# Patient Record
Sex: Female | Born: 1970 | Race: Black or African American | Hispanic: No | Marital: Single | Smoking: Current every day smoker
Health system: Southern US, Community
[De-identification: ages and names within clinical notes are randomized; demographics above are authoritative.]

## PROBLEM LIST (undated history)

## (undated) DIAGNOSIS — R0600 Dyspnea, unspecified: Secondary | ICD-10-CM

## (undated) DIAGNOSIS — J40 Bronchitis, not specified as acute or chronic: Secondary | ICD-10-CM

## (undated) DIAGNOSIS — M1990 Unspecified osteoarthritis, unspecified site: Secondary | ICD-10-CM

## (undated) DIAGNOSIS — E7800 Pure hypercholesterolemia, unspecified: Secondary | ICD-10-CM

## (undated) DIAGNOSIS — J189 Pneumonia, unspecified organism: Secondary | ICD-10-CM

## (undated) DIAGNOSIS — I10 Essential (primary) hypertension: Secondary | ICD-10-CM

## (undated) DIAGNOSIS — K219 Gastro-esophageal reflux disease without esophagitis: Secondary | ICD-10-CM

## (undated) DIAGNOSIS — J45909 Unspecified asthma, uncomplicated: Secondary | ICD-10-CM

## (undated) DIAGNOSIS — Z720 Tobacco use: Secondary | ICD-10-CM

## (undated) DIAGNOSIS — K859 Acute pancreatitis without necrosis or infection, unspecified: Secondary | ICD-10-CM

## (undated) DIAGNOSIS — F32A Depression, unspecified: Secondary | ICD-10-CM

## (undated) DIAGNOSIS — J439 Emphysema, unspecified: Secondary | ICD-10-CM

---

## 2016-07-07 ENCOUNTER — Encounter (HOSPITAL_COMMUNITY): Payer: Self-pay | Admitting: *Deleted

## 2016-07-07 ENCOUNTER — Emergency Department (HOSPITAL_COMMUNITY)
Admission: EM | Admit: 2016-07-07 | Discharge: 2016-07-07 | Disposition: A | Discharge status: Home / Self Care | Payer: Self-pay | Attending: Emergency Medicine | Admitting: Emergency Medicine

## 2016-07-07 DIAGNOSIS — I10 Essential (primary) hypertension: Secondary | ICD-10-CM | POA: Insufficient documentation

## 2016-07-07 DIAGNOSIS — F17200 Nicotine dependence, unspecified, uncomplicated: Secondary | ICD-10-CM | POA: Insufficient documentation

## 2016-07-07 DIAGNOSIS — J019 Acute sinusitis, unspecified: Secondary | ICD-10-CM | POA: Insufficient documentation

## 2016-07-07 HISTORY — DX: Essential (primary) hypertension: I10

## 2016-07-07 HISTORY — DX: Bronchitis, not specified as acute or chronic: J40

## 2016-07-07 MED ORDER — GUAIFENESIN 100 MG/5ML PO LIQD: 100.0000 mg | ORAL | 0 refills | Status: DC | PRN

## 2016-07-07 MED ORDER — AMOXICILLIN-POT CLAVULANATE 875-125 MG PO TABS: 1.0000 | ORAL_TABLET | Freq: Two times a day (BID) | ORAL | 0 refills | Status: DC

## 2016-07-07 NOTE — ED Provider Notes (Signed)
[facility]-EMERGENCY DEPT Provider Note   CSN: [id number] Arrival date & time: 07/07/16  0800     History   Chief Complaint Chief Complaint  Patient presents with  . URI    HPI Robin Fischer is a 45 y.o. female.  Patient presents to the ED with a chief complaint of cough and cold symptoms for almost a week.  Patient states that the symptoms started out like a typical cold with runny nose, mild congestion, and sore throat.  She states that they have worsened to the point that she is coughing up green sputum and having thick discharge from her nose.  She denies any fevers or chills.  She has tried taking OTC medications with some relief.  She reports that her daughter was sick with the same.   The history is provided by the patient. No language interpreter was used.    Past Medical History:  Diagnosis Date  . Bronchitis   . Hypertension     There are no active problems to display for this patient.   History reviewed. No pertinent surgical history.  OB History    No data available       Home Medications    Prior to Admission medications   Not on File    Family History History reviewed. No pertinent family history.  Social History Social History  Substance Use Topics  . Smoking status: Current Every Day Smoker  . Smokeless tobacco: Not on file  . Alcohol use Yes     Comment: beer     Allergies   Review of patient's allergies indicates no known allergies.   Review of Systems Review of Systems  Constitutional: Positive for chills. Negative for fever.  HENT: Positive for postnasal drip, rhinorrhea, sinus pressure, sneezing and sore throat.   Respiratory: Positive for cough. Negative for shortness of breath.   Cardiovascular: Negative for chest pain.  Gastrointestinal: Negative for abdominal pain, constipation, diarrhea, nausea and vomiting.  Genitourinary: Negative for dysuria.     Physical Exam Updated Vital Signs BP 148/90 (BP Location: Left Arm)    Pulse 83   Temp 98.4 F (36.9 C) (Oral)   Resp 19   SpO2 99%   Physical Exam Physical Exam  Constitutional: Pt  is oriented to person, place, and time. Appears well-developed and well-nourished. No distress.  HENT:  Head: Normocephalic and atraumatic.  Ears: Bilateral ears are remarkable for thick congestion behind TMs Nose: Mucosal edema and moderate rhinorrhea present. No epistaxis. Right sinus exhibits no maxillary sinus tenderness and no frontal sinus tenderness. Left sinus exhibits no maxillary sinus tenderness and no frontal sinus tenderness.  Mouth/Throat: Uvula is midline and mucous membranes are normal. Mucous membranes are not pale and not cyanotic. No oropharyngeal exudate, posterior oropharyngeal edema, posterior oropharyngeal erythema or tonsillar abscesses.  Eyes: Conjunctivae are normal. Pupils are equal, round, and reactive to light.  Neck: Normal range of motion and full passive range of motion without pain.  Cardiovascular: Normal rate and intact distal pulses.   Pulmonary/Chest: Effort normal and breath sounds normal. No stridor.  Clear and equal breath sounds without focal wheezes, rhonchi, rales  Abdominal: Soft. Bowel sounds are normal. There is no tenderness.  Musculoskeletal: Normal range of motion.  Lymphadenopathy:    Pthas no cervical adenopathy.  Neurological: Pt is alert and oriented to person, place, and time.  Skin: Skin is warm and dry. No rash noted. Pt is not diaphoretic.  Psychiatric: Normal mood and affect.  Nursing note and vitals  reviewed.     ED Treatments / Results  Labs (all labs ordered are listed, but only abnormal results are displayed) Labs Reviewed - No data to display  EKG  EKG Interpretation None       Radiology No results found.  Procedures Procedures (including critical care time)  Medications Ordered in ED Medications - No data to display   Initial Impression / Assessment and Plan / ED Course  I have reviewed the  triage vital signs and the nursing notes.  Pertinent labs & imaging results that were available during my care of the patient were reviewed by me and considered in my medical decision making (see chart for details).  Clinical Course    Patient with sinus tenderness, thick productive cough.  Sick x almost a week.  Will prescribe augmentin for possible development of sinusitis given length of symptoms.  Final Clinical Impressions(s) / ED Diagnoses   Final diagnoses:  Acute sinusitis, recurrence not specified, unspecified location    New Prescriptions New Prescriptions   AMOXICILLIN-CLAVULANATE (AUGMENTIN) 875-125 MG TABLET    Take 1 tablet by mouth every 12 (twelve) hours.   GUAIFENESIN (ROBITUSSIN) 100 MG/5ML LIQUID    Take 5-10 mLs (100-200 mg total) by mouth every 4 (four) hours as needed for cough.     Roxy HorsemanRobert Seddrick Flax, PA-C 07/07/16 0840    Charlynne Panderavid Hsienta Yao, MD 07/07/16 [phone]

## 2016-07-07 NOTE — ED Triage Notes (Signed)
Pt reports cough, cold and congestion x 3 weeks. Has productive cough with green sputum.

## 2016-07-07 NOTE — ED Notes (Addendum)
Pt states cold symptoms started 4-5 days ago. Pt complains of congested cough, pain in chest when coughing, pressure between eyes. Pt states her daughter had the same symptoms a week ago.

## 2016-11-30 ENCOUNTER — Ambulatory Visit (HOSPITAL_COMMUNITY)
Admission: EM | Admit: 2016-11-30 | Discharge: 2016-11-30 | Disposition: A | Discharge status: Home / Self Care | Payer: Self-pay | Attending: Family Medicine | Admitting: Family Medicine

## 2016-11-30 ENCOUNTER — Encounter (HOSPITAL_COMMUNITY): Payer: Self-pay | Admitting: *Deleted

## 2016-11-30 DIAGNOSIS — S39012A Strain of muscle, fascia and tendon of lower back, initial encounter: Secondary | ICD-10-CM

## 2016-11-30 DIAGNOSIS — S161XXA Strain of muscle, fascia and tendon at neck level, initial encounter: Secondary | ICD-10-CM

## 2016-11-30 HISTORY — DX: Unspecified asthma, uncomplicated: J45.909

## 2016-11-30 MED ORDER — CYCLOBENZAPRINE HCL 10 MG PO TABS: 10.0000 mg | ORAL_TABLET | Freq: Two times a day (BID) | ORAL | 0 refills | Status: DC | PRN

## 2016-11-30 MED ORDER — NAPROXEN 500 MG PO TABS: 500.0000 mg | ORAL_TABLET | Freq: Two times a day (BID) | ORAL | 0 refills | Status: DC
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
## 2017-01-18 ENCOUNTER — Ambulatory Visit (INDEPENDENT_AMBULATORY_CARE_PROVIDER_SITE_OTHER): Payer: Medicaid Other | Admitting: Physician Assistant

## 2017-01-18 ENCOUNTER — Encounter (INDEPENDENT_AMBULATORY_CARE_PROVIDER_SITE_OTHER): Payer: Self-pay | Admitting: Physician Assistant

## 2017-01-18 VITALS — BP 150/99 | HR 81 | Temp 98.2°F | Ht 67.0 in | Wt 211.4 lb

## 2017-01-18 DIAGNOSIS — M545 Low back pain, unspecified: Secondary | ICD-10-CM

## 2017-01-18 DIAGNOSIS — I10 Essential (primary) hypertension: Secondary | ICD-10-CM

## 2017-01-18 DIAGNOSIS — M25551 Pain in right hip: Secondary | ICD-10-CM

## 2017-01-18 DIAGNOSIS — Z114 Encounter for screening for human immunodeficiency virus [HIV]: Secondary | ICD-10-CM

## 2017-01-18 DIAGNOSIS — R319 Hematuria, unspecified: Secondary | ICD-10-CM

## 2017-01-18 DIAGNOSIS — M25561 Pain in right knee: Secondary | ICD-10-CM

## 2017-01-18 DIAGNOSIS — G8929 Other chronic pain: Secondary | ICD-10-CM

## 2017-01-18 DIAGNOSIS — E781 Pure hyperglyceridemia: Secondary | ICD-10-CM | POA: Diagnosis not present

## 2017-01-18 DIAGNOSIS — R300 Dysuria: Secondary | ICD-10-CM | POA: Diagnosis not present

## 2017-01-18 LAB — POCT URINALYSIS DIPSTICK
BILIRUBIN UA: NEGATIVE
GLUCOSE UA: NEGATIVE
Ketones, UA: NEGATIVE
NITRITE UA: NEGATIVE
Protein, UA: NEGATIVE
Spec Grav, UA: 1.025 (ref 1.010–1.025)
Urobilinogen, UA: 0.2 E.U./dL
pH, UA: 5.5 (ref 5.0–8.0)

## 2017-01-18 LAB — POCT URINE PREGNANCY: Preg Test, Ur: NEGATIVE

## 2017-01-18 MED ORDER — HYDROCHLOROTHIAZIDE 25 MG PO TABS: 25.0000 mg | ORAL_TABLET | Freq: Every day | ORAL | 1 refills | Status: DC

## 2017-01-18 MED ORDER — CIPROFLOXACIN HCL 500 MG PO TABS: 500.0000 mg | ORAL_TABLET | Freq: Two times a day (BID) | ORAL | 0 refills | Status: AC

## 2017-01-18 MED ORDER — ASPIRIN EC 81 MG PO TBEC
81.0000 mg | DELAYED_RELEASE_TABLET | Freq: Every day | ORAL | 3 refills | Status: DC
Start: 2017-01-18 — End: 2017-11-24

## 2017-01-18 MED ORDER — MELOXICAM 15 MG PO TABS
15.0000 mg | ORAL_TABLET | Freq: Every day | ORAL | 0 refills | Status: DC
Start: 2017-01-18 — End: 2017-11-24
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
## 2017-01-18 NOTE — Patient Instructions (Signed)
Managing Your Hypertension Hypertension is commonly called high blood pressure. This is when the force of your blood pressing against the walls of your arteries is too strong. Arteries are blood vessels that carry blood from your heart throughout your body. Hypertension forces the heart to work harder to pump blood, and may cause the arteries to become narrow or stiff. Having untreated or uncontrolled hypertension can cause heart attack, stroke, kidney disease, and other problems. What are blood pressure readings? A blood pressure reading consists of a higher number over a lower number. Ideally, your blood pressure should be below 120/80. The first ("top") number is called the systolic pressure. It is a measure of the pressure in your arteries as your heart beats. The second ("bottom") number is called the diastolic pressure. It is a measure of the pressure in your arteries as the heart relaxes. What does my blood pressure reading mean? Blood pressure is classified into four stages. Based on your blood pressure reading, your health care provider may use the following stages to determine what type of treatment you need, if any. Systolic pressure and diastolic pressure are measured in a unit called mm Hg. Normal   Systolic pressure: below 120.  Diastolic pressure: below 80. Elevated   Systolic pressure: 120-129.  Diastolic pressure: below 80. Hypertension stage 1     Diastolic pressure: 80-89. Hypertension stage 2   Systolic pressure: 140 or above.  Diastolic pressure: 90 or above. What health risks are associated with hypertension? Managing your hypertension is an important responsibility. Uncontrolled hypertension can lead to:  A heart attack.  A stroke.  A weakened blood vessel (aneurysm).  Heart failure.  Kidney damage.  Eye damage.  Metabolic syndrome.  Memory and concentration problems. What changes can I make to manage my hypertension? Eating and drinking   Eat a  diet that is high in fiber and potassium, and low in salt (sodium), added sugar, and fat. An example eating plan is called the DASH (Dietary Approaches to Stop Hypertension) diet. To eat this way:  Eat plenty of fresh fruits and vegetables. Try to fill half of your plate at each meal with fruits and vegetables.  Eat whole grains, such as whole wheat pasta, brown rice, or whole grain bread. Fill about one quarter of your plate with whole grains.  Eat low-fat diary products.  Avoid fatty cuts of meat, processed or cured meats, and poultry with skin. Fill about one quarter of your plate with lean proteins such as fish, chicken without skin, beans, eggs, and tofu.  Avoid premade and processed foods. These tend to be higher in sodium, added sugar, and fat.     Lifestyle   Work with your health care provider to maintain a healthy body weight, or to lose weight. Ask what an ideal weight is for you.  Get at least 30 minutes of exercise that causes your heart to beat faster (aerobic exercise) most days of the week. Activities may include walking, swimming, or biking.       Monitoring   Monitor your blood pressure at home as told by your health care provider. Your personal target blood pressure may vary depending on your medical conditions, your age, and other factors.  Have your blood pressure checked regularly, as often as told by your health care provider. Working with your health care provider   Review all the medicines you take with your health care provider because there may be side effects or interactions.  Talk with your health   care provider about your diet, exercise habits, and other lifestyle factors that may be contributing to hypertension.  Visit your health care provider regularly. Your health care provider can help you create and adjust your plan for managing hypertension. Will I need medicine to control my blood pressure? Your health care provider may prescribe medicine if  lifestyle changes are not enough to get your blood pressure under control, and if:  Your systolic blood pressure is 130 or higher.  Your diastolic blood pressure is 80 or higher. Take medicines only as told by your health care provider. Follow the directions carefully. Blood pressure medicines must be taken as prescribed. The medicine does not work as well when you skip doses. Skipping doses also puts you at risk for problems. Contact a health care provider if:  You think you are having a reaction to medicines you have taken.  You have repeated (recurrent) headaches.  You feel dizzy.  You have swelling in your ankles.  You have trouble with your vision. Get help right away if:  You develop a severe headache or confusion.  You have unusual weakness or numbness, or you feel faint.  You have severe pain in your chest or abdomen.  You vomit repeatedly.  You have trouble breathing. Summary  Hypertension is when the force of blood pumping through your arteries is too strong. If this condition is not controlled, it may put you at risk for serious complications.  Your personal target blood pressure may vary depending on your medical conditions, your age, and other factors. For most people, a normal blood pressure is less than 120/80.  Hypertension is managed by lifestyle changes, medicines, or both. Lifestyle changes include weight loss, eating a healthy, low-sodium diet, exercising more, and limiting alcohol. This information is not intended to replace advice given to you by your health care provider. Make sure you discuss any questions you have with your health care provider. Document Released: 05/16/2012 Document Revised: 07/20/2016 Document Reviewed: 07/20/2016 Elsevier Interactive Patient Education  2017 [employer]  

## 2017-01-19 LAB — CBC WITH DIFFERENTIAL/PLATELET
BASOS ABS: 0 10*3/uL (ref 0.0–0.2)
Basos: 1 %
EOS (ABSOLUTE): 0.3 10*3/uL (ref 0.0–0.4)
Eos: 3 %
HEMATOCRIT: 44.5 % (ref 34.0–46.6)
Hemoglobin: 14.5 g/dL (ref 11.1–15.9)
Immature Grans (Abs): 0 10*3/uL (ref 0.0–0.1)
Immature Granulocytes: 0 %
LYMPHS ABS: 3.8 10*3/uL — AB (ref 0.7–3.1)
Lymphs: 45 %
MCH: 25 pg — AB (ref 26.6–33.0)
MCHC: 32.6 g/dL (ref 31.5–35.7)
MCV: 77 fL — ABNORMAL LOW (ref 79–97)
MONOCYTES: 5 %
MONOS ABS: 0.4 10*3/uL (ref 0.1–0.9)
NEUTROS ABS: 3.9 10*3/uL (ref 1.4–7.0)
Neutrophils: 46 %
Platelets: 293 10*3/uL (ref 150–379)
RBC: 5.79 x10E6/uL — ABNORMAL HIGH (ref 3.77–5.28)
RDW: 15.4 % (ref 12.3–15.4)
WBC: 8.4 10*3/uL (ref 3.4–10.8)

## 2017-01-19 LAB — LIPID PANEL
CHOL/HDL RATIO: 5.1 ratio — AB (ref 0.0–4.4)
Cholesterol, Total: 299 mg/dL — ABNORMAL HIGH (ref 100–199)
HDL: 59 mg/dL (ref >39)
LDL Calculated: 204 mg/dL — ABNORMAL HIGH (ref 0–99)
Triglycerides: 180 mg/dL — ABNORMAL HIGH (ref 0–149)
VLDL Cholesterol Cal: 36 mg/dL (ref 5–40)

## 2017-01-19 LAB — COMPREHENSIVE METABOLIC PANEL
ALBUMIN: 4.6 g/dL (ref 3.5–5.5)
ALK PHOS: 93 IU/L (ref 39–117)
ALT: 16 IU/L (ref 0–32)
AST: 16 IU/L (ref 0–40)
Albumin/Globulin Ratio: 1.8 (ref 1.2–2.2)
BILIRUBIN TOTAL: 0.2 mg/dL (ref 0.0–1.2)
BUN/Creatinine Ratio: 15 (ref 9–23)
BUN: 12 mg/dL (ref 6–24)
CHLORIDE: 103 mmol/L (ref 96–106)
CO2: 23 mmol/L (ref 18–29)
Calcium: 9.9 mg/dL (ref 8.7–10.2)
Creatinine, Ser: 0.78 mg/dL (ref 0.57–1.00)
GFR calc Af Amer: 105 mL/min/{1.73_m2} (ref >59)
GFR, EST NON AFRICAN AMERICAN: 91 mL/min/{1.73_m2} (ref >59)
GLOBULIN, TOTAL: 2.5 g/dL (ref 1.5–4.5)
Glucose: 94 mg/dL (ref 65–99)
POTASSIUM: 4.1 mmol/L (ref 3.5–5.2)
SODIUM: 143 mmol/L (ref 134–144)
Total Protein: 7.1 g/dL (ref 6.0–8.5)

## 2017-01-19 LAB — URINALYSIS

## 2017-01-19 LAB — HIV ANTIBODY (ROUTINE TESTING W REFLEX): HIV SCREEN 4TH GENERATION: NONREACTIVE

## 2017-01-20 ENCOUNTER — Other Ambulatory Visit (INDEPENDENT_AMBULATORY_CARE_PROVIDER_SITE_OTHER): Payer: Self-pay | Admitting: Physician Assistant

## 2017-01-20 DIAGNOSIS — E785 Hyperlipidemia, unspecified: Secondary | ICD-10-CM

## 2017-01-20 LAB — URINE CULTURE

## 2017-01-20 MED ORDER — ATORVASTATIN CALCIUM 40 MG PO TABS: 40.0000 mg | ORAL_TABLET | Freq: Every day | ORAL | 3 refills | Status: DC

## 2017-01-20 NOTE — Progress Notes (Signed)
Elevated LDL 204

## 2017-01-24 ENCOUNTER — Telehealth (INDEPENDENT_AMBULATORY_CARE_PROVIDER_SITE_OTHER): Payer: Self-pay | Admitting: Physician Assistant

## 2017-01-24 NOTE — Telephone Encounter (Signed)
Patient left voicemail stated her Ultrasound appt was canceled due to her insurance not covering it.  Please follow up with patient.

## 2017-01-25 ENCOUNTER — Ambulatory Visit (HOSPITAL_COMMUNITY): Payer: Medicaid Other

## 2017-02-15 ENCOUNTER — Ambulatory Visit (INDEPENDENT_AMBULATORY_CARE_PROVIDER_SITE_OTHER): Payer: Medicaid Other | Admitting: Physician Assistant

## 2017-03-11 ENCOUNTER — Encounter (HOSPITAL_COMMUNITY): Payer: Self-pay | Admitting: Emergency Medicine

## 2017-03-11 ENCOUNTER — Emergency Department (HOSPITAL_COMMUNITY)
Admission: EM | Admit: 2017-03-11 | Discharge: 2017-03-11 | Disposition: A | Discharge status: Home / Self Care | Payer: Medicaid Other | Attending: Emergency Medicine | Admitting: Emergency Medicine

## 2017-03-11 DIAGNOSIS — F17210 Nicotine dependence, cigarettes, uncomplicated: Secondary | ICD-10-CM | POA: Insufficient documentation

## 2017-03-11 DIAGNOSIS — N3000 Acute cystitis without hematuria: Secondary | ICD-10-CM

## 2017-03-11 DIAGNOSIS — Z79899 Other long term (current) drug therapy: Secondary | ICD-10-CM | POA: Insufficient documentation

## 2017-03-11 DIAGNOSIS — R102 Pelvic and perineal pain: Secondary | ICD-10-CM

## 2017-03-11 HISTORY — DX: Pure hypercholesterolemia, unspecified: E78.00

## 2017-03-11 LAB — URINALYSIS, ROUTINE W REFLEX MICROSCOPIC
BACTERIA UA: NONE SEEN
BILIRUBIN URINE: NEGATIVE
GLUCOSE, UA: NEGATIVE mg/dL
Ketones, ur: 5 mg/dL — AB
NITRITE: NEGATIVE
Protein, ur: 30 mg/dL — AB
SPECIFIC GRAVITY, URINE: 1.024 (ref 1.005–1.030)
Squamous Epithelial / LPF: NONE SEEN
pH: 5 (ref 5.0–8.0)

## 2017-03-11 LAB — WET PREP, GENITAL
Clue Cells Wet Prep HPF POC: NONE SEEN
SPERM: NONE SEEN
Trich, Wet Prep: NONE SEEN
YEAST WET PREP: NONE SEEN

## 2017-03-11 LAB — POC URINE PREG, ED: Preg Test, Ur: NEGATIVE

## 2017-03-11 MED ORDER — CEPHALEXIN 500 MG PO CAPS: 1000.0000 mg | ORAL_CAPSULE | Freq: Two times a day (BID) | ORAL | 0 refills | Status: DC

## 2017-03-11 NOTE — ED Provider Notes (Signed)
[facility]-EMERGENCY DEPT Provider Note   CSN: [id number] Arrival date & time: 03/11/17  1325     History   Chief Complaint Chief Complaint  Patient presents with  . Vaginal Pain    HPI Robin Fischer is a 46 y.o. female.  HPI Patient was vaginal pain for approximately one month. Sharp stabbing pains and burning pains. She denies pain with urination but then does quantify that things seem to hurt more after she urinates. She denies vaginal discharge or bleeding. No associated abdominal pain. Patient reports last sexual activity was approximately 1 year ago. Last visit to gynecologist approximately 3 years ago. Denies history of cervical cancer other GYN pathology. Past Medical History:  Diagnosis Date  . Asthma   . Bronchitis   . High cholesterol   . Hypertension     There are no active problems to display for this patient.   History reviewed. No pertinent surgical history.  OB History    No data available       Home Medications    Prior to Admission medications   Medication Sig Start Date End Date Taking? Authorizing Provider  aspirin EC 81 MG tablet Take 1 tablet (81 mg total) by mouth daily. 01/18/17   Loletta SpecterGomez, Roger David, PA-C  atorvastatin (LIPITOR) 40 MG tablet Take 1 tablet (40 mg total) by mouth daily. 01/20/17   Loletta SpecterGomez, Roger David, PA-C  cephALEXin (KEFLEX) 500 MG capsule Take 2 capsules (1,000 mg total) by mouth 2 (two) times daily. 03/11/17   Arby BarrettePfeiffer, Rebbie Lauricella, MD  hydrochlorothiazide (HYDRODIURIL) 25 MG tablet Take 1 tablet (25 mg total) by mouth daily. Take on tablet in the morning. 01/18/17   Loletta SpecterGomez, Roger David, PA-C  meloxicam (MOBIC) 15 MG tablet Take 1 tablet (15 mg total) by mouth daily. 01/18/17   Loletta SpecterGomez, Roger David, PA-C    Family History No family history on file.  Social History Social History  Substance Use Topics  . Smoking status: Current Every Day Smoker  . Smokeless tobacco: Never Used  . Alcohol use Yes     Comment: beer     Allergies     Patient has no known allergies.   Review of Systems Review of Systems 10 Systems reviewed and are negative for acute change except as noted in the HPI.   Physical Exam Updated Vital Signs BP 132/72 (BP Location: Left Arm)   Pulse 75   Temp 98 F (36.7 C) (Oral)   Resp 20   SpO2 100%   Physical Exam  Constitutional: She is oriented to person, place, and time. She appears well-developed and well-nourished. No distress.  HENT:  Head: Normocephalic and atraumatic.  Eyes: EOM are normal.  Cardiovascular: Normal rate, regular rhythm, normal heart sounds and intact distal pulses.   Pulmonary/Chest: Effort normal and breath sounds normal.  Abdominal: Soft. Bowel sounds are normal. She exhibits no distension. There is no tenderness.  Genitourinary:  Genitourinary Comments: Normal external female vaginal tissues. No lesions. No erythema. Mucous membranes normal appearance. Speculum exam: Walls of vagina normal appearance. No discharge in the vaginal vault. No discharge from cervix. Slight cystic appearance of the inferior cervical opening. Patient did notably however have a lot of pain with insertion of speculum. Bimanual exam no cervical motion tenderness.  Musculoskeletal: Normal range of motion. She exhibits no edema.  Neurological: She is alert and oriented to person, place, and time. She exhibits normal muscle tone. Coordination normal.  Skin: Skin is warm and dry.  Psychiatric: She has a normal  mood and affect.     ED Treatments / Results  Labs (all labs ordered are listed, but only abnormal results are displayed) Labs Reviewed  URINALYSIS, ROUTINE W REFLEX MICROSCOPIC - Abnormal; Notable for the following:       Result Value   Hgb urine dipstick MODERATE (*)    Ketones, ur 5 (*)    Protein, ur 30 (*)    Leukocytes, UA SMALL (*)    All other components within normal limits  WET PREP, GENITAL  URINE CULTURE  POC URINE PREG, ED  GC/CHLAMYDIA PROBE AMP ([place]) NOT AT  [facility]    EKG  EKG Interpretation None       Radiology No results found.  Procedures Procedures (including critical care time)  Medications Ordered in ED Medications - No data to display   Initial Impression / Assessment and Plan / ED Course  I have reviewed the triage vital signs and the nursing notes.  Pertinent labs & imaging results that were available during my care of the patient were reviewed by me and considered in my medical decision making (see chart for details).      Final Clinical Impressions(s) / ED Diagnoses   Final diagnoses:  Vaginal pain  Acute cystitis without hematuria  Patient is counseled on necessary follow-up with gynecology. External exam and internal exam did not suggest infectious etiology. Patient denies sexual activity for almost a year. Consideration is for atrophic vaginitis. Urine does test mildly positive and patient will be treated for UTI as she describes symptoms. She also must have follow-up for cervical cancer screening. Last exam she estimates to be 3 years ago. We have reviewed this and she knows importance of follow-up plan  New Prescriptions New Prescriptions   CEPHALEXIN (KEFLEX) 500 MG CAPSULE    Take 2 capsules (1,000 mg total) by mouth 2 (two) times daily.     Arby Barrette, MD 03/11/17 [phone]

## 2017-03-11 NOTE — ED Notes (Signed)
Patient is alert and oriented x3.  She was given DC instructions and follow up visit instructions.  Patient gave verbal understanding. She was DC ambulatory under her own power to home.  V/S stable.  He was not showing any signs of distress on DC 

## 2017-03-11 NOTE — ED Triage Notes (Signed)
Pt from home via EMS with vaginal pain x 1 month that she describes as "needles in her coochie".  Pt states she was told by her PCP that she has hematuria.

## 2017-03-12 LAB — URINE CULTURE: CULTURE: NO GROWTH

## 2017-03-13 LAB — GC/CHLAMYDIA PROBE AMP (~~LOC~~) NOT AT ARMC
CHLAMYDIA, DNA PROBE: NEGATIVE
Neisseria Gonorrhea: NEGATIVE

## 2017-09-13 ENCOUNTER — Other Ambulatory Visit: Payer: Self-pay

## 2017-09-13 ENCOUNTER — Emergency Department (HOSPITAL_COMMUNITY): Payer: Self-pay

## 2017-09-13 ENCOUNTER — Encounter (HOSPITAL_COMMUNITY): Payer: Self-pay | Admitting: *Deleted

## 2017-09-13 ENCOUNTER — Emergency Department (HOSPITAL_COMMUNITY)
Admission: EM | Admit: 2017-09-13 | Discharge: 2017-09-14 | Disposition: A | Discharge status: Home / Self Care | Payer: Self-pay | Attending: Emergency Medicine | Admitting: Emergency Medicine

## 2017-09-13 DIAGNOSIS — J45909 Unspecified asthma, uncomplicated: Secondary | ICD-10-CM | POA: Insufficient documentation

## 2017-09-13 DIAGNOSIS — F17200 Nicotine dependence, unspecified, uncomplicated: Secondary | ICD-10-CM | POA: Insufficient documentation

## 2017-09-13 DIAGNOSIS — I10 Essential (primary) hypertension: Secondary | ICD-10-CM | POA: Insufficient documentation

## 2017-09-13 DIAGNOSIS — R1031 Right lower quadrant pain: Secondary | ICD-10-CM

## 2017-09-13 DIAGNOSIS — E876 Hypokalemia: Secondary | ICD-10-CM

## 2017-09-13 DIAGNOSIS — N3001 Acute cystitis with hematuria: Secondary | ICD-10-CM

## 2017-09-13 LAB — COMPREHENSIVE METABOLIC PANEL
ALT: 33 U/L (ref 14–54)
AST: 54 U/L — ABNORMAL HIGH (ref 15–41)
Albumin: 3.6 g/dL (ref 3.5–5.0)
Alkaline Phosphatase: 115 U/L (ref 38–126)
Anion gap: 14 (ref 5–15)
BUN: <5 mg/dL — ABNORMAL LOW (ref 6–20)
CO2: 26 mmol/L (ref 22–32)
Calcium: 9 mg/dL (ref 8.9–10.3)
Chloride: 98 mmol/L — ABNORMAL LOW (ref 101–111)
Creatinine, Ser: 0.97 mg/dL (ref 0.44–1.00)
GFR calc Af Amer: >60 mL/min (ref >60)
GFR calc non Af Amer: >60 mL/min (ref >60)
Glucose, Bld: 92 mg/dL (ref 65–99)
Potassium: 2.5 mmol/L — CL (ref 3.5–5.1)
Sodium: 138 mmol/L (ref 135–145)
Total Bilirubin: 1.1 mg/dL (ref 0.3–1.2)
Total Protein: 6.9 g/dL (ref 6.5–8.1)

## 2017-09-13 LAB — WET PREP, GENITAL
CLUE CELLS WET PREP: NONE SEEN
Sperm: NONE SEEN
TRICH WET PREP: NONE SEEN
Yeast Wet Prep HPF POC: NONE SEEN

## 2017-09-13 LAB — MAGNESIUM: MAGNESIUM: 2.1 mg/dL (ref 1.7–2.4)

## 2017-09-13 LAB — URINALYSIS, ROUTINE W REFLEX MICROSCOPIC
Bacteria, UA: NONE SEEN
Bilirubin Urine: NEGATIVE
Glucose, UA: NEGATIVE mg/dL
Ketones, ur: NEGATIVE mg/dL
Nitrite: NEGATIVE
Protein, ur: NEGATIVE mg/dL
RBC / HPF: NONE SEEN RBC/hpf (ref 0–5)
Specific Gravity, Urine: 1.003 — ABNORMAL LOW (ref 1.005–1.030)
pH: 6 (ref 5.0–8.0)

## 2017-09-13 LAB — CBC
HCT: 44.6 % (ref 36.0–46.0)
Hemoglobin: 15.3 g/dL — ABNORMAL HIGH (ref 12.0–15.0)
MCH: 27.9 pg (ref 26.0–34.0)
MCHC: 34.3 g/dL (ref 30.0–36.0)
MCV: 81.2 fL (ref 78.0–100.0)
Platelets: 327 10*3/uL (ref 150–400)
RBC: 5.49 MIL/uL — ABNORMAL HIGH (ref 3.87–5.11)
RDW: 17.5 % — ABNORMAL HIGH (ref 11.5–15.5)
WBC: 7.7 10*3/uL (ref 4.0–10.5)

## 2017-09-13 LAB — I-STAT BETA HCG BLOOD, ED (MC, WL, AP ONLY): I-stat hCG, quantitative: <5 m[IU]/mL (ref <5)

## 2017-09-13 LAB — LIPASE, BLOOD: Lipase: 50 U/L (ref 11–51)

## 2017-09-13 MED ORDER — DOXYCYCLINE HYCLATE 100 MG PO CAPS: 100.0000 mg | ORAL_CAPSULE | Freq: Two times a day (BID) | ORAL | 0 refills | Status: DC

## 2017-09-13 MED ORDER — MORPHINE SULFATE (PF) 4 MG/ML IV SOLN
4.0000 mg | Freq: Once | INTRAVENOUS | Status: AC
  Administered 2017-09-13: 4 mg via INTRAVENOUS
  Filled 2017-09-13: qty 1

## 2017-09-13 MED ORDER — CEFTRIAXONE SODIUM 250 MG IJ SOLR
250.0000 mg | Freq: Once | INTRAMUSCULAR | Status: AC
  Administered 2017-09-13: 250 mg via INTRAMUSCULAR
  Filled 2017-09-13: qty 250

## 2017-09-13 MED ORDER — IOPAMIDOL (ISOVUE-300) INJECTION 61%
INTRAVENOUS | Status: AC
  Administered 2017-09-13: 100 mL
  Filled 2017-09-13: qty 100

## 2017-09-13 MED ORDER — LIDOCAINE HCL (PF) 1 % IJ SOLN
INTRAMUSCULAR | Status: AC
  Filled 2017-09-13: qty 5

## 2017-09-13 MED ORDER — POTASSIUM CHLORIDE 10 MEQ/100ML IV SOLN
10.0000 meq | Freq: Once | INTRAVENOUS | Status: AC
  Administered 2017-09-13: 10 meq via INTRAVENOUS
  Filled 2017-09-13: qty 100

## 2017-09-13 MED ORDER — AZITHROMYCIN 250 MG PO TABS
1000.0000 mg | ORAL_TABLET | Freq: Once | ORAL | Status: AC
  Administered 2017-09-13: 1000 mg via ORAL
  Filled 2017-09-13: qty 4

## 2017-09-13 MED ORDER — ONDANSETRON HCL 4 MG/2ML IJ SOLN
4.0000 mg | Freq: Once | INTRAMUSCULAR | Status: AC
  Administered 2017-09-13: 4 mg via INTRAVENOUS
  Filled 2017-09-13: qty 2

## 2017-09-13 MED ORDER — POTASSIUM CHLORIDE 10 MEQ/100ML IV SOLN
10.0000 meq | Freq: Once | INTRAVENOUS | Status: AC
Start: 2017-09-13 — End: 2017-09-13
  Administered 2017-09-13: 10 meq via INTRAVENOUS
  Filled 2017-09-13: qty 100

## 2017-09-13 MED ORDER — POTASSIUM CHLORIDE CRYS ER 20 MEQ PO TBCR
60.0000 meq | EXTENDED_RELEASE_TABLET | Freq: Once | ORAL | Status: AC
  Administered 2017-09-13: 60 meq via ORAL
  Filled 2017-09-13: qty 3

## 2017-09-13 MED ORDER — SODIUM CHLORIDE 0.9 % IV BOLUS (SEPSIS)
1000.0000 mL | Freq: Once | INTRAVENOUS | Status: AC
Start: 2017-09-13 — End: 2017-09-13
  Administered 2017-09-13: 1000 mL via INTRAVENOUS

## 2017-09-13 MED ORDER — MAGNESIUM SULFATE IN D5W 1-5 GM/100ML-% IV SOLN
1.0000 g | Freq: Once | INTRAVENOUS | Status: AC
  Administered 2017-09-13: 1 g via INTRAVENOUS
  Filled 2017-09-13: qty 100

## 2017-09-13 MED ORDER — KETOROLAC TROMETHAMINE 30 MG/ML IJ SOLN
30.0000 mg | Freq: Once | INTRAMUSCULAR | Status: AC
  Administered 2017-09-13: 30 mg via INTRAVENOUS
  Filled 2017-09-13: qty 1

## 2017-09-13 MED ORDER — CEPHALEXIN 500 MG PO CAPS: 500.0000 mg | ORAL_CAPSULE | Freq: Two times a day (BID) | ORAL | 0 refills | Status: AC

## 2017-09-13 MED ORDER — POTASSIUM CHLORIDE CRYS ER 20 MEQ PO TBCR: 40.0000 meq | EXTENDED_RELEASE_TABLET | Freq: Every day | ORAL | 0 refills | Status: DC

## 2017-09-13 MED ORDER — TRAMADOL HCL 50 MG PO TABS: 50.0000 mg | ORAL_TABLET | Freq: Four times a day (QID) | ORAL | 0 refills | Status: DC | PRN

## 2017-09-13 NOTE — ED Provider Notes (Signed)
Received sign out at beginning of shift.  Pt here with abd pain, n/v/d x 5 days.  Labs remarkable for hypokalemia with K+ 2.5 without EKG changes.  Pt currently receiving IV and PO replenishment along with Mag replenishment.  Stable for discharge once IV meds complete.   11:38 PM Pt successfully received all of her IV medication and able to tolerates her PO medication.  She is stable for discharge.  Return precaution given.   BP (!) 154/74   Pulse 79   Temp 98.3 F (36.8 C) (Oral)   Resp 20   SpO2 98%   Results for orders placed or performed during the hospital encounter of 09/13/17  Wet prep, genital  Result Value Ref Range   Yeast Wet Prep HPF POC NONE SEEN NONE SEEN   Trich, Wet Prep NONE SEEN NONE SEEN   Clue Cells Wet Prep HPF POC NONE SEEN NONE SEEN   WBC, Wet Prep HPF POC FEW (A) NONE SEEN   Sperm NONE SEEN   Lipase, blood  Result Value Ref Range   Lipase 50 11 - 51 U/L  Comprehensive metabolic panel  Result Value Ref Range   Sodium 138 135 - 145 mmol/L   Potassium 2.5 (LL) 3.5 - 5.1 mmol/L   Chloride 98 (L) 101 - 111 mmol/L   CO2 26 22 - 32 mmol/L   Glucose, Bld 92 65 - 99 mg/dL   BUN <5 (L) 6 - 20 mg/dL   Creatinine, Ser 9.52 0.44 - 1.00 mg/dL   Calcium 9.0 8.9 - 84.1 mg/dL   Total Protein 6.9 6.5 - 8.1 g/dL   Albumin 3.6 3.5 - 5.0 g/dL   AST 54 (H) 15 - 41 U/L   ALT 33 14 - 54 U/L   Alkaline Phosphatase 115 38 - 126 U/L   Total Bilirubin 1.1 0.3 - 1.2 mg/dL   GFR calc non Af Amer >60 >60 mL/min   GFR calc Af Amer >60 >60 mL/min   Anion gap 14 5 - 15  CBC  Result Value Ref Range   WBC 7.7 4.0 - 10.5 K/uL   RBC 5.49 (H) 3.87 - 5.11 MIL/uL   Hemoglobin 15.3 (H) 12.0 - 15.0 g/dL   HCT 32.4 40.1 - 02.7 %   MCV 81.2 78.0 - 100.0 fL   MCH 27.9 26.0 - 34.0 pg   MCHC 34.3 30.0 - 36.0 g/dL   RDW 25.3 (H) 66.4 - 40.3 %   Platelets 327 150 - 400 K/uL  Urinalysis, Routine w reflex microscopic  Result Value Ref Range   Color, Urine YELLOW YELLOW   APPearance CLEAR  CLEAR   Specific Gravity, Urine 1.003 (L) 1.005 - 1.030   pH 6.0 5.0 - 8.0   Glucose, UA NEGATIVE NEGATIVE mg/dL   Hgb urine dipstick SMALL (A) NEGATIVE   Bilirubin Urine NEGATIVE NEGATIVE   Ketones, ur NEGATIVE NEGATIVE mg/dL   Protein, ur NEGATIVE NEGATIVE mg/dL   Nitrite NEGATIVE NEGATIVE   Leukocytes, UA MODERATE (A) NEGATIVE   RBC / HPF NONE SEEN 0 - 5 RBC/hpf   WBC, UA 6-30 0 - 5 WBC/hpf   Bacteria, UA NONE SEEN NONE SEEN   Squamous Epithelial / LPF 0-5 (A) NONE SEEN  Magnesium  Result Value Ref Range   Magnesium 2.1 1.7 - 2.4 mg/dL  I-Stat beta hCG blood, ED  Result Value Ref Range   I-stat hCG, quantitative <5.0 <5 mIU/mL   Comment 3  Ct Abdomen Pelvis W Contrast  Result Date: 09/13/2017 CLINICAL DATA:  Abdominal pain EXAM: CT ABDOMEN AND PELVIS WITH CONTRAST TECHNIQUE: Multidetector CT imaging of the abdomen and pelvis was performed using the standard protocol following bolus administration of intravenous contrast. CONTRAST:  100mL ISOVUE-300 IOPAMIDOL (ISOVUE-300) INJECTION 61% COMPARISON:  None. FINDINGS: Lower chest: Lung bases are clear. No effusions. Heart is normal size. Hepatobiliary: Fatty infiltration of the liver. No focal abnormality or biliary ductal dilatation. Gallbladder unremarkable. Pancreas: No focal abnormality or ductal dilatation. Spleen: No focal abnormality.  Normal size. Adrenals/Urinary Tract: No adrenal abnormality. No focal renal abnormality. No stones or hydronephrosis. Urinary bladder is unremarkable. Stomach/Bowel: Normal appendix. Stomach, large and small bowel grossly unremarkable. Vascular/Lymphatic: Scattered aortic and iliac calcifications. No evidence of aneurysm or adenopathy. Reproductive: Uterus and adnexa unremarkable. 2 cm fluid collection noted in the lower pelvis, possibly representing a vaginal cyst. Other: No free fluid or free air. Musculoskeletal: No acute bony abnormality. IMPRESSION: Diffuse fatty infiltration of the liver.  Normal appendix. Scattered aortic atherosclerosis, advanced for patient's age. No acute findings in the abdomen or pelvis. Electronically Signed   By: Charlett NoseKevin  Dover M.D.   On: 09/13/2017 15:34      Fayrene Helperran, Lorell Thibodaux, PA-C 09/13/17 [phone]    Loren RacerYelverton, David, MD 09/13/17 [phone]

## 2017-09-13 NOTE — ED Provider Notes (Signed)
[facility] EMERGENCY DEPARTMENT Provider Note   CSN: [id number] Arrival date & time: 09/13/17  0818     History   Chief Complaint Chief Complaint  Patient presents with  . Abdominal Pain  . Emesis    HPI Robin Fischer is a 47 y.o. female with no pertinent past medical history of presents for evaluation of nausea, non-bilious and non-bloody emesis, diarrhea and abdominal pain for 5 days. Abdominal pain described as constant and sharp and located to lower and midline abdomen worse in the right side. Pain is worse with palpation and movement. Also reporting "stabbing" sharp pain to vaginal area as if something was going to come out, this is worse with urination and palpation. Was able to tolerate ginger ale yesterday but no food. . No fevers, chest pain, shortness of breath, cough, vaginal discharge or bleeding, melena or hematochezia. No history of abdominal surgeries. No sick contacts. Sexually active with men only, one partner in last year. No condom use. No h/o STD in the past.   HPI  Past Medical History:  Diagnosis Date  . Asthma   . Bronchitis   . High cholesterol   . Hypertension     There are no active problems to display for this patient.   History reviewed. No pertinent surgical history.  OB History    No data available       Home Medications    Prior to Admission medications   Medication Sig Start Date End Date Taking? Authorizing Provider  aspirin EC 81 MG tablet Take 1 tablet (81 mg total) by mouth daily. Patient not taking: Reported on 09/13/2017 01/18/17   Loletta Specter, PA-C  atorvastatin (LIPITOR) 40 MG tablet Take 1 tablet (40 mg total) by mouth daily. Patient not taking: Reported on 09/13/2017 01/20/17   Loletta Specter, PA-C  cephALEXin (KEFLEX) 500 MG capsule Take 1 capsule (500 mg total) by mouth 2 (two) times daily for 5 days. 09/13/17 09/18/17  Liberty Handy, PA-C  doxycycline (VIBRAMYCIN) 100 MG capsule Take 1 capsule  (100 mg total) by mouth 2 (two) times daily. 09/13/17   Liberty Handy, PA-C  hydrochlorothiazide (HYDRODIURIL) 25 MG tablet Take 1 tablet (25 mg total) by mouth daily. Take on tablet in the morning. Patient not taking: Reported on 09/13/2017 01/18/17   Loletta Specter, PA-C  meloxicam (MOBIC) 15 MG tablet Take 1 tablet (15 mg total) by mouth daily. Patient not taking: Reported on 09/13/2017 01/18/17   Loletta Specter, PA-C  potassium chloride SA (K-DUR,KLOR-CON) 20 MEQ tablet Take 2 tablets (40 mEq total) by mouth daily for 7 doses. 09/13/17 09/20/17  Liberty Handy, PA-C  traMADol (ULTRAM) 50 MG tablet Take 1 tablet (50 mg total) by mouth every 6 (six) hours as needed. 09/13/17   Liberty Handy, PA-C    Family History No family history on file.  Social History Social History   Tobacco Use  . Smoking status: Current Every Day Smoker  . Smokeless tobacco: Never Used  Substance Use Topics  . Alcohol use: Yes    Comment: beer  . Drug use: No     Allergies   Patient has no known allergies.   Review of Systems Review of Systems  Constitutional: Positive for appetite change and chills.  Gastrointestinal: Positive for abdominal pain, diarrhea, nausea and vomiting.  Genitourinary: Positive for dysuria.  All other systems reviewed and are negative.    Physical Exam Updated Vital Signs BP (!) 154/76 (  BP Location: Right Arm)   Pulse 69   Temp 98.3 F (36.8 C) (Oral)   Resp 12   SpO2 99%   Physical Exam  Constitutional: She is oriented to person, place, and time. She appears well-developed and well-nourished. No distress.  NAD. Nontoxic.  HENT:  Head: Normocephalic and atraumatic.  Right Ear: External ear normal.  Left Ear: External ear normal.  Nose: Nose normal.  Dry lips.  Eyes: Conjunctivae and EOM are normal. No scleral icterus.  Neck: Normal range of motion. Neck supple.  Cardiovascular: Normal rate, regular rhythm and normal heart sounds.  No murmur  heard. Pulmonary/Chest: Effort normal and breath sounds normal. She has no wheezes.  Abdominal: Soft. Normal appearance and bowel sounds are normal. There is tenderness in the right lower quadrant, periumbilical area and left upper quadrant.  Diffuse lower abdominal pain, worse in suprapubic and right lower quadrant. No guarding, rigidity or rebound.  No CVA tenderness.  Genitourinary: Pelvic exam was performed with patient prone. Cervix exhibits motion tenderness. There is tenderness in the vagina.  Genitourinary Comments:  EMT as chaperone.  External genitalia normal without erythema, edema, tenderness, discharge or lesions. No groin lymphadenopathy.  +Patient with significant discomfort with speculum and bimanual exam, only able to advance speculum 4-5 cm due to pain.  Visualized vaginal mucosa pink without discharge or lesions. Unable to visualize cervix. No obvious masses, lesions or sores to vagina mucosa that was visualized. +Positive CMT, unable to palpate adnexa due to patient discomfort.   Musculoskeletal: Normal range of motion. She exhibits no deformity.  Neurological: She is alert and oriented to person, place, and time.  Skin: Skin is warm and dry. Capillary refill takes less than 2 seconds.  Psychiatric: She has a normal mood and affect. Her behavior is normal. Judgment and thought content normal.  Nursing note and vitals reviewed.    ED Treatments / Results  Labs (all labs ordered are listed, but only abnormal results are displayed) Labs Reviewed  WET PREP, GENITAL - Abnormal; Notable for the following components:      Result Value   WBC, Wet Prep HPF POC FEW (*)    All other components within normal limits  COMPREHENSIVE METABOLIC PANEL - Abnormal; Notable for the following components:   Potassium 2.5 (*)    Chloride 98 (*)    BUN <5 (*)    AST 54 (*)    All other components within normal limits  CBC - Abnormal; Notable for the following components:   RBC 5.49 (*)     Hemoglobin 15.3 (*)    RDW 17.5 (*)    All other components within normal limits  URINALYSIS, ROUTINE W REFLEX MICROSCOPIC - Abnormal; Notable for the following components:   Specific Gravity, Urine 1.003 (*)    Hgb urine dipstick SMALL (*)    Leukocytes, UA MODERATE (*)    Squamous Epithelial / LPF 0-5 (*)    All other components within normal limits  LIPASE, BLOOD  MAGNESIUM  I-STAT BETA HCG BLOOD, ED ([facility], [facility], [facility] ONLY)  GC/CHLAMYDIA PROBE AMP ([place]) NOT AT [facility]    EKG  EKG Interpretation  Date/Time:  Wednesday September 13 2017 15:45:38 EST Ventricular Rate:  71 PR Interval:    QRS Duration: 84 QT Interval:  426 QTC Calculation: 463 R Axis:   86 Text Interpretation:  Sinus rhythm Consider left ventricular hypertrophy Left ventricular hypertrophy Confirmed by Raeford Razor [phone]) on 09/13/2017 4:11:35 PM  Radiology Ct Abdomen Pelvis W Contrast  Result Date: 09/13/2017 CLINICAL DATA:  Abdominal pain EXAM: CT ABDOMEN AND PELVIS WITH CONTRAST TECHNIQUE: Multidetector CT imaging of the abdomen and pelvis was performed using the standard protocol following bolus administration of intravenous contrast. CONTRAST:  ISOVUE-300 IOPAMIDOL (ISOVUE-300) INJECTION 61% COMPARISON:  None. FINDINGS: Lower chest: Lung bases are clear. No effusions. Heart is normal size. Hepatobiliary: Fatty infiltration of the liver. No focal abnormality or biliary ductal dilatation. Gallbladder unremarkable. Pancreas: No focal abnormality or ductal dilatation. Spleen: No focal abnormality.  Normal size. Adrenals/Urinary Tract: No adrenal abnormality. No focal renal abnormality. No stones or hydronephrosis. Urinary bladder is unremarkable. Stomach/Bowel: Normal appendix. Stomach, large and small bowel grossly unremarkable. Vascular/Lymphatic: Scattered aortic and iliac calcifications. No evidence of aneurysm or adenopathy. Reproductive: Uterus and adnexa unremarkable. 2 cm fluid collection noted in  the lower pelvis, possibly representing a vaginal cyst. Other: No free fluid or free air. Musculoskeletal: No acute bony abnormality. IMPRESSION: Diffuse fatty infiltration of the liver. Normal appendix. Scattered aortic atherosclerosis, advanced for patient's age. No acute findings in the abdomen or pelvis. Electronically Signed   By: Charlett Nose M.D.   On: 09/13/2017 15:34    Procedures Procedures (including critical care time)  Medications Ordered in ED Medications  potassium chloride 10 mEq in 100 mL IVPB (not administered)  magnesium sulfate IVPB 1 g 100 mL (not administered)  potassium chloride SA (K-DUR,KLOR-CON) CR tablet 60 mEq (not administered)  potassium chloride 10 mEq in 100 mL IVPB (not administered)  potassium chloride 10 mEq in 100 mL IVPB (not administered)  cefTRIAXone (ROCEPHIN) injection 250 mg (not administered)  azithromycin (ZITHROMAX) tablet 1,000 mg (not administered)  morphine 4 MG/ML injection 4 mg (not administered)  ketorolac (TORADOL) 30 MG/ML injection 30 mg (not administered)  sodium chloride 0.9 % bolus 1,000 mL (1,000 mLs Intravenous New Bag/Given 09/13/17 1448)  ondansetron (ZOFRAN) injection 4 mg (4 mg Intravenous Given 09/13/17 1443)  morphine 4 MG/ML injection 4 mg (4 mg Intravenous Given 09/13/17 1445)  iopamidol (ISOVUE-300) 61 % injection (100 mLs  Contrast Given 09/13/17 1521)     Initial Impression / Assessment and Plan / ED Course  I have reviewed the triage vital signs and the nursing notes.  Pertinent labs & imaging results that were available during my care of the patient were reviewed by me and considered in my medical decision making (see chart for details).  Clinical Course as of Sep 13 1710  Wed Sep 13, 2017  1328 Leukocytes, UA: (!) MODERATE [CG]  1328 Hgb urine dipstick: (!) SMALL [CG]  1328 Potassium: (!!) 2.5 [CG]    Clinical Course User Index [CG] Liberty Handy, PA-C   47 year old female presents with nausea, vomiting,  diarrhea, RLQ/suprapubic abdominal pain 5 days. Also having dysuria and "stabbing" vaginal pain. H/o unprotected sex 3 months ago. No h/o STDs in the past.   On exam, patient is nontoxic appearing. No signs of peritonitis.  She has abdominal tenderness mostly at RLQ and significant pain with pelvic exam. Limited pelvic exam due to poor pt tolerance and pain, only able to advance speculum 4-5 cm did not visualize cervix or palpate adnexa. .  Lab work remarkable for hypokalemia K 2.5. No associated EKG changes. Will replete with IV and PO, and discharge with PO.  Given pain on pelvic, doubt she will tolerate pelvis US. Given RLQ tenderness, chronicity of symptoms, n/v/d think CT AP reasonable. Considering UTI, pyelo, appendicitis, STD, PID. Less likely  torsion or kidney stone.   Final Clinical Impressions(s) / ED Diagnoses   1615: CT AP unremarkable, tiny vaginal cyst noted not acute. Will treat empirically for STDs. Plan to discharge after IV K, Mg, ceftriaxone, zithromax. Abx for PID/UTI and K PO. Discussed return precautions with pt and is agreeable with ED tx and plan.   Final diagnoses:  Right lower quadrant abdominal pain  Acute cystitis with hematuria  Hypokalemia    ED Discharge Orders        Ordered    potassium chloride SA (K-DUR,KLOR-CON) 20 MEQ tablet  Daily     09/13/17 1710    doxycycline (VIBRAMYCIN) 100 MG capsule  2 times daily     09/13/17 1710    traMADol (ULTRAM) 50 MG tablet  Every 6 hours PRN     09/13/17 1710    cephALEXin (KEFLEX) 500 MG capsule  2 times daily     09/13/17 1710       Liberty HandyGibbons, Yu Peggs J, [place] JerseyPA-C 09/13/17 1712    Raeford RazorKohut, Stephen, MD 09/14/17 [phone]

## 2017-09-13 NOTE — ED Notes (Signed)
Many meds to be given iv and po the pt is nauseated and cannot take the po meds now

## 2017-09-13 NOTE — ED Triage Notes (Addendum)
To ED for eval of RLQ pain and vomiting for past 5 days. Pt states this pain and vomiting has gotten worse. Denies fevers. States she can't hold anything down. Pain with urination also. Complains of 'a stabbing situation in my vagina'.

## 2017-09-13 NOTE — ED Notes (Signed)
PT states understanding of care given, follow up care, and medication prescribed. PT ambulated from ED to car with a steady gait. 

## 2017-09-13 NOTE — ED Notes (Signed)
zofran given 4 mg iv  Computer not working

## 2017-09-13 NOTE — Discharge Instructions (Signed)
Your potassium was low. You need to take oral potassium for the next 7 days.   You CT scan did not show any explanation for your pain. Your appendix is normal.  Given your pain on exam, we will treat you for pelvic inflammatory disease with an antibiotic. Your urine looked infected, so this will be treated with a different antibiotic.   Take tylenol or ibuprofen for pain. For more severe pain, take tramadol.   Follow up with your primary care doctor in 1 week for re-evaluation and repeat urine test to ensure urine infection is clear.   Return for fevers, worsening abdominal pain.

## 2017-09-13 NOTE — ED Notes (Signed)
Pt walking around room in severe pain, holding lower abd-- states hurts worse after urinating,

## 2017-09-14 LAB — GC/CHLAMYDIA PROBE AMP (~~LOC~~) NOT AT ARMC
Chlamydia: NEGATIVE
NEISSERIA GONORRHEA: NEGATIVE

## 2017-11-24 ENCOUNTER — Emergency Department (HOSPITAL_COMMUNITY)
Admission: EM | Admit: 2017-11-24 | Discharge: 2017-11-24 | Disposition: A | Discharge status: Home / Self Care | Payer: Medicaid Other | Attending: Emergency Medicine | Admitting: Emergency Medicine

## 2017-11-24 ENCOUNTER — Encounter (HOSPITAL_COMMUNITY): Payer: Self-pay | Admitting: *Deleted

## 2017-11-24 ENCOUNTER — Other Ambulatory Visit: Payer: Self-pay

## 2017-11-24 ENCOUNTER — Emergency Department (HOSPITAL_COMMUNITY): Payer: Medicaid Other

## 2017-11-24 DIAGNOSIS — F17200 Nicotine dependence, unspecified, uncomplicated: Secondary | ICD-10-CM | POA: Insufficient documentation

## 2017-11-24 DIAGNOSIS — J029 Acute pharyngitis, unspecified: Secondary | ICD-10-CM | POA: Insufficient documentation

## 2017-11-24 DIAGNOSIS — J45909 Unspecified asthma, uncomplicated: Secondary | ICD-10-CM | POA: Insufficient documentation

## 2017-11-24 DIAGNOSIS — I10 Essential (primary) hypertension: Secondary | ICD-10-CM | POA: Insufficient documentation

## 2017-11-24 LAB — I-STAT BETA HCG BLOOD, ED (MC, WL, AP ONLY)

## 2017-11-24 LAB — I-STAT CHEM 8, ED
BUN: 5 mg/dL — ABNORMAL LOW (ref 6–20)
CALCIUM ION: 1.03 mmol/L — AB (ref 1.15–1.40)
CREATININE: 0.6 mg/dL (ref 0.44–1.00)
Chloride: 99 mmol/L — ABNORMAL LOW (ref 101–111)
GLUCOSE: 131 mg/dL — AB (ref 65–99)
HCT: 48 % — ABNORMAL HIGH (ref 36.0–46.0)
HEMOGLOBIN: 16.3 g/dL — AB (ref 12.0–15.0)
Potassium: 4.3 mmol/L (ref 3.5–5.1)
Sodium: 136 mmol/L (ref 135–145)
TCO2: 30 mmol/L (ref 22–32)

## 2017-11-24 MED ORDER — AMOXICILLIN-POT CLAVULANATE 875-125 MG PO TABS
1.0000 | ORAL_TABLET | Freq: Once | ORAL | Status: AC
  Administered 2017-11-24: 1 via ORAL
  Filled 2017-11-24: qty 1

## 2017-11-24 MED ORDER — DEXAMETHASONE SODIUM PHOSPHATE 10 MG/ML IJ SOLN
10.0000 mg | Freq: Once | INTRAMUSCULAR | Status: AC
  Administered 2017-11-24: 10 mg via INTRAVENOUS
  Filled 2017-11-24: qty 1

## 2017-11-24 MED ORDER — KETOROLAC TROMETHAMINE 30 MG/ML IJ SOLN
30.0000 mg | Freq: Once | INTRAMUSCULAR | Status: AC
  Administered 2017-11-24: 30 mg via INTRAVENOUS
  Filled 2017-11-24: qty 1

## 2017-11-24 MED ORDER — IOPAMIDOL (ISOVUE-300) INJECTION 61%
INTRAVENOUS | Status: AC
  Administered 2017-11-24: 100 mL
  Filled 2017-11-24: qty 100

## 2017-11-24 MED ORDER — AMOXICILLIN-POT CLAVULANATE 875-125 MG PO TABS: 1.0000 | ORAL_TABLET | Freq: Two times a day (BID) | ORAL | 0 refills | Status: DC

## 2017-11-24 MED ORDER — SODIUM CHLORIDE 0.9 % IV BOLUS (SEPSIS)
1000.0000 mL | Freq: Once | INTRAVENOUS | Status: AC
  Administered 2017-11-24: 1000 mL via INTRAVENOUS

## 2017-11-24 MED ORDER — DEXAMETHASONE 6 MG PO TABS: 10.0000 mg | ORAL_TABLET | Freq: Two times a day (BID) | ORAL | 0 refills | Status: AC

## 2017-11-24 MED ORDER — IBUPROFEN 800 MG PO TABS: 800.0000 mg | ORAL_TABLET | Freq: Three times a day (TID) | ORAL | 0 refills | Status: DC

## 2017-11-24 NOTE — ED Notes (Signed)
Pt discharged from ED; instructions provided and scripts given; Pt encouraged to return to ED if symptoms worsen and to f/u with PCP; Pt verbalized understanding of all instructions 

## 2017-11-24 NOTE — ED Provider Notes (Signed)
Emergency Department Provider Note   I have reviewed the triage vital signs and the nursing notes.   HISTORY  Chief Complaint Influenza   HPI Robin Fischer is a 47 y.o. female who presents with 9 days of sore throat, body aches, chills and fevers.  Last 3 days the left side of her neck, mouth and ear have progressively worsened.  She has pain with swallowing and pain with coughing.  Does feel some postnasal drip but also feels that her throat is swollen.  No dental problems.  No drainage.  No trismus. No other associated or modifying symptoms.    Past Medical History:  Diagnosis Date  . Asthma   . Bronchitis   . High cholesterol   . Hypertension     There are no active problems to display for this patient.   History reviewed. No pertinent surgical history.  Current Outpatient Rx  . Order #: [id number] Class: Print  . Order #: [id number] Class: Print  . Order #: [id number] Class: Print  . Order #: [id number] Class: Print  . Order #: [id number] Class: Print  . Order #: [id number] Class: Print    Allergies Patient has no known allergies.  History reviewed. No pertinent family history.  Social History Social History   Tobacco Use  . Smoking status: Current Every Day Smoker  . Smokeless tobacco: Never Used  Substance Use Topics  . Alcohol use: Yes    Comment: beer  . Drug use: No    Review of Systems  All other systems negative except as documented in the HPI. All pertinent positives and negatives as reviewed in the HPI. ____________________________________________   PHYSICAL EXAM:  VITAL SIGNS: ED Triage Vitals  Enc Vitals Group     BP 11/24/17 0824 (!) 138/94     Pulse Rate 11/24/17 0824 100     Resp 11/24/17 0824 16     Temp 11/24/17 0824 98.7 F (37.1 C)     Temp Source 11/24/17 0824 Oral     SpO2 11/24/17 0824 98 %     Weight 11/24/17 0824 160 lb (72.6 kg)     Height 11/24/17 0824 5\' 7"  (1.702 m)    Constitutional: Alert and oriented. Well  appearing and in no acute distress. Eyes: Conjunctivae are normal. PERRL. EOMI. Head: Atraumatic. Nose: No congestion/rhinnorhea. Mouth/Throat: Mucous membranes are moist.  Oropharynx with posterior erythema and left greater than right tonsillar enlargement. Neck: No stridor.  No meningeal signs.   Cardiovascular: Normal rate, regular rhythm. Good peripheral circulation. Grossly normal heart sounds.   Respiratory: Normal respiratory effort.  No retractions. Lungs CTAB. Gastrointestinal: Soft and nontender. No distention.  Musculoskeletal: No lower extremity tenderness nor edema. No gross deformities of extremities. Neurologic:  Normal speech and language. No gross focal neurologic deficits are appreciated.  Skin:  Skin is warm, dry and intact. No rash noted.  ____________________________________________   LABS (all labs ordered are listed, but only abnormal results are displayed)  Labs Reviewed  I-STAT CHEM 8, ED - Abnormal; Notable for the following components:      Result Value   Chloride 99 (*)    BUN 5 (*)    Glucose, Bld 131 (*)    Calcium, Ion 1.03 (*)    Hemoglobin 16.3 (*)    HCT 48.0 (*)    All other components within normal limits  POC URINE PREG, ED  I-STAT BETA HCG BLOOD, ED ([facility], [facility], [facility] ONLY)   ____________________________________________   RADIOLOGY  Ct Soft Tissue Neck W Contrast  Result Date: 11/24/2017 CLINICAL DATA:  Sore throat over the last 6 days. Headache. Body aches. Chills and cough. EXAM: CT NECK WITH CONTRAST TECHNIQUE: Multidetector CT imaging of the neck was performed using the standard protocol following the bolus administration of intravenous contrast. CONTRAST:  100mL ISOVUE-300 IOPAMIDOL (ISOVUE-300) INJECTION 61% COMPARISON:  None. FINDINGS: Pharynx and larynx: Phlegmonous tonsillitis on the left. No sign of discrete abscess. No deep space extension of inflammatory change. No other mucosal or submucosal finding. Salivary glands: Parotid and  submandibular glands are normal. Thyroid: Normal Lymph nodes: No enlarged or low-density nodes on either side of the neck. Vascular: Normal Limited intracranial: Normal Visualized orbits: Normal Mastoids and visualized paranasal sinuses: Clear Skeleton: Normal Upper chest: Emphysematous changes. No focal or active process. Aortic atherosclerosis. Other: None IMPRESSION: Phlegmonous tonsillitis on the left without evidence of discrete abscess at this time. No evidence of deep space extension of the inflammatory change. Aortic Atherosclerosis (ICD10-I70.0) and Emphysema (ICD10-J43.9). Electronically Signed   By: Paulina FusiMark  Shogry M.D.   On: 11/24/2017 15:27   ____________________________________________   INITIAL IMPRESSION / ASSESSMENT AND PLAN / ED COURSE  Concern for possible PTA. Will CT for same. Abx/decadron/fluids in mean time.   No PTA. Just tonsillitis. Improved po intake. Improved swelling. Improved pain. Stable for dc with abx/steroids.   Pertinent labs & imaging results that were available during my care of the patient were reviewed by me and considered in my medical decision making (see chart for details).  ____________________________________________  FINAL CLINICAL IMPRESSION(S) / ED DIAGNOSES  Final diagnoses:  Sore throat     MEDICATIONS GIVEN DURING THIS VISIT:  Medications  dexamethasone (DECADRON) injection 10 mg (10 mg Intravenous Given 11/24/17 1254)  sodium chloride 0.9 % bolus 1,000 mL (0 mLs Intravenous Stopped 11/24/17 1400)  amoxicillin-clavulanate (AUGMENTIN) 875-125 MG per tablet 1 tablet (1 tablet Oral Given 11/24/17 1254)  ketorolac (TORADOL) 30 MG/ML injection 30 mg (30 mg Intravenous Given 11/24/17 1254)  iopamidol (ISOVUE-300) 61 % injection (100 mLs  Contrast Given 11/24/17 1509)     NEW OUTPATIENT MEDICATIONS STARTED DURING THIS VISIT:  New Prescriptions   AMOXICILLIN-CLAVULANATE (AUGMENTIN) 875-125 MG TABLET    Take 1 tablet by mouth 2 (two) times daily.  One po bid x 7 days   DEXAMETHASONE (DECADRON) 6 MG TABLET    Take 1.5 tablets (9 mg total) by mouth 2 (two) times daily for 3 days.   IBUPROFEN (ADVIL,MOTRIN) 800 MG TABLET    Take 1 tablet (800 mg total) by mouth 3 (three) times daily.    Note:  This note was prepared with assistance of Dragon voice recognition software. Occasional wrong-word or sound-a-like substitutions may have occurred due to the inherent limitations of voice recognition software.   Marily MemosMesner, Yesli Vanderhoff, MD 11/24/17 [phone]

## 2017-11-24 NOTE — ED Notes (Signed)
Bk from CT 

## 2017-11-24 NOTE — ED Triage Notes (Signed)
Pt reports not feeling well since Saturday with headache, sore throat, bodyaches, chills and cough. Mask on pt at triage.

## 2018-01-25 ENCOUNTER — Other Ambulatory Visit (INDEPENDENT_AMBULATORY_CARE_PROVIDER_SITE_OTHER): Payer: Self-pay | Admitting: Physician Assistant

## 2018-01-25 NOTE — Telephone Encounter (Signed)
FWD to PCP. Tempestt S Roberts, CMA  

## 2018-01-26 NOTE — Telephone Encounter (Signed)
Patient has scheduled appt with PCP on 6/20 at 9:50. Maryjean Morn, CMA

## 2018-01-27 ENCOUNTER — Other Ambulatory Visit (INDEPENDENT_AMBULATORY_CARE_PROVIDER_SITE_OTHER): Payer: Self-pay | Admitting: Physician Assistant

## 2018-02-22 ENCOUNTER — Encounter (INDEPENDENT_AMBULATORY_CARE_PROVIDER_SITE_OTHER): Payer: Self-pay | Admitting: Physician Assistant

## 2018-02-22 ENCOUNTER — Ambulatory Visit (INDEPENDENT_AMBULATORY_CARE_PROVIDER_SITE_OTHER): Payer: Medicaid Other | Admitting: Physician Assistant

## 2018-02-22 ENCOUNTER — Other Ambulatory Visit: Payer: Self-pay

## 2018-02-22 VITALS — BP 134/90 | HR 93 | Temp 98.4°F | Ht 67.0 in | Wt 190.4 lb

## 2018-02-22 DIAGNOSIS — H9201 Otalgia, right ear: Secondary | ICD-10-CM | POA: Diagnosis not present

## 2018-02-22 DIAGNOSIS — I10 Essential (primary) hypertension: Secondary | ICD-10-CM

## 2018-02-22 DIAGNOSIS — F1020 Alcohol dependence, uncomplicated: Secondary | ICD-10-CM | POA: Diagnosis not present

## 2018-02-22 DIAGNOSIS — E7841 Elevated Lipoprotein(a): Secondary | ICD-10-CM | POA: Diagnosis not present

## 2018-02-22 NOTE — Progress Notes (Signed)
Subjective:  Patient ID: Robin Fischer, female    DOB: [birth year]  Age: 47 y.o. MRN: [id number]  CC: HTN, HLD, and medication refill  HPI Robin Lawsvelyn Conran is a 47 y.o. female with a PMH of HTN and asthma presents on f/u of HTN. Last BP 150/99 mmHg 13 months ago. Prescribed HCTZ 25 mg and aspirin 81 Mg. Subsequently found to have LDL 204 mg/dL and trigs 528180 mg/dL. Prescribed atorvastatin 40 mg. Did not take medications as directed. Patient ran out of medications approximately one month ago.     Main complaint today is of waxing and waning pain in the right ear since two weeks ago. Says she also has f/c/n/v attributed to trying to ween herself off of alcohol. Drinking about three 16 oz cocktails a day now which is decreased from previous. Last drink this morning. Says she will begin alcohol rehabilitation tomorrow but does not know the name of the program. Program on M-W-F and she is picked up from her home.    Outpatient Medications Prior to Visit  Medication Sig Dispense Refill  . atorvastatin (LIPITOR) 40 MG tablet Take 40 mg by mouth daily.  0  . hydrochlorothiazide (HYDRODIURIL) 25 MG tablet Take 25 mg by mouth every morning.  0  . ibuprofen (ADVIL,MOTRIN) 800 MG tablet Take 1 tablet (800 mg total) by mouth 3 (three) times daily. (Patient not taking: Reported on 02/22/2018) 21 tablet 0  . potassium chloride SA (K-DUR,KLOR-CON) 20 MEQ tablet Take 2 tablets (40 mEq total) by mouth daily for 7 doses. 14 tablet 0  . amoxicillin-clavulanate (AUGMENTIN) 875-125 MG tablet Take 1 tablet by mouth 2 (two) times daily. One po bid x 7 days 14 tablet 0  . doxycycline (VIBRAMYCIN) 100 MG capsule Take 1 capsule (100 mg total) by mouth 2 (two) times daily. 20 capsule 0  . traMADol (ULTRAM) 50 MG tablet Take 1 tablet (50 mg total) by mouth every 6 (six) hours as needed. 15 tablet 0   No facility-administered medications prior to visit.      ROS Review of Systems  Constitutional: Positive for chills,  fever and malaise/fatigue.  HENT: Positive for ear pain.   Eyes: Negative for blurred vision.  Respiratory: Negative for shortness of breath.   Cardiovascular: Negative for chest pain and palpitations.  Gastrointestinal: Negative for abdominal pain and nausea.  Genitourinary: Negative for dysuria and hematuria.  Musculoskeletal: Negative for joint pain and myalgias.  Skin: Negative for rash.  Neurological: Negative for tingling and headaches.  Psychiatric/Behavioral: Negative for depression. The patient is not nervous/anxious.     Objective:  BP 134/90 (BP Location: Left Arm, Patient Position: Sitting, Cuff Size: Normal)   Pulse 93   Temp 98.4 F (36.9 C) (Oral)   Ht 5\' 7"  (1.702 m)   Wt 190 lb 6.4 oz (86.4 kg)   SpO2 100%   BMI 29.82 kg/m   BP/Weight 02/22/2018 11/24/2017 09/13/2017  Systolic BP 134 144 154  Diastolic BP 90 76 74  Wt. (Lbs) 190.4 160 -  BMI 29.82 25.06 -      Physical Exam  Constitutional: She is oriented to person, place, and time.  Well developed, well nourished, NAD, polite  HENT:  Head: Normocephalic and atraumatic.  Mouth/Throat: Oropharynx is clear and moist.  TM normal bilaterally  Eyes: No scleral icterus.  Neck: Normal range of motion. Neck supple. No thyromegaly present.  Cardiovascular: Normal rate, regular rhythm, normal heart sounds and intact distal pulses. Exam reveals no gallop and no  friction rub.  No murmur heard. No LE edema bilaterally  Pulmonary/Chest: Effort normal and breath sounds normal.  Musculoskeletal: She exhibits no edema.  Lymphadenopathy:    She has no cervical adenopathy.  Neurological: She is alert and oriented to person, place, and time.  Normal gait  Skin: Skin is warm and dry. No rash noted. No erythema. No pallor.  Psychiatric: She has a normal mood and affect. Thought content normal.  Pt seems mildly inebriated   Vitals reviewed.    Assessment & Plan:   1. Hypertension, unspecified type - CBC with  Differential - Comprehensive metabolic panel  2. Elevated lipoprotein(a) - Lipid panel  3. Alcohol use disorder, moderate, dependence (HCC) - Will consider naltrexone use but will need to perform UDS to look for opioid use. - Drug Screen, Urine  4. Otalgia of right ear - Normal exam. Likely attributed to TMJ.  Follow-up: Return in about 1 month (around 03/22/2018) for HTN and alcohol abuse.   Loletta Specter PA

## 2018-02-22 NOTE — Patient Instructions (Signed)
Community Resources  Advocacy/Legal Legal Aid [place]:  1-866-219-5262  /  336-272-0148  Family Justice Center:  336-641-7233  [facility] [place] 24-hr Crisis line:  336-273-7273  Women's Resource Center, [place]:  336-275-6090  Court Watch (custody):  336-275-2346  [place] [facility]:   336-279-9299    Baby & Breastfeeding Car Seat Inspection @ Various [place] Fire Depts.- call 336-373-2177  [place] [facility]  336-832-6860  [facility] 336-878-6712  WIC: 336-641-3663 ([place]);  336-641-7571 ([place])  La Leche League:  1-877-452-5321   Childcare [place] [facility]: 336-369-5097 ([place]) / 336-887-8224 ([place])  - Child Care Resources/ Referrals/ Scholarships  - Head Start/ Early Head Start (call or apply online)  [place] DHHS: [place] Pre-K :  1-800-859-0829 / 336-274-5437   Employment / Job Search [facility] [place]: 336-275-6090 / [address]  [place] [employer] (JobLink): 336-373-5922 ([place]) / 336-882-4141 ([place])  [facility] Resource/ Career Center: 336-275-9801 / 336-282-7307  [facility] Job & Career Center: 336-373-3764  DHHS Work First: 336-641-3447 ([place]) / 336-641-3447 ([place])  [facility] [place]:  336-676-5871   Financial Assistance [place] [facility]:  336-553-2657  [employer]: 336-235-0368  [employer] (furniture):  336-370-4002  [place] [facility]: 336-373-4264  Low Income Energy Assistance  336-641-3000   Food Assistance DHHS- SNAP/ Food Stamps: 336-641-4588  WIC: [place]- 336-641-3663 ;  [place] 336-641-7571  Little Green Book- Free Meals  Little Blue Book- Free Food Pantries  During the summer, text "FOOD" to 877877   General Health / Clinics (Adults) Orange Card (for Adults) through [facility]: (336) 895-4900  [facility]:   336-832-8035  [facility]:   336-832-4444  Health Department:  336-641-3245  [facility]:  336-415-3877 / 336-641-2100  [facility] [place]:   336-373-0678  [facility]:   336-334-4822 x 50251   Housing [place] Housing Coalition:   336-691-9521  [place] Housing Authority:  336-275-8501  Affordable Housing Managemnt:  336-273-0568   Immigrant/ Refugee Center for [place]ians (UNCG):  336-256-1065  [facility] [employer] [facility]:  336-379-0037  [employer]:  336-937-4701  [employer] Services:  336-617-0381  African Services Coalition:  336-574-2677   LGBTQ YouthSAFE  www.youthsafe[place].org  PFLAG  336-541-6754 / info@[place].org  The Trevor Project:  1-866-488-7386   Mental Health/ Substance Use [facility]  336-387-6161  [facility]:  336-832-9700 or 1-800-711-2635  [facility] of Care:  336-271-5888  [facility]:  336-294-1349  [facility]:  336-542-2884  [facility] (walk-ins)  336-676-6840 / [address]  [place]:  800-449-1287  Alcoholics Anonymous:  336-854-4278  Narcotics Anonymous:  800-365-1036  Quit Smoking Hotline:  800-QUIT-NOW (800-784-8669)   Parenting Children's Home Society:  800-632-1400  [place]: Education Center & Support Groups:  336-832-6682  YWCA: 336-273-3461  [employer]CG: Bringing Out the Best:  336-334-3120               Thriving at Three (Hispanic families): 336-256-1066  Healthy Start ([facility]):  336-387-6161 x2288  Parents as Teachers:  336-691-0024  [place] [facility]- Learning Together (Immigrants): 336-369-5001   Poison Control 800-222-1222  Sports & Recreation [employer] Open Doors Application: ymcanwnc.org/join/open-doors-financial-assistance/  City of [place] Recreation Centers: http://www.[place]-Freedom.gov/index.aspx?page=3615   Special Needs Family Support Network:  336-832-6507  Autism Society of :   336-333-0197 x1402 or x1412 /  800-785-1035  TEACCH :  336-334-5773     ARC of [place]:  336-373-1076  Children's Developmental Service Agency (CDSA):  336-334-5601  CC4C (Care Coordination for Children):  336-641-7641   Transportation Medicaid Transportation: 336-641-4848 to apply   Transit Authority: 336-335-6499 (reduced-fare bus ID to Medicaid/ Medicare/ Orange Card)  SCAT Paratransit services: Eligible riders only, call 336-333-6589 for application   Tutoring/Mentoring Black Child Development Institute: 336-230-2138  Big Brothers/ Big Sisters: 336-378-9100 ([place])  336-882-4167 ([place])  ACES through child's school: 336-370-2321  [employer] Achievers: contact your local Y  SHIELD Mentor Program: 336-337-2771   

## 2018-02-23 LAB — CBC WITH DIFFERENTIAL/PLATELET
BASOS ABS: 0.1 10*3/uL (ref 0.0–0.2)
Basos: 1 %
EOS (ABSOLUTE): 0.1 10*3/uL (ref 0.0–0.4)
Eos: 1 %
Hematocrit: 48.6 % — ABNORMAL HIGH (ref 34.0–46.6)
Hemoglobin: 17 g/dL — ABNORMAL HIGH (ref 11.1–15.9)
Immature Grans (Abs): 0 10*3/uL (ref 0.0–0.1)
Immature Granulocytes: 0 %
LYMPHS ABS: 3.5 10*3/uL — AB (ref 0.7–3.1)
Lymphs: 45 %
MCH: 29.7 pg (ref 26.6–33.0)
MCHC: 35 g/dL (ref 31.5–35.7)
MCV: 85 fL (ref 79–97)
MONOCYTES: 4 %
MONOS ABS: 0.3 10*3/uL (ref 0.1–0.9)
Neutrophils Absolute: 3.8 10*3/uL (ref 1.4–7.0)
Neutrophils: 49 %
PLATELETS: 278 10*3/uL (ref 150–450)
RBC: 5.73 x10E6/uL — AB (ref 3.77–5.28)
RDW: 16.1 % — ABNORMAL HIGH (ref 12.3–15.4)
WBC: 7.8 10*3/uL (ref 3.4–10.8)

## 2018-02-23 LAB — LIPID PANEL
CHOLESTEROL TOTAL: 337 mg/dL — AB (ref 100–199)
Chol/HDL Ratio: 5.2 ratio — ABNORMAL HIGH (ref 0.0–4.4)
HDL: 65 mg/dL (ref >39)
LDL CALC: 207 mg/dL — AB (ref 0–99)
Triglycerides: 325 mg/dL — ABNORMAL HIGH (ref 0–149)
VLDL Cholesterol Cal: 65 mg/dL — ABNORMAL HIGH (ref 5–40)

## 2018-02-23 LAB — DRUG SCREEN, URINE
AMPHETAMINES, URINE: NEGATIVE ng/mL
Barbiturate screen, urine: NEGATIVE ng/mL
Benzodiazepine Quant, Ur: NEGATIVE ng/mL
COCAINE (METAB.): NEGATIVE ng/mL
Cannabinoid Quant, Ur: NEGATIVE ng/mL
OPIATE QUANT UR: NEGATIVE ng/mL
PCP QUANT UR: NEGATIVE ng/mL

## 2018-02-23 LAB — COMPREHENSIVE METABOLIC PANEL
ALK PHOS: 156 IU/L — AB (ref 39–117)
ALT: 45 IU/L — ABNORMAL HIGH (ref 0–32)
AST: 85 IU/L — ABNORMAL HIGH (ref 0–40)
Albumin/Globulin Ratio: 1.5 (ref 1.2–2.2)
Albumin: 4.6 g/dL (ref 3.5–5.5)
BUN/Creatinine Ratio: 4 — ABNORMAL LOW (ref 9–23)
BUN: 3 mg/dL — ABNORMAL LOW (ref 6–24)
Bilirubin Total: 0.9 mg/dL (ref 0.0–1.2)
CHLORIDE: 93 mmol/L — AB (ref 96–106)
CO2: 29 mmol/L (ref 20–29)
CREATININE: 0.79 mg/dL (ref 0.57–1.00)
Calcium: 9.9 mg/dL (ref 8.7–10.2)
GFR calc Af Amer: 102 mL/min/{1.73_m2} (ref >59)
GFR calc non Af Amer: 89 mL/min/{1.73_m2} (ref >59)
GLUCOSE: 89 mg/dL (ref 65–99)
Globulin, Total: 3 g/dL (ref 1.5–4.5)
Potassium: 3.5 mmol/L (ref 3.5–5.2)
SODIUM: 141 mmol/L (ref 134–144)
Total Protein: 7.6 g/dL (ref 6.0–8.5)

## 2018-03-01 ENCOUNTER — Telehealth (INDEPENDENT_AMBULATORY_CARE_PROVIDER_SITE_OTHER): Payer: Self-pay

## 2018-03-01 NOTE — Telephone Encounter (Signed)
-----   Message from Loletta Specteroger David Gomez, PA-C sent at 02/26/2018  1:20 PM EDT ----- UDS negative. Will need to return for hepatitis testing since liver enzymes are elevated. She needs to stop drinking alcohol.

## 2018-03-01 NOTE — Telephone Encounter (Signed)
Patient is aware that UDS is negative. Needs to return for hepatitis testing since her liver enzymes are elevated. Patient stated she will have to find a ride to come in for testing.  Advised to stop drinking alcohol. Robin Fischer, CMA

## 2018-05-06 ENCOUNTER — Emergency Department (HOSPITAL_COMMUNITY): Payer: Medicaid Other

## 2018-05-06 ENCOUNTER — Emergency Department (HOSPITAL_COMMUNITY)
Admission: EM | Admit: 2018-05-06 | Discharge: 2018-05-06 | Disposition: A | Discharge status: Home / Self Care | Payer: Medicaid Other | Attending: Emergency Medicine | Admitting: Emergency Medicine

## 2018-05-06 ENCOUNTER — Other Ambulatory Visit: Payer: Self-pay

## 2018-05-06 ENCOUNTER — Encounter (HOSPITAL_COMMUNITY): Payer: Self-pay | Admitting: Emergency Medicine

## 2018-05-06 DIAGNOSIS — F17200 Nicotine dependence, unspecified, uncomplicated: Secondary | ICD-10-CM | POA: Diagnosis not present

## 2018-05-06 DIAGNOSIS — R112 Nausea with vomiting, unspecified: Secondary | ICD-10-CM | POA: Diagnosis present

## 2018-05-06 DIAGNOSIS — R1010 Upper abdominal pain, unspecified: Secondary | ICD-10-CM | POA: Insufficient documentation

## 2018-05-06 DIAGNOSIS — R945 Abnormal results of liver function studies: Secondary | ICD-10-CM | POA: Insufficient documentation

## 2018-05-06 DIAGNOSIS — I10 Essential (primary) hypertension: Secondary | ICD-10-CM | POA: Insufficient documentation

## 2018-05-06 DIAGNOSIS — R7989 Other specified abnormal findings of blood chemistry: Secondary | ICD-10-CM

## 2018-05-06 DIAGNOSIS — E7800 Pure hypercholesterolemia, unspecified: Secondary | ICD-10-CM | POA: Insufficient documentation

## 2018-05-06 LAB — COMPREHENSIVE METABOLIC PANEL
ALBUMIN: 3.7 g/dL (ref 3.5–5.0)
ALT: 67 U/L — ABNORMAL HIGH (ref 0–44)
AST: 117 U/L — ABNORMAL HIGH (ref 15–41)
Alkaline Phosphatase: 158 U/L — ABNORMAL HIGH (ref 38–126)
Anion gap: 13 (ref 5–15)
BUN: 5 mg/dL — AB (ref 6–20)
CHLORIDE: 104 mmol/L (ref 98–111)
CO2: 26 mmol/L (ref 22–32)
Calcium: 9.2 mg/dL (ref 8.9–10.3)
Creatinine, Ser: 0.77 mg/dL (ref 0.44–1.00)
GFR calc Af Amer: >60 mL/min (ref >60)
Glucose, Bld: 116 mg/dL — ABNORMAL HIGH (ref 70–99)
POTASSIUM: 3.9 mmol/L (ref 3.5–5.1)
SODIUM: 143 mmol/L (ref 135–145)
Total Bilirubin: 1.8 mg/dL — ABNORMAL HIGH (ref 0.3–1.2)
Total Protein: 7 g/dL (ref 6.5–8.1)

## 2018-05-06 LAB — URINALYSIS, ROUTINE W REFLEX MICROSCOPIC
Bilirubin Urine: NEGATIVE
GLUCOSE, UA: NEGATIVE mg/dL
Hgb urine dipstick: NEGATIVE
Ketones, ur: NEGATIVE mg/dL
LEUKOCYTES UA: NEGATIVE
Nitrite: NEGATIVE
PH: 6 (ref 5.0–8.0)
Protein, ur: NEGATIVE mg/dL
Specific Gravity, Urine: 1.013 (ref 1.005–1.030)

## 2018-05-06 LAB — CBC
HEMATOCRIT: 43.9 % (ref 36.0–46.0)
Hemoglobin: 15.9 g/dL — ABNORMAL HIGH (ref 12.0–15.0)
MCH: 29.7 pg (ref 26.0–34.0)
MCHC: 36.2 g/dL — ABNORMAL HIGH (ref 30.0–36.0)
MCV: 81.9 fL (ref 78.0–100.0)
Platelets: 273 10*3/uL (ref 150–400)
RBC: 5.36 MIL/uL — AB (ref 3.87–5.11)
RDW: 13.8 % (ref 11.5–15.5)
WBC: 7.7 10*3/uL (ref 4.0–10.5)

## 2018-05-06 LAB — LIPASE, BLOOD: LIPASE: 71 U/L — AB (ref 11–51)

## 2018-05-06 MED ORDER — ONDANSETRON 4 MG PO TBDP: 4.0000 mg | ORAL_TABLET | Freq: Three times a day (TID) | ORAL | 0 refills | Status: DC | PRN

## 2018-05-06 MED ORDER — ONDANSETRON HCL 4 MG/2ML IJ SOLN: 4.0000 mg | Freq: Once | INTRAMUSCULAR | Status: DC | PRN

## 2018-05-06 MED ORDER — HYDROCHLOROTHIAZIDE 12.5 MG PO CAPS: 25.0000 mg | ORAL_CAPSULE | Freq: Once | ORAL | Status: DC

## 2018-05-06 MED ORDER — SODIUM CHLORIDE 0.9 % IV BOLUS
1000.0000 mL | Freq: Once | INTRAVENOUS | Status: AC
  Administered 2018-05-06: 1000 mL via INTRAVENOUS

## 2018-05-06 MED ORDER — FAMOTIDINE IN NACL 20-0.9 MG/50ML-% IV SOLN
20.0000 mg | Freq: Once | INTRAVENOUS | Status: AC
  Administered 2018-05-06: 20 mg via INTRAVENOUS
  Filled 2018-05-06: qty 50

## 2018-05-06 MED ORDER — ONDANSETRON HCL 4 MG/2ML IJ SOLN
4.0000 mg | Freq: Once | INTRAMUSCULAR | Status: AC
  Administered 2018-05-06: 4 mg via INTRAVENOUS
  Filled 2018-05-06: qty 2

## 2018-05-06 MED ORDER — GI COCKTAIL ~~LOC~~
30.0000 mL | Freq: Once | ORAL | Status: AC
  Administered 2018-05-06: 30 mL via ORAL
  Filled 2018-05-06: qty 30

## 2018-05-06 MED ORDER — RANITIDINE HCL 150 MG PO TABS: 150.0000 mg | ORAL_TABLET | Freq: Two times a day (BID) | ORAL | 0 refills | Status: DC

## 2018-05-06 NOTE — ED Provider Notes (Signed)
[facility]-EMERGENCY DEPT Provider Note   CSN: [id number] Arrival date & time: 05/06/18  0850     History   Chief Complaint Chief Complaint  Patient presents with  . Abdominal Pain  . Nausea  . Diarrhea    HPI Robin Fischer is a 47 y.o. female with history of asthma, bronchitis, hypertension, and hyperlipidemia presents for evaluation of gradual onset, progressively worsening nausea and vomiting for 3 months.  She states that she has not been able to "keep anything down "for the past 2 to 3 months aside from V8 juice.  She notes on average 3 episodes of nonbloody nonbilious emesis and 3-4 episodes of nonbloody loose stools which are occasionally "mucousy".  She states that for the past 3 to 4 days or so she has been experiencing upper abdominal discomfort which she describes as a pressure-like sensation which radiates bilaterally.  She also endorses abdominal bloating.  She notes mild dysuria over the past few days and some urinary frequency and urgency.  Denies hematuria, fevers, or chills.  She took an over-the-counter pain medicine yesterday without significant relief of her symptoms.  She endorses drinking 1 beer daily and states her symptoms began after she began to drink fruity cocktails at least 2 daily.  She states that she had to stop drinking these beverages because of the symptoms she was experiencing.  Chart review shows that she went to see her PCP on 02/22/2018 who noted that she was beginning in alcohol rehabilitation program.  No recent travel, no recent treatment with antibiotics.  The history is provided by the patient.    Past Medical History:  Diagnosis Date  . Asthma   . Bronchitis   . High cholesterol   . Hypertension     There are no active problems to display for this patient.   History reviewed. No pertinent surgical history.   OB History   None      Home Medications    Prior to Admission medications   Medication Sig Start  Date End Date Taking? Authorizing Provider  Acetaminophen (RA FEVER REDUCER/PAIN RELIEVER PO) Take 1 tablet by mouth daily as needed (for pain).   Yes [provider]  ondansetron (ZOFRAN ODT) 4 MG disintegrating tablet Take 1 tablet (4 mg total) by mouth every 8 (eight) hours as needed for nausea or vomiting. 05/06/18   Chanelle Hodsdon A, PA-C  potassium chloride SA (K-DUR,KLOR-CON) 20 MEQ tablet Take 2 tablets (40 mEq total) by mouth daily for 7 doses. Patient not taking: Reported on 05/06/2018 09/13/17 05/06/18  Liberty Handy, PA-C  ranitidine (ZANTAC) 150 MG tablet Take 1 tablet (150 mg total) by mouth 2 (two) times daily. 05/06/18   Jeanie Sewer, PA-C    Family History History reviewed. No pertinent family history.  Social History Social History   Tobacco Use  . Smoking status: Current Every Day Smoker  . Smokeless tobacco: Never Used  Substance Use Topics  . Alcohol use: Yes    Comment: beer  . Drug use: No     Allergies   Patient has no known allergies.   Review of Systems Review of Systems  Constitutional: Negative for chills and fever.  Respiratory: Negative for shortness of breath.   Cardiovascular: Negative for chest pain.  Gastrointestinal: Positive for abdominal pain, diarrhea, nausea and vomiting.  Genitourinary: Positive for dysuria and frequency. Negative for hematuria.  All other systems reviewed and are negative.    Physical Exam Updated Vital Signs BP  139/79   Pulse 62   Temp 98.7 F (37.1 C) (Oral)   Resp 17   Ht 5\' 7"  (1.702 m)   SpO2 97%   BMI 29.82 kg/m   Physical Exam  Constitutional: She appears well-developed and well-nourished. No distress.  HENT:  Head: Normocephalic and atraumatic.  Eyes: Conjunctivae are normal. Right eye exhibits no discharge. Left eye exhibits no discharge.  Neck: No JVD present. No tracheal deviation present.  Cardiovascular: Normal rate.  Pulmonary/Chest: Effort normal.  Abdominal: Soft. Bowel sounds are  normal. She exhibits no distension. There is tenderness in the epigastric area and left upper quadrant. There is no rigidity, no rebound, no guarding and no CVA tenderness.  Musculoskeletal: She exhibits no edema.  No midline spine TTP, no paraspinal muscle tenderness, no deformity, crepitus, or step-off noted   Neurological: She is alert.  Skin: Skin is warm and dry. No erythema.  Psychiatric: She has a normal mood and affect. Her behavior is normal.  Nursing note and vitals reviewed.    ED Treatments / Results  Labs (all labs ordered are listed, but only abnormal results are displayed) Labs Reviewed  LIPASE, BLOOD - Abnormal; Notable for the following components:      Result Value   Lipase 71 (*)    All other components within normal limits  COMPREHENSIVE METABOLIC PANEL - Abnormal; Notable for the following components:   Glucose, Bld 116 (*)    BUN 5 (*)    AST 117 (*)    ALT 67 (*)    Alkaline Phosphatase 158 (*)    Total Bilirubin 1.8 (*)    All other components within normal limits  CBC - Abnormal; Notable for the following components:   RBC 5.36 (*)    Hemoglobin 15.9 (*)    MCHC 36.2 (*)    All other components within normal limits  URINALYSIS, ROUTINE W REFLEX MICROSCOPIC    EKG None  Radiology US Abdomen Limited Ruq  Result Date: 05/06/2018 CLINICAL DATA:  Elevated LFTs EXAM: ULTRASOUND ABDOMEN LIMITED RIGHT UPPER QUADRANT COMPARISON:  09/13/2017 FINDINGS: Gallbladder: No gallstones or wall thickening visualized. No sonographic Murphy sign noted by sonographer. Common bile duct: Diameter: 4 mm Liver: Increased liver echogenicity compatible with hepatic steatosis. This limits evaluation for a focal hepatic abnormality. No biliary dilatation within the liver. No surrounding ascites. Portal vein is patent on color Doppler imaging with normal direction of blood flow towards the liver. IMPRESSION: No acute finding by ultrasound. Hepatic steatosis Negative for gallstones  or biliary dilatation Electronically Signed   By: Judie Petit.  Shick M.D.   On: 05/06/2018 11:50    Procedures Procedures (including critical care time)  Medications Ordered in ED Medications  gi cocktail (Maalox,Lidocaine,Donnatal) (30 mLs Oral Given 05/06/18 0949)  sodium chloride 0.9 % bolus 1,000 mL (0 mLs Intravenous Stopped 05/06/18 1315)  famotidine (PEPCID) IVPB 20 mg premix (0 mg Intravenous Stopped 05/06/18 1315)  ondansetron (ZOFRAN) injection 4 mg (4 mg Intravenous Given 05/06/18 0949)     Initial Impression / Assessment and Plan / ED Course  I have reviewed the triage vital signs and the nursing notes.  Pertinent labs & imaging results that were available during my care of the patient were reviewed by me and considered in my medical decision making (see chart for details).     Patient with nausea and vomiting as well as upper abdominal pain for several months.  Pain worsens after eating.  She is afebrile, hypertensive in the ED although  has not been able to take her blood pressure medications.  This did improve on reevaluation.  No peritoneal signs on examination of the abdomen.  Epigastric and left upper quadrant abdominal pain noted on examination.  Lab work reviewed by me shows lipase mildly elevated though not 3 times the upper limit of normal, less likely pancreatitis.  She does have mild elevations in her ALT, AST, and alkaline phosphatase as well as total bilirubin.  Some of these were present on lab work in June but do appear worsened.  Will obtain right upper quadrant ultrasound for further evaluation.  Creatinine within normal limits.  No anemia, no leukocytosis.  UA is not suggestive of UTI or nephrolithiasis.  Right upper quadrant ultrasound shows hepatic steatosis, no acute findings suggestive of cholecystitis, choledocholithiasis, or hepatitis.  She was given IV fluids, GI cocktail, and Pepcid as well as Zofran in the ED.  On reevaluation she is resting comfortably no apparent  distress.  Serial abdominal examinations remain benign.  She is tolerating p.o. food and fluids in the ED without difficulty.  Doubt obstruction, perforation, appendicitis, colitis, or other acute surgical abdominal pathology.  Suspect GERD versus PUD versus gastritis.  She does have a history of alcohol abuse.  Advised patient of lifestyle modifications including alcohol cessation and avoiding spicy or fried foods.  Will discharge with course of Zantac and Zofran as needed for nausea.  Recommend follow-up with PCP or gastroenterology for reevaluation.  Discussed strict ED return precautions. Pt verbalized understanding of and agreement with plan and is safe for discharge home at this time.   Final Clinical Impressions(s) / ED Diagnoses   Final diagnoses:  Elevated LFTs  Upper abdominal pain  Non-intractable vomiting with nausea, unspecified vomiting type    ED Discharge Orders         Ordered    ranitidine (ZANTAC) 150 MG tablet  2 times daily     05/06/18 1403    ondansetron (ZOFRAN ODT) 4 MG disintegrating tablet  Every 8 hours PRN     05/06/18 1403           Jeanie Sewer, PA-C 05/07/18 0741    Lorre Nick, MD 05/08/18 1244

## 2018-05-06 NOTE — ED Notes (Signed)
ED Provider at bedside. 

## 2018-05-06 NOTE — Discharge Instructions (Addendum)
1. Medications:  Take Zofran as needed for nausea.  Wait around 20 minutes before eating or drinking after taking this medication.  Start taking Zantac twice daily as prescribed. 2. Treatment: rest, drink plenty of fluids, advance diet slowly.  Avoid fried foods, fatty foods, spicy foods, or acidic foods.  Avoid alcohol or smoking.  I have attached information of foods that will hopefully not upset your stomach.  You may also find it helpful to elevate the head of the bed. 3. Follow Up: Please followup with your primary doctor in 3-7 days for discussion of your diagnoses and further evaluation after today's visit;  Please return to the ER for persistent vomiting, high fevers or worsening symptoms

## 2018-05-06 NOTE — ED Triage Notes (Signed)
Pt c/o abdominal pain in RUQ and LUQ, pt states she has nausea and diarrhea after eating x several months.

## 2018-06-07 ENCOUNTER — Ambulatory Visit (INDEPENDENT_AMBULATORY_CARE_PROVIDER_SITE_OTHER): Payer: Medicaid Other

## 2018-06-07 ENCOUNTER — Other Ambulatory Visit (INDEPENDENT_AMBULATORY_CARE_PROVIDER_SITE_OTHER): Payer: Self-pay | Admitting: Physician Assistant

## 2018-06-07 DIAGNOSIS — Z23 Encounter for immunization: Secondary | ICD-10-CM | POA: Diagnosis not present

## 2018-06-07 MED ORDER — OMEPRAZOLE 40 MG PO CPDR: 40.0000 mg | DELAYED_RELEASE_CAPSULE | Freq: Every day | ORAL | 3 refills | Status: DC

## 2018-06-07 MED ORDER — HYDROCHLOROTHIAZIDE 25 MG PO TABS: 25.0000 mg | ORAL_TABLET | Freq: Every day | ORAL | 1 refills | Status: DC

## 2018-06-07 NOTE — Progress Notes (Signed)
Nurse visit for flu vaccination  

## 2018-07-05 ENCOUNTER — Ambulatory Visit (INDEPENDENT_AMBULATORY_CARE_PROVIDER_SITE_OTHER): Payer: Medicaid Other | Admitting: Physician Assistant

## 2018-08-19 ENCOUNTER — Emergency Department (HOSPITAL_COMMUNITY): Payer: Medicaid Other

## 2018-08-19 ENCOUNTER — Encounter (HOSPITAL_COMMUNITY): Payer: Self-pay | Admitting: Emergency Medicine

## 2018-08-19 ENCOUNTER — Other Ambulatory Visit: Payer: Self-pay

## 2018-08-19 ENCOUNTER — Inpatient Hospital Stay (HOSPITAL_COMMUNITY)
Admission: EM | Admit: 2018-08-19 | Discharge: 2018-08-26 | DRG: 439 | Disposition: A | Discharge status: Home / Self Care | Payer: Medicaid Other | Attending: Internal Medicine | Admitting: Internal Medicine

## 2018-08-19 DIAGNOSIS — E876 Hypokalemia: Secondary | ICD-10-CM | POA: Diagnosis present

## 2018-08-19 DIAGNOSIS — R7989 Other specified abnormal findings of blood chemistry: Secondary | ICD-10-CM | POA: Diagnosis present

## 2018-08-19 DIAGNOSIS — E785 Hyperlipidemia, unspecified: Secondary | ICD-10-CM | POA: Diagnosis present

## 2018-08-19 DIAGNOSIS — F1010 Alcohol abuse, uncomplicated: Secondary | ICD-10-CM | POA: Diagnosis present

## 2018-08-19 DIAGNOSIS — K859 Acute pancreatitis without necrosis or infection, unspecified: Secondary | ICD-10-CM | POA: Diagnosis present

## 2018-08-19 DIAGNOSIS — E861 Hypovolemia: Secondary | ICD-10-CM | POA: Diagnosis present

## 2018-08-19 DIAGNOSIS — D72829 Elevated white blood cell count, unspecified: Secondary | ICD-10-CM | POA: Diagnosis present

## 2018-08-19 DIAGNOSIS — F17200 Nicotine dependence, unspecified, uncomplicated: Secondary | ICD-10-CM | POA: Diagnosis present

## 2018-08-19 DIAGNOSIS — K219 Gastro-esophageal reflux disease without esophagitis: Secondary | ICD-10-CM | POA: Diagnosis present

## 2018-08-19 DIAGNOSIS — R0600 Dyspnea, unspecified: Secondary | ICD-10-CM

## 2018-08-19 DIAGNOSIS — I10 Essential (primary) hypertension: Secondary | ICD-10-CM | POA: Diagnosis present

## 2018-08-19 DIAGNOSIS — F10188 Alcohol abuse with other alcohol-induced disorder: Secondary | ICD-10-CM | POA: Diagnosis present

## 2018-08-19 DIAGNOSIS — R945 Abnormal results of liver function studies: Secondary | ICD-10-CM | POA: Diagnosis present

## 2018-08-19 DIAGNOSIS — K852 Alcohol induced acute pancreatitis without necrosis or infection: Secondary | ICD-10-CM

## 2018-08-19 DIAGNOSIS — J45909 Unspecified asthma, uncomplicated: Secondary | ICD-10-CM | POA: Diagnosis present

## 2018-08-19 DIAGNOSIS — Z720 Tobacco use: Secondary | ICD-10-CM | POA: Diagnosis present

## 2018-08-19 HISTORY — DX: Tobacco use: Z72.0

## 2018-08-19 LAB — CBC WITH DIFFERENTIAL/PLATELET
Abs Immature Granulocytes: 0.12 10*3/uL — ABNORMAL HIGH (ref 0.00–0.07)
Basophils Absolute: 0.1 10*3/uL (ref 0.0–0.1)
Basophils Relative: 0 %
Eosinophils Absolute: 0.1 10*3/uL (ref 0.0–0.5)
Eosinophils Relative: 1 %
HCT: 44.3 % (ref 36.0–46.0)
Hemoglobin: 15.3 g/dL — ABNORMAL HIGH (ref 12.0–15.0)
Immature Granulocytes: 1 %
Lymphocytes Relative: 52 %
Lymphs Abs: 6.9 10*3/uL — ABNORMAL HIGH (ref 0.7–4.0)
MCH: 28 pg (ref 26.0–34.0)
MCHC: 34.5 g/dL (ref 30.0–36.0)
MCV: 81.1 fL (ref 80.0–100.0)
Monocytes Absolute: 0.6 10*3/uL (ref 0.1–1.0)
Monocytes Relative: 5 %
Neutro Abs: 5.5 10*3/uL (ref 1.7–7.7)
Neutrophils Relative %: 41 %
Platelets: 283 10*3/uL (ref 150–400)
RBC: 5.46 MIL/uL — ABNORMAL HIGH (ref 3.87–5.11)
RDW: 14 % (ref 11.5–15.5)
WBC: 13.3 10*3/uL — ABNORMAL HIGH (ref 4.0–10.5)
nRBC: 0.1 % (ref 0.0–0.2)

## 2018-08-19 LAB — COMPREHENSIVE METABOLIC PANEL
ALT: 69 U/L — ABNORMAL HIGH (ref 0–44)
AST: 175 U/L — ABNORMAL HIGH (ref 15–41)
Albumin: 4 g/dL (ref 3.5–5.0)
Alkaline Phosphatase: 136 U/L — ABNORMAL HIGH (ref 38–126)
Anion gap: 17 — ABNORMAL HIGH (ref 5–15)
BUN: 5 mg/dL — ABNORMAL LOW (ref 6–20)
CO2: 27 mmol/L (ref 22–32)
Calcium: 9.3 mg/dL (ref 8.9–10.3)
Chloride: 95 mmol/L — ABNORMAL LOW (ref 98–111)
Creatinine, Ser: 0.86 mg/dL (ref 0.44–1.00)
GFR calc Af Amer: >60 mL/min (ref >60)
GFR calc non Af Amer: >60 mL/min (ref >60)
Glucose, Bld: 166 mg/dL — ABNORMAL HIGH (ref 70–99)
Potassium: 2.1 mmol/L — CL (ref 3.5–5.1)
Sodium: 139 mmol/L (ref 135–145)
Total Bilirubin: 1.2 mg/dL (ref 0.3–1.2)
Total Protein: 7.2 g/dL (ref 6.5–8.1)

## 2018-08-19 LAB — I-STAT TROPONIN, ED: Troponin i, poc: 0 ng/mL (ref 0.00–0.08)

## 2018-08-19 LAB — I-STAT BETA HCG BLOOD, ED (MC, WL, AP ONLY): I-stat hCG, quantitative: <5 m[IU]/mL (ref <5)

## 2018-08-19 LAB — MAGNESIUM: Magnesium: 1.9 mg/dL (ref 1.7–2.4)

## 2018-08-19 LAB — LIPASE, BLOOD: Lipase: 3782 U/L — ABNORMAL HIGH (ref 11–51)

## 2018-08-19 MED ORDER — IOPAMIDOL (ISOVUE-300) INJECTION 61%
100.0000 mL | Freq: Once | INTRAVENOUS | Status: AC | PRN
  Administered 2018-08-19: 100 mL via INTRAVENOUS

## 2018-08-19 MED ORDER — IOPAMIDOL (ISOVUE-300) INJECTION 61%
INTRAVENOUS | Status: AC
  Filled 2018-08-19: qty 100

## 2018-08-19 MED ORDER — MAGNESIUM SULFATE 50 % IJ SOLN: 1.0000 g | Freq: Once | INTRAMUSCULAR | Status: DC

## 2018-08-19 MED ORDER — LIDOCAINE VISCOUS HCL 2 % MT SOLN
15.0000 mL | Freq: Once | OROMUCOSAL | Status: AC
  Administered 2018-08-19: 15 mL via ORAL
  Filled 2018-08-19: qty 15

## 2018-08-19 MED ORDER — HYOSCYAMINE SULFATE 0.125 MG SL SUBL
0.2500 mg | SUBLINGUAL_TABLET | Freq: Once | SUBLINGUAL | Status: AC
  Administered 2018-08-19: 0.25 mg via SUBLINGUAL
  Filled 2018-08-19: qty 2

## 2018-08-19 MED ORDER — ONDANSETRON HCL 4 MG/2ML IJ SOLN
4.0000 mg | Freq: Once | INTRAMUSCULAR | Status: AC
  Administered 2018-08-19: 4 mg via INTRAVENOUS
  Filled 2018-08-19: qty 2

## 2018-08-19 MED ORDER — SODIUM CHLORIDE (PF) 0.9 % IJ SOLN
INTRAMUSCULAR | Status: AC
  Filled 2018-08-19: qty 50

## 2018-08-19 MED ORDER — ALUM & MAG HYDROXIDE-SIMETH 200-200-20 MG/5ML PO SUSP
30.0000 mL | Freq: Once | ORAL | Status: AC
  Administered 2018-08-19: 30 mL via ORAL
  Filled 2018-08-19: qty 30

## 2018-08-19 MED ORDER — MAGNESIUM SULFATE IN D5W 1-5 GM/100ML-% IV SOLN
1.0000 g | Freq: Once | INTRAVENOUS | Status: AC
  Administered 2018-08-19: 1 g via INTRAVENOUS
  Filled 2018-08-19: qty 100

## 2018-08-19 MED ORDER — MORPHINE SULFATE (PF) 4 MG/ML IV SOLN
4.0000 mg | Freq: Once | INTRAVENOUS | Status: AC
  Administered 2018-08-19: 4 mg via INTRAVENOUS
  Filled 2018-08-19: qty 1

## 2018-08-19 MED ORDER — POTASSIUM CHLORIDE 10 MEQ/100ML IV SOLN
10.0000 meq | INTRAVENOUS | Status: DC
  Administered 2018-08-19 – 2018-08-20 (×2): 10 meq via INTRAVENOUS
  Filled 2018-08-19 (×2): qty 100

## 2018-08-19 MED ORDER — HYDROMORPHONE HCL 1 MG/ML IJ SOLN
1.0000 mg | Freq: Once | INTRAMUSCULAR | Status: AC
  Administered 2018-08-20: 1 mg via INTRAVENOUS
  Filled 2018-08-19: qty 1

## 2018-08-19 MED ORDER — POTASSIUM CHLORIDE CRYS ER 20 MEQ PO TBCR
40.0000 meq | EXTENDED_RELEASE_TABLET | Freq: Once | ORAL | Status: AC
  Administered 2018-08-19: 40 meq via ORAL
  Filled 2018-08-19: qty 2

## 2018-08-19 NOTE — ED Notes (Signed)
Patient transported to CT 

## 2018-08-19 NOTE — ED Notes (Signed)
Date and time results received: 08/19/18 2247 (use smartphrase ".now" to insert current time)  Test: K+ Critical Value: 2.1  Name of Provider Notified: Michela PitcherMina Fawze  Orders Received? Or Actions Taken?: reported K+ 2.1 to [facility].

## 2018-08-19 NOTE — ED Provider Notes (Signed)
[facility]-EMERGENCY DEPT Provider Note   CSN: [id number] Arrival date & time: 08/19/18  2143     History   Chief Complaint Chief Complaint  Patient presents with  . Abdominal Pain    HPI Robin Fischer is a 47 y.o. female with history of asthma, bronchitis, hyperlipidemia, hypertension presents for evaluation of acute onset, constant upper abdominal pain beginning approximately 1 hour prior to arrival.  Patient states that she was sitting watching television when she began to have severe sharp epigastric abdominal pain radiating to the right upper quadrant.  She has some nausea but denies vomiting, diarrhea, constipation, chest pain, fevers, chills, or urinary symptoms.  She does note some shortness of breath when the pain intensifies.  She has not tried anything for her symptoms.  Reports that she has had similar symptoms in the past but never this severe.  Worsens with movement.  The history is provided by the patient.    Past Medical History:  Diagnosis Date  . Asthma   . Bronchitis   . High cholesterol   . Hypertension   . Tobacco abuse     Patient Active Problem List   Diagnosis Date Noted  . Hypokalemia 08/20/2018  . Tobacco abuse 08/20/2018  . Leukocytosis 08/20/2018  . Alcohol use 08/20/2018  . 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 Abnormal LFTs 08/20/2018  . Asthma   . Hypertension     History reviewed. No pertinent surgical history.   OB History   No obstetric history on file.      Home Medications    Prior to Admission medications   Medication Sig Start Date End Date Taking? Authorizing Provider  hydrochlorothiazide (HYDRODIURIL) 25 MG tablet Take 1 tablet (25 mg total) by mouth daily. 06/07/18  Yes Loletta Specter, PA-C  ranitidine (ZANTAC) 150 MG tablet Take 1 tablet (150 mg total) by mouth 2 (two) times daily. Patient taking differently: Take 150 mg by mouth 2 (two) times daily as needed for heartburn.  05/06/18  Yes Vonetta Foulk,  Tatijana Bierly A, PA-C  omeprazole (PRILOSEC) 40 MG capsule Take 1 capsule (40 mg total) by mouth daily. Patient not taking: Reported on 08/20/2018 06/07/18   Loletta Specter, PA-C  ondansetron [facility] ODT) 4 MG disintegrating tablet Take 1 tablet (4 mg total) by mouth every 8 (eight) hours as needed for nausea or vomiting. Patient not taking: Reported on 08/20/2018 05/06/18   Jeanie Sewer, PA-C    Family History No family history on file.  Social History Social History   Tobacco Use  . Smoking status: Current Every Day Smoker  . Smokeless tobacco: Never Used  Substance Use Topics  . Alcohol use: Yes    Comment: beer  . Drug use: No     Allergies   Patient has no known allergies.   Review of Systems Review of Systems  Constitutional: Negative for chills and fever.  Respiratory: Negative for shortness of breath.   Cardiovascular: Negative for chest pain.  Gastrointestinal: Positive for abdominal pain and nausea. Negative for constipation, diarrhea and vomiting.  All other systems reviewed and are negative.    Physical Exam Updated Vital Signs BP 138/68   Pulse 79   Temp (!) 97.5 F (36.4 C) (Oral)   Resp 19   SpO2 97%   Physical Exam Vitals signs and nursing note reviewed.  Constitutional:      General: She is not in acute distress.    Appearance: She is well-developed. She is ill-appearing and diaphoretic.  Comments: Appears uncomfortable, laying in the left lateral decubitus position, diaphoretic  HENT:     Head: Normocephalic and atraumatic.  Eyes:     General:        Right eye: No discharge.        Left eye: No discharge.     Conjunctiva/sclera: Conjunctivae normal.  Neck:     Vascular: No JVD.     Trachea: No tracheal deviation.  Cardiovascular:     Rate and Rhythm: Normal rate.  Pulmonary:     Effort: Pulmonary effort is normal.     Breath sounds: Normal breath sounds.  Abdominal:     General: Bowel sounds are decreased. There is distension.      Tenderness: There is generalized abdominal tenderness and tenderness in the right upper quadrant and epigastric area. There is no right CVA tenderness, left CVA tenderness, guarding or rebound. Negative signs include Murphy's sign and Rovsing's sign.     Comments: Frequent belching.  Generalized tenderness to palpation, maximally tender in the right upper quadrant and epigastric regions  Musculoskeletal:     Comments: No midline spine TTP, no paraspinal muscle tenderness, no deformity, crepitus, or step-off noted   Skin:    General: Skin is warm.     Findings: No erythema.  Neurological:     Mental Status: She is alert.  Psychiatric:        Behavior: Behavior normal.      ED Treatments / Results  Labs (all labs ordered are listed, but only abnormal results are displayed) Labs Reviewed  CBC WITH DIFFERENTIAL/PLATELET - Abnormal; Notable for the following components:      Result Value   WBC 13.3 (*)    RBC 5.46 (*)    Hemoglobin 15.3 (*)    Lymphs Abs 6.9 (*)    Abs Immature Granulocytes 0.12 (*)    All other components within normal limits  COMPREHENSIVE METABOLIC PANEL - Abnormal; Notable for the following components:   Potassium 2.1 (*)    Chloride 95 (*)    Glucose, Bld 166 (*)    BUN 5 (*)    AST 175 (*)    ALT 69 (*)    Alkaline Phosphatase 136 (*)    Anion gap 17 (*)    All other components within normal limits  LIPASE, BLOOD - Abnormal; Notable for the following components:   Lipase 3,782 (*)    All other components within normal limits  MAGNESIUM  URINALYSIS, ROUTINE W REFLEX MICROSCOPIC  HEPATITIS PANEL, ACUTE  TRIGLYCERIDES  HIV ANTIBODY (ROUTINE TESTING W REFLEX)  COMPREHENSIVE METABOLIC PANEL  I-STAT TROPONIN, ED  I-STAT BETA HCG BLOOD, ED ([facility], [facility], [facility] ONLY)    EKG EKG Interpretation  Date/Time:  Sunday August 19 2018 23:34:09 EST Ventricular Rate:  68 PR Interval:    QRS Duration: 105 QT Interval:  498 QTC Calculation: 530 R Axis:   80 Text  Interpretation:  Sinus rhythm Probable left ventricular hypertrophy Prolonged QT interval Confirmed by Tilden Fossaees, Elizabeth [phone]([id number]) on 08/20/2018 12:29:08 AM   Radiology Ct Abdomen Pelvis W Contrast  Result Date: 08/19/2018 CLINICAL DATA:  Pancreatitis suspected, atypical presentation/labs. Abdominal pain. Nausea and vomiting. EXAM: CT ABDOMEN AND PELVIS WITH CONTRAST TECHNIQUE: Multidetector CT imaging of the abdomen and pelvis was performed using the standard protocol following bolus administration of intravenous contrast. CONTRAST:  100mL ISOVUE-300 IOPAMIDOL (ISOVUE-300) INJECTION 61% COMPARISON:  CT 09/13/2017 FINDINGS: Lower chest: Lung bases are clear. No consolidation. Hepatobiliary: Enlarged liver with right lobe spanning  21.7 cm. Advanced hepatic steatosis. No discrete focal lesion. Mild gallbladder distention without calcified gallstone or pericholecystic inflammation. No biliary dilatation. Pancreas: Moderate diffuse peripancreatic fat stranding and free fluid about the entirety of the pancreas. No ductal dilatation. Homogeneous pancreatic enhancement without evidence of necrosis. No walled-off fluid collection. Splenic vein is patent. Spleen: Small posterior cleft. Normal in size. Splenic vein is patent. Adrenals/Urinary Tract: Normal adrenal glands. No hydronephrosis or perinephric edema. Homogeneous renal enhancement with symmetric excretion on delayed phase imaging. Incidental note of 2 right and 2 left renal arteries. Urinary bladder is near completely empty. Stomach/Bowel: Stomach is decompressed. Minimal fluid in the distal esophagus. No bowel wall thickening, inflammatory change, or obstruction. Mild diverticulosis of the descending colon without acute diverticulitis. Vascular/Lymphatic: Mild aortic atherosclerosis without aneurysm. Portal, splenic vein, and mesenteric veins are patent. No acute vascular findings. No enlarged lymph nodes in the abdomen or pelvis. Small peripancreatic nodes are  likely reactive. Reproductive: Uterus and adnexa are unremarkable. Vaginal cyst is unchanged measuring 2.3 x 2.5 cm. Other: No free air. Peripancreatic fluid which slightly tracks in the mesentery. No walled-off fluid collection. Musculoskeletal: There are no acute or suspicious osseous abnormalities. IMPRESSION: 1. Acute edematous pancreatitis. No pancreatic necrosis or walled-off collection. 2. Hepatomegaly and hepatic steatosis. 3. Minimal diverticulosis without diverticulitis. 4. Stable cystic lesion in the vagina. 5.  Aortic Atherosclerosis (ICD10-I70.0). Electronically Signed   By: Narda Rutherford M.D.   On: 08/19/2018 23:39    Procedures .Critical Care Performed by: Jeanie Sewer, PA-C Authorized by: Jeanie Sewer, PA-C   Critical care provider statement:    Critical care time (minutes):  35   Critical care was necessary to treat or prevent imminent or life-threatening deterioration of the following conditions:  Metabolic crisis   Critical care was time spent personally by me on the following activities:  Discussions with consultants, evaluation of patient's response to treatment, examination of patient, ordering and performing treatments and interventions, ordering and review of laboratory studies, ordering and review of radiographic studies, pulse oximetry, re-evaluation of patient's condition, obtaining history from patient or surrogate and review of old charts   I assumed direction of critical care for this patient from another provider in my specialty: no     (including critical care time)  Medications Ordered in ED Medications  iopamidol (ISOVUE-300) 61 % injection (has no administration in time range)  sodium chloride (PF) 0.9 % injection (has no administration in time range)  morphine 2 MG/ML injection 2 mg (has no administration in time range)  potassium chloride SA (K-DUR,KLOR-CON) CR tablet 40 mEq (has no administration in time range)  potassium chloride 10 mEq in 100 mL IVPB  (has no administration in time range)  nicotine (NICODERM CQ - dosed in mg/24 hours) patch 21 mg (has no administration in time range)  hydrOXYzine (ATARAX/VISTARIL) tablet 10 mg (has no administration in time range)  LORazepam (ATIVAN) tablet 1 mg (has no administration in time range)    Or  LORazepam (ATIVAN) injection 1 mg (has no administration in time range)  thiamine (VITAMIN B-1) tablet 100 mg (has no administration in time range)    Or  thiamine (B-1) injection 100 mg (has no administration in time range)  folic acid (FOLVITE) tablet 1 mg (has no administration in time range)  multivitamin with minerals tablet 1 tablet (has no administration in time range)  LORazepam (ATIVAN) injection 0-4 mg (has no administration in time range)    Followed by  LORazepam (ATIVAN) injection  0-4 mg (has no administration in time range)  hydrOXYzine (VISTARIL) injection 25 mg (has no administration in time range)  hydrALAZINE (APRESOLINE) injection 5 mg (has no administration in time range)  zolpidem (AMBIEN) tablet 5 mg (has no administration in time range)  enoxaparin (LOVENOX) injection 40 mg (has no administration in time range)  senna-docusate (Senokot-S) tablet 1 tablet (has no administration in time range)  sodium chloride 0.9 % bolus 1,000 mL (has no administration in time range)  0.9 %  sodium chloride infusion (has no administration in time range)  morphine 4 MG/ML injection 4 mg (4 mg Intravenous Given 08/19/18 2236)  ondansetron (ZOFRAN) injection 4 mg (4 mg Intravenous Given 08/19/18 2236)  hyoscyamine (LEVSIN SL) SL tablet 0.25 mg (0.25 mg Sublingual Given 08/19/18 2239)  alum & mag hydroxide-simeth (MAALOX/MYLANTA) 200-200-20 MG/5ML suspension 30 mL (30 mLs Oral Given 08/19/18 2306)    And  lidocaine (XYLOCAINE) 2 % viscous mouth solution 15 mL (15 mLs Oral Given 08/19/18 2306)  potassium chloride SA (K-DUR,KLOR-CON) CR tablet 40 mEq (40 mEq Oral Given 08/19/18 2306)  magnesium  sulfate IVPB 1 g 100 mL (1 g Intravenous New Bag/Given 08/19/18 2330)  iopamidol (ISOVUE-300) 61 % injection 100 mL (100 mLs Intravenous Contrast Given 08/19/18 2312)  HYDROmorphone (DILAUDID) injection 1 mg (1 mg Intravenous Given 08/20/18 0004)     Initial Impression / Assessment and Plan / ED Course  I have reviewed the triage vital signs and the nursing notes.  Pertinent labs & imaging results that were available during my care of the patient were reviewed by me and considered in my medical decision making (see chart for details).     Patient with sudden onset upper abdominal pain beginning just prior to arrival.  She is afebrile, somewhat hypertensive in the ED.  She appears extremely uncomfortable, diaphoretic on initial assessment.  Will obtain labs and imaging for further evaluation  Lab work reviewed by me shows leukocytosis, hypokalemia with potassium of 2.1, and elevated LFTs which appears to be similar to lab work performed in July.  She was given p.o. and IV potassium supplementation and magnesium bolus as well.  EKG shows prolonged QT interval. Patient received zofran prior to BMP result showing hypokalemia.  Remainder of labs significant for elevated lipase of 3782.  CT of the abdomen and pelvis shows acute edematous pancreatitis.  No evidence of necrosis or abscess.  On reassessment, patient reports she has had minimal improvement in her pain.  When asked about alcohol intake she reports that she drinks 3-4 alcoholic beverages daily. Spoke with Dr. Clyde Lundborg tried hospitalist service who agrees to assume care of patient and bring her into the hospital for further evaluation and management.  Final Clinical Impressions(s) / ED Diagnoses   Final diagnoses:  Alcohol-induced acute pancreatitis without infection or necrosis  Hypokalemia    ED Discharge Orders    None       Bennye Alm 08/20/18 0034    Linwood Dibbles, MD 08/20/18 [phone]

## 2018-08-19 NOTE — ED Notes (Signed)
Patient returned from CT

## 2018-08-19 NOTE — ED Triage Notes (Signed)
Pt arriving via [facility] for epigastric pain which began approx 45 minutes ago. No distention. No N/V/D. Last BM was today a couple hours ago. Pt reports taking 2 antacid pills prior to EMS arrival.

## 2018-08-19 NOTE — ED Notes (Signed)
Bed: WA08 Expected date:  Expected time:  Means of arrival:  Comments: 47 yo F/ epigastric pain

## 2018-08-20 ENCOUNTER — Observation Stay (HOSPITAL_COMMUNITY): Payer: Medicaid Other

## 2018-08-20 ENCOUNTER — Encounter (HOSPITAL_COMMUNITY): Payer: Self-pay | Admitting: Internal Medicine

## 2018-08-20 DIAGNOSIS — R945 Abnormal results of liver function studies: Secondary | ICD-10-CM | POA: Diagnosis present

## 2018-08-20 DIAGNOSIS — K219 Gastro-esophageal reflux disease without esophagitis: Secondary | ICD-10-CM | POA: Diagnosis present

## 2018-08-20 DIAGNOSIS — F17200 Nicotine dependence, unspecified, uncomplicated: Secondary | ICD-10-CM | POA: Diagnosis present

## 2018-08-20 DIAGNOSIS — I10 Essential (primary) hypertension: Secondary | ICD-10-CM | POA: Diagnosis present

## 2018-08-20 DIAGNOSIS — F1010 Alcohol abuse, uncomplicated: Secondary | ICD-10-CM | POA: Diagnosis not present

## 2018-08-20 DIAGNOSIS — E861 Hypovolemia: Secondary | ICD-10-CM | POA: Diagnosis present

## 2018-08-20 DIAGNOSIS — F10188 Alcohol abuse with other alcohol-induced disorder: Secondary | ICD-10-CM | POA: Diagnosis present

## 2018-08-20 DIAGNOSIS — D72829 Elevated white blood cell count, unspecified: Secondary | ICD-10-CM | POA: Diagnosis present

## 2018-08-20 DIAGNOSIS — E876 Hypokalemia: Secondary | ICD-10-CM | POA: Diagnosis present

## 2018-08-20 DIAGNOSIS — Z7289 Other problems related to lifestyle: Secondary | ICD-10-CM | POA: Diagnosis not present

## 2018-08-20 DIAGNOSIS — R0600 Dyspnea, unspecified: Secondary | ICD-10-CM | POA: Diagnosis not present

## 2018-08-20 DIAGNOSIS — K859 Acute pancreatitis without necrosis or infection, unspecified: Secondary | ICD-10-CM | POA: Diagnosis present

## 2018-08-20 DIAGNOSIS — J45909 Unspecified asthma, uncomplicated: Secondary | ICD-10-CM | POA: Diagnosis present

## 2018-08-20 DIAGNOSIS — R7989 Other specified abnormal findings of blood chemistry: Secondary | ICD-10-CM | POA: Diagnosis present

## 2018-08-20 DIAGNOSIS — E785 Hyperlipidemia, unspecified: Secondary | ICD-10-CM | POA: Diagnosis present

## 2018-08-20 DIAGNOSIS — K852 Alcohol induced acute pancreatitis without necrosis or infection: Principal | ICD-10-CM

## 2018-08-20 DIAGNOSIS — J4520 Mild intermittent asthma, uncomplicated: Secondary | ICD-10-CM

## 2018-08-20 DIAGNOSIS — Z720 Tobacco use: Secondary | ICD-10-CM

## 2018-08-20 LAB — COMPREHENSIVE METABOLIC PANEL
ALT: 58 U/L — AB (ref 0–44)
AST: 104 U/L — AB (ref 15–41)
Albumin: 3.7 g/dL (ref 3.5–5.0)
Alkaline Phosphatase: 119 U/L (ref 38–126)
Anion gap: 14 (ref 5–15)
BUN: 6 mg/dL (ref 6–20)
CO2: 27 mmol/L (ref 22–32)
Calcium: 8.6 mg/dL — ABNORMAL LOW (ref 8.9–10.3)
Chloride: 100 mmol/L (ref 98–111)
Creatinine, Ser: 0.8 mg/dL (ref 0.44–1.00)
GFR calc Af Amer: >60 mL/min (ref >60)
GFR calc non Af Amer: >60 mL/min (ref >60)
Glucose, Bld: 129 mg/dL — ABNORMAL HIGH (ref 70–99)
Potassium: 3 mmol/L — ABNORMAL LOW (ref 3.5–5.1)
Sodium: 141 mmol/L (ref 135–145)
Total Bilirubin: 0.9 mg/dL (ref 0.3–1.2)
Total Protein: 6.4 g/dL — ABNORMAL LOW (ref 6.5–8.1)

## 2018-08-20 LAB — LIPASE, BLOOD: Lipase: 991 U/L — ABNORMAL HIGH (ref 11–51)

## 2018-08-20 LAB — TRIGLYCERIDES: Triglycerides: 343 mg/dL — ABNORMAL HIGH (ref <150)

## 2018-08-20 MED ORDER — LORAZEPAM 2 MG/ML IJ SOLN
1.0000 mg | Freq: Four times a day (QID) | INTRAMUSCULAR | Status: AC | PRN
  Administered 2018-08-20: 1 mg via INTRAVENOUS
  Filled 2018-08-20: qty 1

## 2018-08-20 MED ORDER — ONDANSETRON HCL 4 MG/2ML IJ SOLN
4.0000 mg | Freq: Four times a day (QID) | INTRAMUSCULAR | Status: DC | PRN
  Administered 2018-08-20 – 2018-08-21 (×2): 4 mg via INTRAVENOUS
  Filled 2018-08-20 (×2): qty 2

## 2018-08-20 MED ORDER — HYDROXYZINE HCL 25 MG PO TABS
25.0000 mg | ORAL_TABLET | Freq: Three times a day (TID) | ORAL | Status: DC | PRN
  Administered 2018-08-24 – 2018-08-25 (×2): 25 mg via ORAL
  Filled 2018-08-20 (×4): qty 1

## 2018-08-20 MED ORDER — ALBUTEROL SULFATE (2.5 MG/3ML) 0.083% IN NEBU
2.5000 mg | INHALATION_SOLUTION | RESPIRATORY_TRACT | Status: DC | PRN
  Administered 2018-08-21 – 2018-08-23 (×2): 2.5 mg via RESPIRATORY_TRACT
  Filled 2018-08-20 (×2): qty 3

## 2018-08-20 MED ORDER — SENNOSIDES-DOCUSATE SODIUM 8.6-50 MG PO TABS: 1.0000 | ORAL_TABLET | Freq: Every evening | ORAL | Status: DC | PRN

## 2018-08-20 MED ORDER — MORPHINE SULFATE (PF) 2 MG/ML IV SOLN
2.0000 mg | INTRAVENOUS | Status: DC | PRN
  Administered 2018-08-20 – 2018-08-23 (×21): 2 mg via INTRAVENOUS
  Filled 2018-08-20 (×21): qty 1

## 2018-08-20 MED ORDER — ADULT MULTIVITAMIN W/MINERALS CH
1.0000 | ORAL_TABLET | Freq: Every day | ORAL | Status: DC
  Administered 2018-08-20 – 2018-08-26 (×6): 1 via ORAL
  Filled 2018-08-20 (×6): qty 1

## 2018-08-20 MED ORDER — VITAMIN B-1 100 MG PO TABS
100.0000 mg | ORAL_TABLET | Freq: Every day | ORAL | Status: DC
  Administered 2018-08-20 – 2018-08-26 (×6): 100 mg via ORAL
  Filled 2018-08-20 (×6): qty 1

## 2018-08-20 MED ORDER — HYDROXYZINE HCL 50 MG/ML IM SOLN
25.0000 mg | Freq: Four times a day (QID) | INTRAMUSCULAR | Status: DC | PRN
  Filled 2018-08-20: qty 0.5

## 2018-08-20 MED ORDER — LORAZEPAM 2 MG/ML IJ SOLN: 0.0000 mg | Freq: Two times a day (BID) | INTRAMUSCULAR | Status: AC

## 2018-08-20 MED ORDER — NICOTINE 21 MG/24HR TD PT24
21.0000 mg | MEDICATED_PATCH | Freq: Every day | TRANSDERMAL | Status: DC
  Administered 2018-08-20 – 2018-08-26 (×7): 21 mg via TRANSDERMAL
  Filled 2018-08-20 (×7): qty 1

## 2018-08-20 MED ORDER — FOLIC ACID 1 MG PO TABS
1.0000 mg | ORAL_TABLET | Freq: Every day | ORAL | Status: DC
  Administered 2018-08-20 – 2018-08-26 (×6): 1 mg via ORAL
  Filled 2018-08-20 (×5): qty 1

## 2018-08-20 MED ORDER — LORAZEPAM 2 MG/ML IJ SOLN
0.0000 mg | Freq: Four times a day (QID) | INTRAMUSCULAR | Status: AC
  Administered 2018-08-21: 2 mg via INTRAVENOUS
  Filled 2018-08-20 (×2): qty 1

## 2018-08-20 MED ORDER — LORAZEPAM 1 MG PO TABS
1.0000 mg | ORAL_TABLET | Freq: Four times a day (QID) | ORAL | Status: AC | PRN
  Administered 2018-08-22: 1 mg via ORAL
  Filled 2018-08-20: qty 1

## 2018-08-20 MED ORDER — POTASSIUM CHLORIDE CRYS ER 20 MEQ PO TBCR
40.0000 meq | EXTENDED_RELEASE_TABLET | Freq: Once | ORAL | Status: AC
  Administered 2018-08-20: 40 meq via ORAL
  Filled 2018-08-20: qty 2

## 2018-08-20 MED ORDER — ENOXAPARIN SODIUM 40 MG/0.4ML ~~LOC~~ SOLN
40.0000 mg | Freq: Every day | SUBCUTANEOUS | Status: DC
  Administered 2018-08-20 – 2018-08-25 (×6): 40 mg via SUBCUTANEOUS
  Filled 2018-08-20 (×7): qty 0.4

## 2018-08-20 MED ORDER — HYDROXYZINE HCL 10 MG PO TABS: 10.0000 mg | ORAL_TABLET | Freq: Three times a day (TID) | ORAL | Status: DC | PRN

## 2018-08-20 MED ORDER — HYDRALAZINE HCL 20 MG/ML IJ SOLN
5.0000 mg | INTRAMUSCULAR | Status: DC | PRN
  Administered 2018-08-23: 5 mg via INTRAVENOUS
  Filled 2018-08-20: qty 1

## 2018-08-20 MED ORDER — SODIUM CHLORIDE 0.9 % IV BOLUS
1000.0000 mL | Freq: Once | INTRAVENOUS | Status: AC
  Administered 2018-08-20: 1000 mL via INTRAVENOUS

## 2018-08-20 MED ORDER — AMLODIPINE BESYLATE 5 MG PO TABS
5.0000 mg | ORAL_TABLET | Freq: Every day | ORAL | Status: DC
  Administered 2018-08-20 – 2018-08-26 (×6): 5 mg via ORAL
  Filled 2018-08-20 (×7): qty 1

## 2018-08-20 MED ORDER — ZOLPIDEM TARTRATE 5 MG PO TABS
5.0000 mg | ORAL_TABLET | Freq: Every evening | ORAL | Status: DC | PRN
  Administered 2018-08-20 – 2018-08-25 (×4): 5 mg via ORAL
  Filled 2018-08-20 (×5): qty 1

## 2018-08-20 MED ORDER — SODIUM CHLORIDE 0.9 % IV SOLN
INTRAVENOUS | Status: DC
  Administered 2018-08-20 – 2018-08-23 (×9): via INTRAVENOUS

## 2018-08-20 MED ORDER — THIAMINE HCL 100 MG/ML IJ SOLN
100.0000 mg | Freq: Every day | INTRAMUSCULAR | Status: DC
  Administered 2018-08-23: 100 mg via INTRAVENOUS
  Filled 2018-08-20: qty 2

## 2018-08-20 MED ORDER — POTASSIUM CHLORIDE 10 MEQ/100ML IV SOLN
10.0000 meq | INTRAVENOUS | Status: AC
  Administered 2018-08-20 (×2): 10 meq via INTRAVENOUS
  Filled 2018-08-20 (×2): qty 100

## 2018-08-20 NOTE — ED Notes (Signed)
ED TO INPATIENT HANDOFF REPORT  Name/Age/Gender Robin Fischer 47 y.o. female  Code Status   Home/SNF/Other Home  Chief Complaint abdominal pains  Level of Care/Admitting Diagnosis ED Disposition    None      Medical History Past Medical History:  Diagnosis Date  . Asthma   . Bronchitis   . High cholesterol   . Hypertension     Allergies No Known Allergies  IV Location/Drains/Wounds Patient Lines/Drains/Airways Status   Active Line/Drains/Airways    Name:   Placement date:   Placement time:   Site:   Days:   Peripheral IV 08/19/18 Right Antecubital   08/19/18    -    Antecubital   1          Labs/Imaging Results for orders placed or performed during the hospital encounter of 08/19/18 (from the past 48 hour(s))  CBC with Differential     Status: Abnormal   Collection Time: 08/19/18 10:03 PM  Result Value Ref Range   WBC 13.3 (H) 4.0 - 10.5 K/uL   RBC 5.46 (H) 3.87 - 5.11 MIL/uL   Hemoglobin 15.3 (H) 12.0 - 15.0 g/dL   HCT 40.9 81.1 - 91.4 %   MCV 81.1 80.0 - 100.0 fL   MCH 28.0 26.0 - 34.0 pg   MCHC 34.5 30.0 - 36.0 g/dL   RDW 78.2 95.6 - 21.3 %   Platelets 283 150 - 400 K/uL   nRBC 0.1 0.0 - 0.2 %   Neutrophils Relative % 41 %   Neutro Abs 5.5 1.7 - 7.7 K/uL   Lymphocytes Relative 52 %   Lymphs Abs 6.9 (H) 0.7 - 4.0 K/uL   Monocytes Relative 5 %   Monocytes Absolute 0.6 0.1 - 1.0 K/uL   Eosinophils Relative 1 %   Eosinophils Absolute 0.1 0.0 - 0.5 K/uL   Basophils Relative 0 %   Basophils Absolute 0.1 0.0 - 0.1 K/uL   Immature Granulocytes 1 %   Abs Immature Granulocytes 0.12 (H) 0.00 - 0.07 K/uL   Reactive, Benign Lymphocytes PRESENT    Target Cells PRESENT    Stomatocytes PRESENT     Comment: Performed at [facility], [address] [place], [place] [address]  Comprehensive metabolic panel     Status: Abnormal   Collection Time: 08/19/18 10:03 PM  Result Value Ref Range   Sodium 139 135 - 145 mmol/L   Potassium 2.1  (LL) 3.5 - 5.1 mmol/L    Comment: CRITICAL RESULT CALLED TO, READ BACK BY AND VERIFIED WITH: JESSEE AT 2246 ON 08/19/2018 BY MOSLEY,J    Chloride 95 (L) 98 - 111 mmol/L   CO2 27 22 - 32 mmol/L   Glucose, Bld 166 (H) 70 - 99 mg/dL   BUN 5 (L) 6 - 20 mg/dL   Creatinine, Ser 8.46 0.44 - 1.00 mg/dL   Calcium 9.3 8.9 - 96.2 mg/dL   Total Protein 7.2 6.5 - 8.1 g/dL   Albumin 4.0 3.5 - 5.0 g/dL   AST 952 (H) 15 - 41 U/L   ALT 69 (H) 0 - 44 U/L   Alkaline Phosphatase 136 (H) 38 - 126 U/L   Total Bilirubin 1.2 0.3 - 1.2 mg/dL   GFR calc non Af Amer >60 >60 mL/min   GFR calc Af Amer >60 >60 mL/min   Anion gap 17 (H) 5 - 15    Comment: Performed at [facility], [address] [place], [place] [address]  Lipase, blood  Status: Abnormal   Collection Time: 08/19/18 10:03 PM  Result Value Ref Range   Lipase 3,782 (H) 11 - 51 U/L    Comment: RESULTS CONFIRMED BY MANUAL DILUTION Performed at [facility] [address], [place], [place] [address]   I-Stat beta hCG blood, ED     Status: None   Collection Time: 08/19/18 10:48 PM  Result Value Ref Range   I-stat hCG, quantitative <5.0 <5 mIU/mL   Comment 3            Comment:   GEST. AGE      CONC.  (mIU/mL)   <=1 WEEK        5 - 50     2 WEEKS       50 - 500     3 WEEKS       100 - 10,000     4 WEEKS     1,000 - 30,000        FEMALE AND NON-PREGNANT FEMALE:     LESS THAN 5 mIU/mL   I-Stat Troponin, ED (not at [facility])     Status: None   Collection Time: 08/19/18 10:49 PM  Result Value Ref Range   Troponin i, poc 0.00 0.00 - 0.08 ng/mL   Comment 3            Comment: Due to the release kinetics of cTnI, a negative result within the first hours of the onset of symptoms does not rule out myocardial infarction with certainty. If myocardial infarction is still suspected, repeat the test at appropriate intervals.   Magnesium     Status: None   Collection Time: 08/19/18 10:56 PM  Result Value Ref  Range   Magnesium 1.9 1.7 - 2.4 mg/dL    Comment: Performed at [facility], [address] [place], [place] [address]   Ct Abdomen Pelvis W Contrast  Result Date: 08/19/2018 CLINICAL DATA:  Pancreatitis suspected, atypical presentation/labs. Abdominal pain. Nausea and vomiting. EXAM: CT ABDOMEN AND PELVIS WITH CONTRAST TECHNIQUE: Multidetector CT imaging of the abdomen and pelvis was performed using the standard protocol following bolus administration of intravenous contrast. CONTRAST:  ISOVUE-300 IOPAMIDOL (ISOVUE-300) INJECTION 61% COMPARISON:  CT 09/13/2017 FINDINGS: Lower chest: Lung bases are clear. No consolidation. Hepatobiliary: Enlarged liver with right lobe spanning 21.7 cm. Advanced hepatic steatosis. No discrete focal lesion. Mild gallbladder distention without calcified gallstone or pericholecystic inflammation. No biliary dilatation. Pancreas: Moderate diffuse peripancreatic fat stranding and free fluid about the entirety of the pancreas. No ductal dilatation. Homogeneous pancreatic enhancement without evidence of necrosis. No walled-off fluid collection. Splenic vein is patent. Spleen: Small posterior cleft. Normal in size. Splenic vein is patent. Adrenals/Urinary Tract: Normal adrenal glands. No hydronephrosis or perinephric edema. Homogeneous renal enhancement with symmetric excretion on delayed phase imaging. Incidental note of 2 right and 2 left renal arteries. Urinary bladder is near completely empty. Stomach/Bowel: Stomach is decompressed. Minimal fluid in the distal esophagus. No bowel wall thickening, inflammatory change, or obstruction. Mild diverticulosis of the descending colon without acute diverticulitis. Vascular/Lymphatic: Mild aortic atherosclerosis without aneurysm. Portal, splenic vein, and mesenteric veins are patent. No acute vascular findings. No enlarged lymph nodes in the abdomen or pelvis. Small peripancreatic nodes are likely reactive.  Reproductive: Uterus and adnexa are unremarkable. Vaginal cyst is unchanged measuring 2.3 x 2.5 cm. Other: No free air. Peripancreatic fluid which slightly tracks in the mesentery. No walled-off fluid collection. Musculoskeletal: There are no acute or suspicious osseous  abnormalities. IMPRESSION: 1. Acute edematous pancreatitis. No pancreatic necrosis or walled-off collection. 2. Hepatomegaly and hepatic steatosis. 3. Minimal diverticulosis without diverticulitis. 4. Stable cystic lesion in the vagina. 5.  Aortic Atherosclerosis (ICD10-I70.0). Electronically Signed   By: Narda RutherfordMelanie  Sanford M.D.   On: 08/19/2018 23:39   None  Pending Labs Unresulted Labs (From admission, onward)    Start     Ordered   08/19/18 2159  Urinalysis, Routine w reflex microscopic  (ED Abdominal Pain)  ONCE - STAT,   STAT     08/19/18 2158          Vitals/Pain Today's Vitals   08/19/18 2157 08/19/18 2200 08/19/18 2334 08/19/18 2345  BP: (!) 148/111 (!) 168/90 (!) 159/90   Pulse: 76 69 67   Resp: 18  (!) 8   Temp: (!) 97.5 F (36.4 C)     TempSrc: Oral     SpO2: 100% 100% 99%   PainSc:    8     Isolation Precautions No active isolations  Medications Medications  potassium chloride 10 mEq in 100 mL IVPB (10 mEq Intravenous New Bag/Given 08/19/18 2307)  magnesium sulfate IVPB 1 g 100 mL (1 g Intravenous New Bag/Given 08/19/18 2330)  iopamidol (ISOVUE-300) 61 % injection (has no administration in time range)  sodium chloride (PF) 0.9 % injection (has no administration in time range)  morphine 4 MG/ML injection 4 mg (4 mg Intravenous Given 08/19/18 2236)  ondansetron (ZOFRAN) injection 4 mg (4 mg Intravenous Given 08/19/18 2236)  hyoscyamine (LEVSIN SL) SL tablet 0.25 mg (0.25 mg Sublingual Given 08/19/18 2239)  alum & mag hydroxide-simeth (MAALOX/MYLANTA) 200-200-20 MG/5ML suspension 30 mL (30 mLs Oral Given 08/19/18 2306)    And  lidocaine (XYLOCAINE) 2 % viscous mouth solution 15 mL (15 mLs Oral Given  08/19/18 2306)  potassium chloride SA (K-DUR,KLOR-CON) CR tablet 40 mEq (40 mEq Oral Given 08/19/18 2306)  iopamidol (ISOVUE-300) 61 % injection 100 mL (100 mLs Intravenous Contrast Given 08/19/18 2312)  HYDROmorphone (DILAUDID) injection 1 mg (1 mg Intravenous Given 08/20/18 0004)    Mobility walks

## 2018-08-20 NOTE — Care Management Obs Status (Signed)
MEDICARE OBSERVATION STATUS NOTIFICATION   Patient Details  Name: Robin Fischer MRN: [id number] Date of Birth: [birth year]   Medicare Observation Status Notification Given:  Yes    Golda AcreDavis, Amanee Iacovelli Lynn, RN 08/20/2018, 10:35 AM

## 2018-08-20 NOTE — Progress Notes (Signed)
PROGRESS NOTE  Robin Fischer [id number] DOB: 23-Jul-1971 DOA: 08/19/2018 PCP: Loletta SpecterGomez, Roger David, PA-C  HPI/Recap of past 24 hours: HPI from Dr Harrie ForemanNiu Robin Fischer is a 47 y.o. female with medical history significant of tobacco abuse, alcohol abuse, hypertension, hyperlipidemia, asthma, GERD, who presents with abdominal pain. Patient states that her abdominal pain is located in the epigastric area, constant, sharp, severe, nonradiating.  It is associated with nausea.  She had one nonbilious nonbloody vomiting in the ED.  No diarrhea.  Denies fever or chills.  Patient does not have chest pain, shortness breath, cough, symptoms of UTI or unilateral weakness.  Patient states that she drinks alcohol, cocktail, 3-4 drinks every day. In the ED, pt was found to have elevated lipase 3782, abnormal liver function (ALP 136, AST 175, ALT 69, total bilirubin 1.2), negative pregnancy test, negative troponin, potassium 2.1. CT of abdomen/pelvis showed edematous pancreatitis without necrosis or pseudocyst.  Patient admitted for further management.   Today, patient still reports generalized abdominal pain, worse in the epigastric region and radiating to her back.  Patient still complains of nausea and some nonbilious vomiting.  Patient denies any chest pain, shortness of breath, fever/chills.  Assessment/Plan: Principal Problem:   Alcoholic pancreatitis Active Problems:   Asthma   Hypertension   Hypokalemia   Tobacco abuse   Leukocytosis   Alcohol abuse   Abnormal LFTs   Acute pancreatitis   Alcoholic pancreatitis with elevated LFTs Elevated lipase 3782-->991 Abnormal LFTs, likely due to alcohol abuse Hepatitis panel pending CT abdomen showed pancreatitis without necrosis or pseudocyst Ultrasound right upper quadrant showed hepatic steatosis, no gallstones and normal appearance of gallbladder and biliary tree N.p.o., IV fluids, pain management IV hydroxyzine for nausea as patient has QT  prolongation Patient advised to quit alcohol  Hypokalemia Replace PRN  Leukocytosis Likely reactive Repeat CBC in a.m.  Hypertension Started on amlodipine here in the hospital Continue to hold HCTZ IV hydralazine as needed  Asthma Stable Albuterol nebulizer prn  Tobacco and alcohol abuse Advised to quit both Nicotine patch CIWA protocol        Malnutrition Type:      Malnutrition Characteristics:      Nutrition Interventions:       Estimated body mass index is 29.82 kg/m as calculated from the following:   Height as of 05/06/18: 5\' 7"  (1.702 m).   Weight as of 02/22/18: 86.4 kg.     Code Status: Full  Family Communication: None at bedside  Disposition Plan: Home once stable   Consultants:  None  Procedures:  None  Antimicrobials:  None  DVT prophylaxis: Lovenox   Objective: Vitals:   08/20/18 0100 08/20/18 0120 08/20/18 0555 08/20/18 1356  BP: (!) 143/94 (!) 158/90 (!) 159/88 (!) 144/92  Pulse: 62 65 67 95  Resp: 12 (!) 22 20 20   Temp:  97.8 F (36.6 C) 97.6 F (36.4 C) 99 F (37.2 C)  TempSrc:  Oral Oral Oral  SpO2: 100% 97% 96% 93%    Intake/Output Summary (Last 24 hours) at 08/20/2018 1901 Last data filed at 08/20/2018 1200 Gross per 24 hour  Intake 2149.51 ml  Output 600 ml  Net 1549.51 ml   There were no vitals filed for this visit.  Exam:   General: In mild distress due to pain  Cardiovascular: S1, S2 present  Respiratory: CTAB  Abdomen: Soft, generalized tenderness, nondistended, bowel sounds present  Musculoskeletal: No pedal edema bilaterally  Skin: Normal  Psychiatry: Normal mood  Data Reviewed: CBC: Recent Labs  Lab 08/19/18 2203  WBC 13.3*  NEUTROABS 5.5  HGB 15.3*  HCT 44.3  MCV 81.1  PLT 283   Basic Metabolic Panel: Recent Labs  Lab 08/19/18 2203 08/19/18 2256 08/20/18 0553  NA 139  --  141  K 2.1*  --  3.0*  CL 95*  --  100  CO2 27  --  27  GLUCOSE 166*  --  129*  BUN  5*  --  6  CREATININE 0.86  --  0.80  CALCIUM 9.3  --  8.6*  MG  --  1.9  --    GFR: CrCl cannot be calculated (Unknown ideal weight.). Liver Function Tests: Recent Labs  Lab 08/19/18 2203 08/20/18 0553  AST 175* 104*  ALT 69* 58*  ALKPHOS 136* 119  BILITOT 1.2 0.9  PROT 7.2 6.4*  ALBUMIN 4.0 3.7   Recent Labs  Lab 08/19/18 2203 08/20/18 0553  LIPASE 3,782* 991*   No results for input(s): AMMONIA in the last 168 hours. Coagulation Profile: No results for input(s): INR, PROTIME in the last 168 hours. Cardiac Enzymes: No results for input(s): CKTOTAL, CKMB, CKMBINDEX, TROPONINI in the last 168 hours. BNP (last 3 results) No results for input(s): PROBNP in the last 8760 hours. HbA1C: No results for input(s): HGBA1C in the last 72 hours. CBG: No results for input(s): GLUCAP in the last 168 hours. Lipid Profile: Recent Labs    08/20/18 0021  TRIG 343*   Thyroid Function Tests: No results for input(s): TSH, T4TOTAL, FREET4, T3FREE, THYROIDAB in the last 72 hours. Anemia Panel: No results for input(s): VITAMINB12, FOLATE, FERRITIN, TIBC, IRON, RETICCTPCT in the last 72 hours. Urine analysis:    Component Value Date/Time   COLORURINE YELLOW 05/06/2018 1054   APPEARANCEUR CLEAR 05/06/2018 1054   LABSPEC 1.013 05/06/2018 1054   PHURINE 6.0 05/06/2018 1054   GLUCOSEU NEGATIVE 05/06/2018 1054   HGBUR NEGATIVE 05/06/2018 1054   BILIRUBINUR NEGATIVE 05/06/2018 1054   BILIRUBINUR negative 01/18/2017 1658   KETONESUR NEGATIVE 05/06/2018 1054   PROTEINUR NEGATIVE 05/06/2018 1054   UROBILINOGEN 0.2 01/18/2017 1658   NITRITE NEGATIVE 05/06/2018 1054   LEUKOCYTESUR NEGATIVE 05/06/2018 1054   Sepsis Labs: @LABRCNTIP (procalcitonin:4,lacticidven:4)  )No results found for this or any previous visit (from the past 240 hour(s)).    Studies: Ct Abdomen Pelvis W Contrast  Result Date: 08/19/2018 CLINICAL DATA:  Pancreatitis suspected, atypical presentation/labs. Abdominal  pain. Nausea and vomiting. EXAM: CT ABDOMEN AND PELVIS WITH CONTRAST TECHNIQUE: Multidetector CT imaging of the abdomen and pelvis was performed using the standard protocol following bolus administration of intravenous contrast. CONTRAST:  ISOVUE-300 IOPAMIDOL (ISOVUE-300) INJECTION 61% COMPARISON:  CT 09/13/2017 FINDINGS: Lower chest: Lung bases are clear. No consolidation. Hepatobiliary: Enlarged liver with right lobe spanning 21.7 cm. Advanced hepatic steatosis. No discrete focal lesion. Mild gallbladder distention without calcified gallstone or pericholecystic inflammation. No biliary dilatation. Pancreas: Moderate diffuse peripancreatic fat stranding and free fluid about the entirety of the pancreas. No ductal dilatation. Homogeneous pancreatic enhancement without evidence of necrosis. No walled-off fluid collection. Splenic vein is patent. Spleen: Small posterior cleft. Normal in size. Splenic vein is patent. Adrenals/Urinary Tract: Normal adrenal glands. No hydronephrosis or perinephric edema. Homogeneous renal enhancement with symmetric excretion on delayed phase imaging. Incidental note of 2 right and 2 left renal arteries. Urinary bladder is near completely empty. Stomach/Bowel: Stomach is decompressed. Minimal fluid in the distal esophagus. No bowel wall thickening, inflammatory change, or obstruction. Mild diverticulosis  of the descending colon without acute diverticulitis. Vascular/Lymphatic: Mild aortic atherosclerosis without aneurysm. Portal, splenic vein, and mesenteric veins are patent. No acute vascular findings. No enlarged lymph nodes in the abdomen or pelvis. Small peripancreatic nodes are likely reactive. Reproductive: Uterus and adnexa are unremarkable. Vaginal cyst is unchanged measuring 2.3 x 2.5 cm. Other: No free air. Peripancreatic fluid which slightly tracks in the mesentery. No walled-off fluid collection. Musculoskeletal: There are no acute or suspicious osseous abnormalities.  IMPRESSION: 1. Acute edematous pancreatitis. No pancreatic necrosis or walled-off collection. 2. Hepatomegaly and hepatic steatosis. 3. Minimal diverticulosis without diverticulitis. 4. Stable cystic lesion in the vagina. 5.  Aortic Atherosclerosis (ICD10-I70.0). Electronically Signed   By: Narda Rutherford M.D.   On: 08/19/2018 23:39   US Abdomen Limited Ruq  Result Date: 08/20/2018 CLINICAL DATA:  Alcoholic pancreatitis. EXAM: ULTRASOUND ABDOMEN LIMITED RIGHT UPPER QUADRANT COMPARISON:  Abdominopelvic CT 2 hours prior. FINDINGS: Gallbladder: Physiologically distended. No gallstones or wall thickening visualized. No sonographic Murphy sign noted by sonographer. Common bile duct: Diameter: 2 mm. Liver: No focal lesion identified. Diffusely increased in parenchymal echogenicity. Portal vein is patent on color Doppler imaging with normal direction of blood flow towards the liver. IMPRESSION: 1. No gallstones. Normal sonographic appearance of gallbladder and biliary tree. 2. Hepatic steatosis. Electronically Signed   By: Narda Rutherford M.D.   On: 08/20/2018 01:20    Scheduled Meds: . amLODipine  5 mg Oral Daily  . enoxaparin (LOVENOX) injection  40 mg Subcutaneous Daily  . folic acid  1 mg Oral Daily  . LORazepam  0-4 mg Intravenous Q6H   Followed by  . [START ON 08/22/2018] LORazepam  0-4 mg Intravenous Q12H  . multivitamin with minerals  1 tablet Oral Daily  . nicotine  21 mg Transdermal Daily  . thiamine  100 mg Oral Daily   Or  . thiamine  100 mg Intravenous Daily    Continuous Infusions: . sodium chloride 150 mL/hr at 08/20/18 1245     LOS: 0 days     Briant Cedar, MD [facility]  If 7PM-7AM, please contact night-coverage [email or web address] 08/20/2018, 7:01 PM

## 2018-08-20 NOTE — H&P (Signed)
History and Physical    Aaliyha Mumford UJW:[id number] DOB: 02-12-1971 DOA: 08/19/2018  Referring MD/NP/PA:   PCP: Loletta Specter, PA-C   Patient coming from:  The patient is coming from home.  At baseline, pt is independent for most of ADL.        Chief Complaint: Abdominal pain  HPI: Robin Fischer is a 47 y.o. female with medical history significant of tobacco abuse, alcohol abuse, hypertension, hyperlipidemia, asthma, GERD, who presents with abdominal pain  Patient states that her abdominal pain started this afternoon, which is located in the epigastric area, constant, sharp, severe, nonradiating.  It is associated with nausea.  She had one nonbilious nonbloody vomiting in the ED.  No diarrhea.  Denies fever or chills.  Patient does not have chest pain, shortness breath, cough, symptoms of UTI or unilateral weakness.  Patient states that she drinks alcohol, cocktail, 3-4 drinks every day.  ED Course: pt was found to have elevated lipase 3782, abnormal liver function (ALP 136, AST 175, ALT 69, total bilirubin 1.2), negative pregnancy test, negative troponin, potassium 2.1, creatinine normal, pending urinalysis, temperature 97.5, no tachycardia, no tachypnea, oxygen saturation 97% on room air.  CT of abdomen/pelvis showed edematous pancreatitis without necrosis or pseudocyst.  Patient is placed on MedSurg bed for observation.  Review of Systems:   General: no fevers, chills, no body weight gain, has poor appetite, has fatigue HEENT: no blurry vision, hearing changes or sore throat Respiratory: no dyspnea, coughing, wheezing CV: no chest pain, no palpitations GI: has nausea, vomiting, abdominal pain, no diarrhea, constipation GU: no dysuria, burning on urination, increased urinary frequency, hematuria  Ext: no leg edema Neuro: no unilateral weakness, numbness, or tingling, no vision change or hearing loss Skin: no rash, no skin tear. MSK: No muscle spasm, no deformity, no limitation  of range of movement in spin Heme: No easy bruising.  Travel history: No recent long distant travel.  Allergy: No Known Allergies  Past Medical History:  Diagnosis Date  . Asthma   . Bronchitis   . High cholesterol   . Hypertension   . Tobacco abuse     History reviewed. No pertinent surgical history.  Social History:  reports that she has been smoking. She has never used smokeless tobacco. She reports current alcohol use. She reports that she does not use drugs.  Family History:  Family History  Problem Relation Age of Onset  . Hypertension Mother   . Hyperlipidemia Mother   . Hypertension Father   . Hyperlipidemia Father      Prior to Admission medications   Medication Sig Start Date End Date Taking? Authorizing Provider  Acetaminophen (RA FEVER REDUCER/PAIN RELIEVER PO) Take 1 tablet by mouth daily as needed (for pain).    [provider]  hydrochlorothiazide (HYDRODIURIL) 25 MG tablet Take 1 tablet (25 mg total) by mouth daily. 06/07/18   Loletta Specter, PA-C  omeprazole (PRILOSEC) 40 MG capsule Take 1 capsule (40 mg total) by mouth daily. 06/07/18   Loletta Specter, PA-C  ondansetron (ZOFRAN ODT) 4 MG disintegrating tablet Take 1 tablet (4 mg total) by mouth every 8 (eight) hours as needed for nausea or vomiting. 05/06/18   Fawze, Mina A, PA-C  potassium chloride SA (K-DUR,KLOR-CON) 20 MEQ tablet Take 2 tablets (40 mEq total) by mouth daily for 7 doses. Patient not taking: Reported on 05/06/2018 09/13/17 05/06/18  Liberty Handy, PA-C  ranitidine (ZANTAC) 150 MG tablet Take 1 tablet (150 mg total) by  mouth 2 (two) times daily. 05/06/18   Jeanie Sewer, PA-C    Physical Exam: Vitals:   08/19/18 2157 08/19/18 2200 08/19/18 2334 08/20/18 0030  BP: (!) 148/111 (!) 168/90 (!) 159/90 138/68  Pulse: 76 69 67 79  Resp: 18  (!) 8 19  Temp: (!) 97.5 F (36.4 C)     TempSrc: Oral     SpO2: 100% 100% 99% 97%   General: Not in acute distress HEENT:       Eyes:  PERRL, EOMI, no scleral icterus.       ENT: No discharge from the ears and nose, no pharynx injection, no tonsillar enlargement.        Neck: No JVD, no bruit, no mass felt. Heme: No neck lymph node enlargement. Cardiac: S1/S2, RRR, No murmurs, No gallops or rubs. Respiratory:  No rales, wheezing, rhonchi or rubs. GI: Soft, nondistended, has tenderness in central and epigastric areas, no rebound pain, no organomegaly, BS present. GU: No hematuria Ext: No pitting leg edema bilaterally. 2+DP/PT pulse bilaterally. Musculoskeletal: No joint deformities, No joint redness or warmth, no limitation of ROM in spin. Skin: No rashes.  Neuro: Alert, oriented X3, cranial nerves II-XII grossly intact, moves all extremities normally.  Psych: Patient is not psychotic, no suicidal or hemocidal ideation.  Labs on Admission: I have personally reviewed following labs and imaging studies  CBC: Recent Labs  Lab 08/19/18 2203  WBC 13.3*  NEUTROABS 5.5  HGB 15.3*  HCT 44.3  MCV 81.1  PLT 283   Basic Metabolic Panel: Recent Labs  Lab 08/19/18 2203 08/19/18 2256  NA 139  --   K 2.1*  --   CL 95*  --   CO2 27  --   GLUCOSE 166*  --   BUN 5*  --   CREATININE 0.86  --   CALCIUM 9.3  --   MG  --  1.9   GFR: CrCl cannot be calculated (Unknown ideal weight.). Liver Function Tests: Recent Labs  Lab 08/19/18 2203  AST 175*  ALT 69*  ALKPHOS 136*  BILITOT 1.2  PROT 7.2  ALBUMIN 4.0   Recent Labs  Lab 08/19/18 2203  LIPASE 3,782*   No results for input(s): AMMONIA in the last 168 hours. Coagulation Profile: No results for input(s): INR, PROTIME in the last 168 hours. Cardiac Enzymes: No results for input(s): CKTOTAL, CKMB, CKMBINDEX, TROPONINI in the last 168 hours. BNP (last 3 results) No results for input(s): PROBNP in the last 8760 hours. HbA1C: No results for input(s): HGBA1C in the last 72 hours. CBG: No results for input(s): GLUCAP in the last 168 hours. Lipid  Profile: Recent Labs    08/20/18 0021  TRIG 343*   Thyroid Function Tests: No results for input(s): TSH, T4TOTAL, FREET4, T3FREE, THYROIDAB in the last 72 hours. Anemia Panel: No results for input(s): VITAMINB12, FOLATE, FERRITIN, TIBC, IRON, RETICCTPCT in the last 72 hours. Urine analysis:    Component Value Date/Time   COLORURINE YELLOW 05/06/2018 1054   APPEARANCEUR CLEAR 05/06/2018 1054   LABSPEC 1.013 05/06/2018 1054   PHURINE 6.0 05/06/2018 1054   GLUCOSEU NEGATIVE 05/06/2018 1054   HGBUR NEGATIVE 05/06/2018 1054   BILIRUBINUR NEGATIVE 05/06/2018 1054   BILIRUBINUR negative 01/18/2017 1658   KETONESUR NEGATIVE 05/06/2018 1054   PROTEINUR NEGATIVE 05/06/2018 1054   UROBILINOGEN 0.2 01/18/2017 1658   NITRITE NEGATIVE 05/06/2018 1054   LEUKOCYTESUR NEGATIVE 05/06/2018 1054   Sepsis Labs: @LABRCNTIP (procalcitonin:4,lacticidven:4) )No results found for this or any  previous visit (from the past 240 hour(s)).   Radiological Exams on Admission: Ct Abdomen Pelvis W Contrast  Result Date: 08/19/2018 CLINICAL DATA:  Pancreatitis suspected, atypical presentation/labs. Abdominal pain. Nausea and vomiting. EXAM: CT ABDOMEN AND PELVIS WITH CONTRAST TECHNIQUE: Multidetector CT imaging of the abdomen and pelvis was performed using the standard protocol following bolus administration of intravenous contrast. CONTRAST:  ISOVUE-300 IOPAMIDOL (ISOVUE-300) INJECTION 61% COMPARISON:  CT 09/13/2017 FINDINGS: Lower chest: Lung bases are clear. No consolidation. Hepatobiliary: Enlarged liver with right lobe spanning 21.7 cm. Advanced hepatic steatosis. No discrete focal lesion. Mild gallbladder distention without calcified gallstone or pericholecystic inflammation. No biliary dilatation. Pancreas: Moderate diffuse peripancreatic fat stranding and free fluid about the entirety of the pancreas. No ductal dilatation. Homogeneous pancreatic enhancement without evidence of necrosis. No walled-off  fluid collection. Splenic vein is patent. Spleen: Small posterior cleft. Normal in size. Splenic vein is patent. Adrenals/Urinary Tract: Normal adrenal glands. No hydronephrosis or perinephric edema. Homogeneous renal enhancement with symmetric excretion on delayed phase imaging. Incidental note of 2 right and 2 left renal arteries. Urinary bladder is near completely empty. Stomach/Bowel: Stomach is decompressed. Minimal fluid in the distal esophagus. No bowel wall thickening, inflammatory change, or obstruction. Mild diverticulosis of the descending colon without acute diverticulitis. Vascular/Lymphatic: Mild aortic atherosclerosis without aneurysm. Portal, splenic vein, and mesenteric veins are patent. No acute vascular findings. No enlarged lymph nodes in the abdomen or pelvis. Small peripancreatic nodes are likely reactive. Reproductive: Uterus and adnexa are unremarkable. Vaginal cyst is unchanged measuring 2.3 x 2.5 cm. Other: No free air. Peripancreatic fluid which slightly tracks in the mesentery. No walled-off fluid collection. Musculoskeletal: There are no acute or suspicious osseous abnormalities. IMPRESSION: 1. Acute edematous pancreatitis. No pancreatic necrosis or walled-off collection. 2. Hepatomegaly and hepatic steatosis. 3. Minimal diverticulosis without diverticulitis. 4. Stable cystic lesion in the vagina. 5.  Aortic Atherosclerosis (ICD10-I70.0). Electronically Signed   By: Narda Rutherford M.D.   On: 08/19/2018 23:39     EKG: Independently reviewed.  Sinus rhythm, QTC 434, LAE, T wave inversion in V2-V3  Assessment/Plan Principal Problem:   Alcoholic pancreatitis Active Problems:   Asthma   Hypertension   Hypokalemia   Tobacco abuse   Leukocytosis   Alcohol abuse   Abnormal LFTs   Alcoholic pancreatitis: lipase 1191. No pancreatic necrosis or pseudocyst formation by CT scan.  -will place in med-surg bed for observation -NPO for pancreatitis -IVF: 1LNS and then at 150  cc/hr -IV prn morphine for pain control -prn  IV hydroxyzine for nausea (patient has QT prolongation, cannot use Zofran) -US-RUQ -check triglyceride level to rule out triglyceridemia.  Abnormal LFTs: AST/ALT was 175/69 ratio is typical for alcohol abuse -Check hepatitis panel - HIV antibody -Avoid using liver toxic medications such as Tylenol  Asthma: Stable -PRN albuterol nebulizer  Hypertension: -hold HCTZ due to need of IVF -start amlodipine 5 mg daily -IV hydralazine PRN  Hypokalemia: K= 2.1 on admission. Mg 1.9 - Repleted - Give 1 g of magnesium sulfate  Tobacco abuse and Alcohol abuse: -Did counseling about importance of quitting smoking and drinking alcohol -Nicotine patch -CIWA protocol  Leukocytosis: no signs of infection. Likely due to stress induced to demargination -will follow up UA -follow up by CBC   DVT ppx:  SQ Lovenox Code Status: Full code Family Communication: None at bed side.    Disposition Plan:  Anticipate discharge back to previous home environment Consults called:  none Admission status:  medical floor/obs  Date of Service 08/20/2018    Lorretta HarpXilin Destine Ambroise [facility] Pager [phone]  If 7PM-7AM, please contact night-coverage [email or web address] Password [email or web address] 08/20/2018, 12:47 AM

## 2018-08-20 NOTE — Plan of Care (Signed)
  Problem: Activity: Goal: Ability to return to normal activity level will improve to the fullest extent possible by discharge Outcome: Progressing   

## 2018-08-21 DIAGNOSIS — R945 Abnormal results of liver function studies: Secondary | ICD-10-CM

## 2018-08-21 DIAGNOSIS — F1010 Alcohol abuse, uncomplicated: Secondary | ICD-10-CM

## 2018-08-21 LAB — COMPREHENSIVE METABOLIC PANEL
ALT: 36 U/L (ref 0–44)
AST: 43 U/L — ABNORMAL HIGH (ref 15–41)
Albumin: 3 g/dL — ABNORMAL LOW (ref 3.5–5.0)
Alkaline Phosphatase: 87 U/L (ref 38–126)
Anion gap: 9 (ref 5–15)
BUN: 9 mg/dL (ref 6–20)
CHLORIDE: 105 mmol/L (ref 98–111)
CO2: 27 mmol/L (ref 22–32)
Calcium: 8 mg/dL — ABNORMAL LOW (ref 8.9–10.3)
Creatinine, Ser: 0.73 mg/dL (ref 0.44–1.00)
GFR calc Af Amer: >60 mL/min (ref >60)
GFR calc non Af Amer: >60 mL/min (ref >60)
Glucose, Bld: 95 mg/dL (ref 70–99)
Potassium: 3 mmol/L — ABNORMAL LOW (ref 3.5–5.1)
Sodium: 141 mmol/L (ref 135–145)
Total Bilirubin: 1.3 mg/dL — ABNORMAL HIGH (ref 0.3–1.2)
Total Protein: 5.7 g/dL — ABNORMAL LOW (ref 6.5–8.1)

## 2018-08-21 LAB — CBC WITH DIFFERENTIAL/PLATELET
ABS IMMATURE GRANULOCYTES: 0.11 10*3/uL — AB (ref 0.00–0.07)
Basophils Absolute: 0.1 10*3/uL (ref 0.0–0.1)
Basophils Relative: 0 %
Eosinophils Absolute: 0.2 10*3/uL (ref 0.0–0.5)
Eosinophils Relative: 1 %
HCT: 42.5 % (ref 36.0–46.0)
Hemoglobin: 14.4 g/dL (ref 12.0–15.0)
Immature Granulocytes: 1 %
Lymphocytes Relative: 14 %
Lymphs Abs: 2.3 10*3/uL (ref 0.7–4.0)
MCH: 28.5 pg (ref 26.0–34.0)
MCHC: 33.9 g/dL (ref 30.0–36.0)
MCV: 84.2 fL (ref 80.0–100.0)
Monocytes Absolute: 1 10*3/uL (ref 0.1–1.0)
Monocytes Relative: 6 %
NEUTROS ABS: 13.4 10*3/uL — AB (ref 1.7–7.7)
NRBC: 0 % (ref 0.0–0.2)
Neutrophils Relative %: 78 %
Platelets: 180 10*3/uL (ref 150–400)
RBC: 5.05 MIL/uL (ref 3.87–5.11)
RDW: 14.3 % (ref 11.5–15.5)
WBC: 17 10*3/uL — ABNORMAL HIGH (ref 4.0–10.5)

## 2018-08-21 LAB — HEPATITIS PANEL, ACUTE
Hep A IgM: NEGATIVE
Hep B C IgM: NEGATIVE
Hepatitis B Surface Ag: NEGATIVE

## 2018-08-21 LAB — LIPASE, BLOOD: Lipase: 263 U/L — ABNORMAL HIGH (ref 11–51)

## 2018-08-21 LAB — MAGNESIUM: Magnesium: 2.2 mg/dL (ref 1.7–2.4)

## 2018-08-21 MED ORDER — POTASSIUM CHLORIDE 10 MEQ/100ML IV SOLN
10.0000 meq | INTRAVENOUS | Status: AC
  Administered 2018-08-21 – 2018-08-22 (×6): 10 meq via INTRAVENOUS
  Filled 2018-08-21 (×2): qty 100

## 2018-08-21 NOTE — Progress Notes (Signed)
Initial Nutrition Assessment  INTERVENTION:   Once diet advanced, provide Ensure Enlive po BID, each supplement provides 350 kcal and 20 grams of protein  NUTRITION DIAGNOSIS:   Increased nutrient needs related to (ETOH cirrhosis) as evidenced by estimated needs.  GOAL:   Patient will meet greater than or equal to 90% of their needs  MONITOR:   Diet advancement, Labs, Weight trends, I & O's  REASON FOR ASSESSMENT:   Malnutrition Screening Tool    ASSESSMENT:   Robin Jacobsonvelyn Chapmanis a 47 y.o.femalewith medical history significant oftobacco abuse, alcohol abuse, hypertension, hyperlipidemia, asthma, GERD, who presents with abdominal pain. Patient states thatherabdominal pain is located in the epigastric area, constant, sharp, severe, nonradiating. It is associated with nausea  Patient currently NPO as pt was having N/V and abdominal pain PTA. Was not eating well d/t these symptoms. Pt with ETOH abuse history, reported drinking 3-4 cocktails daily. Would benefit from nutritional supplementation once diet is advanced.  Staff weighed patient today and pt weighs 210 lb. Weight has increased by 20 lbs since June 2019.   Medications: folic acid tablet daily, Multivitamin with minerals daily, Thiamine tablet daily Labs reviewed: Low K   NUTRITION - FOCUSED PHYSICAL EXAM:  Nutrition focused physical exam shows no sign of depletion of muscle mass or body fat.  Diet Order:   Diet Order            Diet NPO time specified Except for: Sips with Meds, Ice Chips  Diet effective now              EDUCATION NEEDS:   Not appropriate for education at this time  Skin:  Skin Assessment: Reviewed RN Assessment  Last BM:  12/15  Height:   Ht Readings from Last 1 Encounters:  05/06/18 5\' 7"  (1.702 m)    Weight:   Wt Readings from Last 1 Encounters:  08/21/18 95.5 kg    Ideal Body Weight:  61.3 kg  BMI:  Body mass index is 32.98 kg/m.  Estimated Nutritional Needs:    Kcal:  1900-2100  Protein:  85-95g  Fluid:  2L/day   Robin FrancoLindsey Montserrat Shek, MS, RD, LDN [facility] Clinical Dietitian Pager: [phone] After Hours Pager: [phone]

## 2018-08-21 NOTE — Progress Notes (Signed)
PROGRESS NOTE  Edit Robin Fischer:[id number] DOB: 27-Jun-1971 DOA: 08/19/2018 PCP: Robin Specter, PA-C  HPI/Recap of past 24 hours: HPI from Dr Robin Fischer is a 47 y.o. female with medical history significant of tobacco abuse, alcohol abuse, hypertension, hyperlipidemia, asthma, GERD, who presents with abdominal pain. Patient states that her abdominal pain is located in the epigastric area, constant, sharp, severe, nonradiating.  It is associated with nausea.  She had one nonbilious nonbloody vomiting in the ED.  No diarrhea.  Denies fever or chills.  Patient does not have chest pain, shortness breath, cough, symptoms of UTI or unilateral weakness.  Patient states that she drinks alcohol, cocktail, 3-4 drinks every day. In the ED, pt was found to have elevated lipase 3782, abnormal liver function (ALP 136, AST 175, ALT 69, total bilirubin 1.2), negative pregnancy test, negative troponin, potassium 2.1. CT of abdomen/pelvis showed edematous pancreatitis without necrosis or pseudocyst.  Patient admitted for further management.   Today, patient still with significant abdominal pain. Reports resolving N/V.  Assessment/Plan: Principal Problem:   Alcoholic pancreatitis Active Problems:   Asthma   Hypertension   Hypokalemia   Tobacco abuse   Leukocytosis   Alcohol abuse   Abnormal LFTs   Acute pancreatitis   Alcoholic pancreatitis with elevated LFTs Elevated lipase 3782-->991-->263 Abnormal LFTs, likely due to alcohol abuse Hepatitis panel pending CT abdomen showed pancreatitis without necrosis or pseudocyst Ultrasound right upper quadrant showed hepatic steatosis, no gallstones and normal appearance of gallbladder and biliary tree N.p.o., IV fluids, pain management IV hydroxyzine for nausea as patient has QT prolongation Patient advised to quit alcohol  Hypokalemia Replace PRN  Leukocytosis Likely reactive, trending up Repeat CBC in a.m, if still trending up, may  require further work up  Hypertension Started on amlodipine here in the hospital Continue to hold HCTZ IV hydralazine as needed  Asthma Stable Albuterol nebulizer prn  Tobacco and alcohol abuse Advised to quit both Nicotine patch CIWA protocol        Malnutrition Type:  Nutrition Problem: Increased nutrient needs Etiology: (ETOH cirrhosis)   Malnutrition Characteristics:  Signs/Symptoms: estimated needs   Nutrition Interventions:  Interventions: Refer to RD note for recommendations    Estimated body mass index is 32.98 kg/m as calculated from the following:   Height as of 05/06/18: 5\' 7"  (1.702 m).   Weight as of this encounter: 95.5 kg.     Code Status: Full  Family Communication: None at bedside  Disposition Plan: Home once stable   Consultants:  None  Procedures:  None  Antimicrobials:  None  DVT prophylaxis: Lovenox   Objective: Vitals:   08/20/18 1356 08/20/18 2347 08/21/18 0547 08/21/18 1143  BP: (!) 144/92 134/70 (!) 134/96 (!) 143/80  Pulse: 95 93 93 93  Resp: 20  17 16   Temp: 99 F (37.2 C)  99 F (37.2 C) 99.1 F (37.3 C)  TempSrc: Oral   Oral  SpO2: 93%  96% 93%  Weight:    95.5 kg    Intake/Output Summary (Last 24 hours) at 08/21/2018 1736 Last data filed at 08/21/2018 1100 Gross per 24 hour  Intake 2365.96 ml  Output 150 ml  Net 2215.96 ml   Filed Weights   08/21/18 1143  Weight: 95.5 kg    Exam:  General: NAD   Cardiovascular: S1, S2 present  Respiratory: CTAB  Abdomen: Soft, generalized tenderness, nondistended, bowel sounds present  Musculoskeletal: No bilateral pedal edema noted  Skin: Normal  Psychiatry: Normal mood  Data Reviewed: CBC: Recent Labs  Lab 08/19/18 2203 08/21/18 0701  WBC 13.3* 17.0*  NEUTROABS 5.5 13.4*  HGB 15.3* 14.4  HCT 44.3 42.5  MCV 81.1 84.2  PLT 283 180   Basic Metabolic Panel: Recent Labs  Lab 08/19/18 2203 08/19/18 2256 08/20/18 0553 08/21/18 0701    NA 139  --  141 141  K 2.1*  --  3.0* 3.0*  CL 95*  --  100 105  CO2 27  --  27 27  GLUCOSE 166*  --  129* 95  BUN 5*  --  6 9  CREATININE 0.86  --  0.80 0.73  CALCIUM 9.3  --  8.6* 8.0*  MG  --  1.9  --  2.2   GFR: Estimated Creatinine Clearance: 101 mL/min (by C-G formula based on SCr of 0.73 mg/dL). Liver Function Tests: Recent Labs  Lab 08/19/18 2203 08/20/18 0553 08/21/18 0701  AST 175* 104* 43*  ALT 69* 58* 36  ALKPHOS 136* 119 87  BILITOT 1.2 0.9 1.3*  PROT 7.2 6.4* 5.7*  ALBUMIN 4.0 3.7 3.0*   Recent Labs  Lab 08/19/18 2203 08/20/18 0553 08/21/18 0701  LIPASE 3,782* 991* 263*   No results for input(s): AMMONIA in the last 168 hours. Coagulation Profile: No results for input(s): INR, PROTIME in the last 168 hours. Cardiac Enzymes: No results for input(s): CKTOTAL, CKMB, CKMBINDEX, TROPONINI in the last 168 hours. BNP (last 3 results) No results for input(s): PROBNP in the last 8760 hours. HbA1C: No results for input(s): HGBA1C in the last 72 hours. CBG: No results for input(s): GLUCAP in the last 168 hours. Lipid Profile: Recent Labs    08/20/18 0021  TRIG 343*   Thyroid Function Tests: No results for input(s): TSH, T4TOTAL, FREET4, T3FREE, THYROIDAB in the last 72 hours. Anemia Panel: No results for input(s): VITAMINB12, FOLATE, FERRITIN, TIBC, IRON, RETICCTPCT in the last 72 hours. Urine analysis:    Component Value Date/Time   COLORURINE YELLOW 05/06/2018 1054   APPEARANCEUR CLEAR 05/06/2018 1054   LABSPEC 1.013 05/06/2018 1054   PHURINE 6.0 05/06/2018 1054   GLUCOSEU NEGATIVE 05/06/2018 1054   HGBUR NEGATIVE 05/06/2018 1054   BILIRUBINUR NEGATIVE 05/06/2018 1054   BILIRUBINUR negative 01/18/2017 1658   KETONESUR NEGATIVE 05/06/2018 1054   PROTEINUR NEGATIVE 05/06/2018 1054   UROBILINOGEN 0.2 01/18/2017 1658   NITRITE NEGATIVE 05/06/2018 1054   LEUKOCYTESUR NEGATIVE 05/06/2018 1054   Sepsis  Labs: @LABRCNTIP (procalcitonin:4,lacticidven:4)  )No results found for this or any previous visit (from the past 240 hour(s)).    Studies: No results found.  Scheduled Meds: . amLODipine  5 mg Oral Daily  . enoxaparin (LOVENOX) injection  40 mg Subcutaneous Daily  . folic acid  1 mg Oral Daily  . LORazepam  0-4 mg Intravenous Q6H   Followed by  . [START ON 08/22/2018] LORazepam  0-4 mg Intravenous Q12H  . multivitamin with minerals  1 tablet Oral Daily  . nicotine  21 mg Transdermal Daily  . thiamine  100 mg Oral Daily   Or  . thiamine  100 mg Intravenous Daily    Continuous Infusions: . sodium chloride 150 mL/hr at 08/21/18 0600  . potassium chloride 10 mEq (08/21/18 1639)     LOS: 1 day     Briant CedarNkeiruka J Ezenduka, MD [facility]  If 7PM-7AM, please contact night-coverage [email or web address] 08/21/2018, 5:36 PM

## 2018-08-22 ENCOUNTER — Inpatient Hospital Stay (HOSPITAL_COMMUNITY): Payer: Medicaid Other

## 2018-08-22 DIAGNOSIS — R0600 Dyspnea, unspecified: Secondary | ICD-10-CM

## 2018-08-22 LAB — CBC WITH DIFFERENTIAL/PLATELET
Abs Immature Granulocytes: 0.06 10*3/uL (ref 0.00–0.07)
Basophils Absolute: 0.1 10*3/uL (ref 0.0–0.1)
Basophils Relative: 0 %
Eosinophils Absolute: 0.4 10*3/uL (ref 0.0–0.5)
Eosinophils Relative: 3 %
HCT: 42.2 % (ref 36.0–46.0)
Hemoglobin: 14.4 g/dL (ref 12.0–15.0)
Immature Granulocytes: 0 %
Lymphocytes Relative: 23 %
Lymphs Abs: 3.7 10*3/uL (ref 0.7–4.0)
MCH: 28.1 pg (ref 26.0–34.0)
MCHC: 34.1 g/dL (ref 30.0–36.0)
MCV: 82.4 fL (ref 80.0–100.0)
Monocytes Absolute: 0.9 10*3/uL (ref 0.1–1.0)
Monocytes Relative: 6 %
Neutro Abs: 11 10*3/uL — ABNORMAL HIGH (ref 1.7–7.7)
Neutrophils Relative %: 68 %
Platelets: 217 10*3/uL (ref 150–400)
RBC: 5.12 MIL/uL — ABNORMAL HIGH (ref 3.87–5.11)
RDW: 14.1 % (ref 11.5–15.5)
WBC: 16.1 10*3/uL — ABNORMAL HIGH (ref 4.0–10.5)
nRBC: 0 % (ref 0.0–0.2)

## 2018-08-22 LAB — COMPREHENSIVE METABOLIC PANEL
ALBUMIN: 3 g/dL — AB (ref 3.5–5.0)
ALT: 30 U/L (ref 0–44)
AST: 45 U/L — ABNORMAL HIGH (ref 15–41)
Alkaline Phosphatase: 93 U/L (ref 38–126)
Anion gap: 9 (ref 5–15)
BUN: 8 mg/dL (ref 6–20)
CO2: 26 mmol/L (ref 22–32)
Calcium: 8.2 mg/dL — ABNORMAL LOW (ref 8.9–10.3)
Chloride: 103 mmol/L (ref 98–111)
Creatinine, Ser: 0.64 mg/dL (ref 0.44–1.00)
GFR calc Af Amer: >60 mL/min (ref >60)
GFR calc non Af Amer: >60 mL/min (ref >60)
GLUCOSE: 83 mg/dL (ref 70–99)
Potassium: 3.2 mmol/L — ABNORMAL LOW (ref 3.5–5.1)
SODIUM: 138 mmol/L (ref 135–145)
Total Bilirubin: 1.6 mg/dL — ABNORMAL HIGH (ref 0.3–1.2)
Total Protein: 6.2 g/dL — ABNORMAL LOW (ref 6.5–8.1)

## 2018-08-22 LAB — LIPASE, BLOOD: Lipase: 70 U/L — ABNORMAL HIGH (ref 11–51)

## 2018-08-22 MED ORDER — POTASSIUM CHLORIDE 20 MEQ/15ML (10%) PO SOLN
20.0000 meq | Freq: Two times a day (BID) | ORAL | Status: DC
  Administered 2018-08-22: 20 meq via ORAL
  Filled 2018-08-22: qty 15

## 2018-08-22 MED ORDER — POTASSIUM CHLORIDE 10 MEQ/100ML IV SOLN
10.0000 meq | INTRAVENOUS | Status: AC
  Administered 2018-08-22 (×6): 10 meq via INTRAVENOUS
  Filled 2018-08-22 (×6): qty 100

## 2018-08-22 NOTE — Progress Notes (Signed)
[facility] PROGRESS NOTE    Progress Note  Robin Lawsvelyn Point  ZOX:[id number] DOB: 11/21/1970 DOA: 08/19/2018 PCP: Loletta SpecterGomez, Roger David, PA-C     Brief Narrative:   Robin Fischer is an 47 y.o. female past medical history significant for tobacco alcohol, essential hypertension who presents with abdominal pain, located in the epigastric area constant and sharp nonradiating lipase was done that was greater than 3000 on admission.  Likely due to pancreatitis  Assessment/Plan:   Alcoholic pancreatitis/elevated LFT's: Discontinue lipase labs daily.  LFTs are split to the wound likely due to alcohol abuse. CT scan of the abdomen and pelvis on 08/19/2018 showed Acute edematous pancreatitis. No pancreatic necrosis or walled-off collection. The patient n.p.o., continue IV fluids, I will not allowed ice chips. Continue narcotics for pain her pain is not controlled. His bowel movement was 4 days ago, will give her tap water enema.  Her abdomen appears distended. As she has a little bit of dyspnea when she lays flat in the bed. Get a chest x-ray two-view. Give a tap water enema.  Hypokalemia: Replete orally, recheck in the morning.  Leukocytosis: Likely stress demargination. She has remained afebrile. There is slowly improving.  Essential hypertension: HCTZ was held on admission. Continue amlodipine and hydralazine as needed.  asthma Stable.   DVT prophylaxis: lovenox Family Communication:none Disposition Plan/Barrier to D/C: once acute pancreatitis resolved. Code Status:     Code Status Orders  (From admission, onward)         Start     Ordered   08/20/18 0022  Full code  Continuous     08/20/18 0022        Code Status History    This patient has a current code status but no historical code status.        IV Access:    Peripheral IV   Procedures and diagnostic studies:   No results found.   Medical Consultants:    None.  Anti-Infectives:    NOne  Subjective:    Robin Lawsvelyn Kitamura relates she continues to have abdominal pain she is anorexic has not had a bowel movement in 4 days.  Objective:    Vitals:   08/21/18 0547 08/21/18 1143 08/21/18 2041 08/22/18 0536  BP: (!) 134/96 (!) 143/80 136/75 138/84  Pulse: 93 93 97 94  Resp: 17 16 15 16   Temp: 99 F (37.2 C) 99.1 F (37.3 C) 98.7 F (37.1 C) 98.4 F (36.9 C)  TempSrc:  Oral Oral Oral  SpO2: 96% 93% 100% 96%  Weight:  95.5 kg      Intake/Output Summary (Last 24 hours) at 08/22/2018 0814 Last data filed at 08/22/2018 0400 Gross per 24 hour  Intake 2092.01 ml  Output 100 ml  Net 1992.01 ml   Filed Weights   08/21/18 1143  Weight: 95.5 kg    Exam: General exam: In no acute distress. Respiratory system: Good air movement and clear to auscultation Cardiovascular system: S1-S2 with a regular rate and rhythm, no JVD Gastrointestinal system: Abdomen is significantly distended, soft no rebound tenderness but epigastric tenderness Central nervous system: Alert and oriented x3 nonfocal Extremities: Trace edema Skin: No rashes, lesions or ulcers Psychiatry: Judgement and insight appear normal. Mood & affect appropriate.    Data Reviewed:    Labs: Basic Metabolic Panel: Recent Labs  Lab 08/19/18 2203 08/19/18 2256 08/20/18 0553 08/21/18 0701 08/22/18 0645  NA 139  --  141 141 138  K 2.1*  --  3.0* 3.0* 3.2*  CL 95*  --  100 105 103  CO2 27  --  27 27 26   GLUCOSE 166*  --  129* 95 83  BUN 5*  --  6 9 8   CREATININE 0.86  --  0.80 0.73 0.64  CALCIUM 9.3  --  8.6* 8.0* 8.2*  MG  --  1.9  --  2.2  --    GFR Estimated Creatinine Clearance: 101 mL/min (by C-G formula based on SCr of 0.64 mg/dL). Liver Function Tests: Recent Labs  Lab 08/19/18 2203 08/20/18 0553 08/21/18 0701 08/22/18 0645  AST 175* 104* 43* 45*  ALT 69* 58* 36 30  ALKPHOS 136* 119 87 93  BILITOT 1.2 0.9 1.3* 1.6*  PROT 7.2 6.4* 5.7* 6.2*  ALBUMIN 4.0 3.7 3.0* 3.0*    Recent Labs  Lab 08/19/18 2203 08/20/18 0553 08/21/18 0701 08/22/18 0645  LIPASE 3,782* 991* 263* 70*   No results for input(s): AMMONIA in the last 168 hours. Coagulation profile No results for input(s): INR, PROTIME in the last 168 hours.  CBC: Recent Labs  Lab 08/19/18 2203 08/21/18 0701 08/22/18 0645  WBC 13.3* 17.0* 16.1*  NEUTROABS 5.5 13.4* 11.0*  HGB 15.3* 14.4 14.4  HCT 44.3 42.5 42.2  MCV 81.1 84.2 82.4  PLT 283 180 217   Cardiac Enzymes: No results for input(s): CKTOTAL, CKMB, CKMBINDEX, TROPONINI in the last 168 hours. BNP (last 3 results) No results for input(s): PROBNP in the last 8760 hours. CBG: No results for input(s): GLUCAP in the last 168 hours. D-Dimer: No results for input(s): DDIMER in the last 72 hours. Hgb A1c: No results for input(s): HGBA1C in the last 72 hours. Lipid Profile: Recent Labs    08/20/18 0021  TRIG 343*   Thyroid function studies: No results for input(s): TSH, T4TOTAL, T3FREE, THYROIDAB in the last 72 hours.  Invalid input(s): FREET3 Anemia work up: No results for input(s): VITAMINB12, FOLATE, FERRITIN, TIBC, IRON, RETICCTPCT in the last 72 hours. Sepsis Labs: Recent Labs  Lab 08/19/18 2203 08/21/18 0701 08/22/18 0645  WBC 13.3* 17.0* 16.1*   Microbiology No results found for this or any previous visit (from the past 240 hour(s)).   Medications:   . amLODipine  5 mg Oral Daily  . enoxaparin (LOVENOX) injection  40 mg Subcutaneous Daily  . folic acid  1 mg Oral Daily  . LORazepam  0-4 mg Intravenous Q12H  . multivitamin with minerals  1 tablet Oral Daily  . nicotine  21 mg Transdermal Daily  . thiamine  100 mg Oral Daily   Or  . thiamine  100 mg Intravenous Daily   Continuous Infusions: . sodium chloride 150 mL/hr at 08/21/18 0600      LOS: 2 days   Marinda Elk  [facility]   *Please refer to [email or web address], password [email or web address] to get updated schedule on who will round on this patient, as  hospitalists switch teams weekly. If 7PM-7AM, please contact night-coverage at [email or web address], password [email or web address] for any overnight needs.  08/22/2018, 8:14 AM

## 2018-08-23 LAB — BASIC METABOLIC PANEL
Anion gap: 9 (ref 5–15)
BUN: 5 mg/dL — ABNORMAL LOW (ref 6–20)
CHLORIDE: 105 mmol/L (ref 98–111)
CO2: 24 mmol/L (ref 22–32)
Calcium: 8.2 mg/dL — ABNORMAL LOW (ref 8.9–10.3)
Creatinine, Ser: 0.65 mg/dL (ref 0.44–1.00)
GFR calc Af Amer: >60 mL/min (ref >60)
GFR calc non Af Amer: >60 mL/min (ref >60)
Glucose, Bld: 74 mg/dL (ref 70–99)
Potassium: 3.5 mmol/L (ref 3.5–5.1)
Sodium: 138 mmol/L (ref 135–145)

## 2018-08-23 LAB — HIV ANTIBODY (ROUTINE TESTING W REFLEX)

## 2018-08-23 NOTE — Progress Notes (Signed)
BP= 156/87. Patient has history of HTN, alcohol abuse. Pain medication was given as MD ordered. RN will recheck BP.

## 2018-08-23 NOTE — Progress Notes (Signed)
BP= 170/82. Patient has history of HTN. Patient is NPO now. IV hydralazine was given as MD ordered.

## 2018-08-23 NOTE — Progress Notes (Signed)
[facility] PROGRESS NOTE    Progress Note  Robin Fischer  BJY:[id number] DOB: [birth year] DOA: 08/19/2018 PCP: Loletta Specter, PA-C     Brief Narrative:   Robin Fischer is an 47 y.o. female past medical history significant for tobacco alcohol, essential hypertension who presents with abdominal pain, located in the epigastric area constant and sharp nonradiating lipase was done that was greater than 3000 on admission.  Likely due to pancreatitis  Assessment/Plan:   Alcoholic pancreatitis/elevated LFT's: LFT's are split to the wound likely due to alcohol abuse. CT scan of the abdomen and pelvis on 08/19/2018 showed Acute pancreatitis. The patient n.p.o.,  Chest x-ray shows slight pulmonary edema. KVO IV fluids, continue narcotics for pain. She had several bowel movement after a tap water enema. Complaining of pain, with radiation to her back.  Hypokalemia: Replete orally, recheck in the morning.  Leukocytosis: Likely stress demargination. She has remained afebrile. There is slowly improving.  Essential hypertension: HCTZ was held on admission. Continue amlodipine and hydralazine as needed.  asthma Stable.   DVT prophylaxis: lovenox Family Communication:none Disposition Plan/Barrier to D/C: once acute pancreatitis resolved. Code Status:     Code Status Orders  (From admission, onward)         Start     Ordered   08/20/18 0022  Full code  Continuous     08/20/18 0022        Code Status History    This patient has a current code status but no historical code status.        IV Access:    Peripheral IV   Procedures and diagnostic studies:   Dg Chest 2 View  Result Date: 08/22/2018 CLINICAL DATA:  Epigastric abdominal pain, elevated lipase. Probable pancreatitis. EXAM: CHEST - 2 VIEW COMPARISON:  None. FINDINGS: The lungs are reasonably well inflated. There is a moderate-sized left pleural effusion. I can on exclude left basilar  atelectasis or pneumonia. The interstitial markings of both lungs are coarse. The heart is top-normal in size. The pulmonary vascularity is not clearly engorged. The trachea is midline. The bony thorax exhibits no acute abnormality. IMPRESSION: Left lower lobe atelectasis or pneumonia with small left pleural effusion. Increased interstitial markings in both lungs may reflect acute or chronic bronchitic changes. No overt pulmonary edema. Electronically Signed   By: David  [place] M.D.   On: 08/22/2018 14:11     Medical Consultants:    None.  Anti-Infectives:   NOne  Subjective:    Robin Fischer relates she continues to have abdominal pain she is anorexic has not had a bowel movement in 4 days.  Objective:    Vitals:   08/22/18 0959 08/22/18 1335 08/22/18 2212 08/23/18 0537  BP: (!) 153/90 (!) 157/90 (!) 144/75 (!) 156/87  Pulse: 91 96 91 86  Resp:  17 16 16   Temp:  98.9 F (37.2 C) 99.3 F (37.4 C) 99.2 F (37.3 C)  TempSrc:  Oral Oral Oral  SpO2: 93% 96% 95% 99%  Weight:        Intake/Output Summary (Last 24 hours) at 08/23/2018 0820 Last data filed at 08/23/2018 0731 Gross per 24 hour  Intake 2570.51 ml  Output -  Net 2570.51 ml   Filed Weights   08/21/18 1143  Weight: 95.5 kg    Exam: General exam: In no acute distress. Respiratory system: Good air movement and clear to auscultation Cardiovascular system: S1-S2 with a regular rate and rhythm, no JVD Gastrointestinal system: Abdomen is significantly distended,  soft no rebound tenderness but epigastric tenderness Central nervous system: Alert and oriented x3 nonfocal Extremities: Trace edema Skin: No rashes, lesions or ulcers Psychiatry: Judgement and insight appear normal. Mood & affect appropriate.    Data Reviewed:    Labs: Basic Metabolic Panel: Recent Labs  Lab 08/19/18 2203 08/19/18 2256 08/20/18 0553 08/21/18 0701 08/22/18 0645 08/23/18 0610  NA 139  --  141 141 138 138  K 2.1*  --  3.0*  3.0* 3.2* 3.5  CL 95*  --  100 105 103 105  CO2 27  --  27 27 26 24   GLUCOSE 166*  --  129* 95 83 74  BUN 5*  --  6 9 8  5*  CREATININE 0.86  --  0.80 0.73 0.64 0.65  CALCIUM 9.3  --  8.6* 8.0* 8.2* 8.2*  MG  --  1.9  --  2.2  --   --    GFR Estimated Creatinine Clearance: 101 mL/min (by C-G formula based on SCr of 0.65 mg/dL). Liver Function Tests: Recent Labs  Lab 08/19/18 2203 08/20/18 0553 08/21/18 0701 08/22/18 0645  AST 175* 104* 43* 45*  ALT 69* 58* 36 30  ALKPHOS 136* 119 87 93  BILITOT 1.2 0.9 1.3* 1.6*  PROT 7.2 6.4* 5.7* 6.2*  ALBUMIN 4.0 3.7 3.0* 3.0*   Recent Labs  Lab 08/19/18 2203 08/20/18 0553 08/21/18 0701 08/22/18 0645  LIPASE 3,782* 991* 263* 70*   No results for input(s): AMMONIA in the last 168 hours. Coagulation profile No results for input(s): INR, PROTIME in the last 168 hours.  CBC: Recent Labs  Lab 08/19/18 2203 08/21/18 0701 08/22/18 0645  WBC 13.3* 17.0* 16.1*  NEUTROABS 5.5 13.4* 11.0*  HGB 15.3* 14.4 14.4  HCT 44.3 42.5 42.2  MCV 81.1 84.2 82.4  PLT 283 180 217   Cardiac Enzymes: No results for input(s): CKTOTAL, CKMB, CKMBINDEX, TROPONINI in the last 168 hours. BNP (last 3 results) No results for input(s): PROBNP in the last 8760 hours. CBG: No results for input(s): GLUCAP in the last 168 hours. D-Dimer: No results for input(s): DDIMER in the last 72 hours. Hgb A1c: No results for input(s): HGBA1C in the last 72 hours. Lipid Profile: No results for input(s): CHOL, HDL, LDLCALC, TRIG, CHOLHDL, LDLDIRECT in the last 72 hours. Thyroid function studies: No results for input(s): TSH, T4TOTAL, T3FREE, THYROIDAB in the last 72 hours.  Invalid input(s): FREET3 Anemia work up: No results for input(s): VITAMINB12, FOLATE, FERRITIN, TIBC, IRON, RETICCTPCT in the last 72 hours. Sepsis Labs: Recent Labs  Lab 08/19/18 2203 08/21/18 0701 08/22/18 0645  WBC 13.3* 17.0* 16.1*   Microbiology No results found for this or any  previous visit (from the past 240 hour(s)).   Medications:   . amLODipine  5 mg Oral Daily  . enoxaparin (LOVENOX) injection  40 mg Subcutaneous Daily  . folic acid  1 mg Oral Daily  . LORazepam  0-4 mg Intravenous Q12H  . multivitamin with minerals  1 tablet Oral Daily  . nicotine  21 mg Transdermal Daily  . thiamine  100 mg Oral Daily   Or  . thiamine  100 mg Intravenous Daily   Continuous Infusions: . sodium chloride 150 mL/hr at 08/23/18 0805      LOS: 3 days   Marinda ElkAbraham Feliz Ortiz  [facility]   *Please refer to [email or web address], password [email or web address] to get updated schedule on who will round on this patient, as hospitalists switch teams weekly. If 7PM-7AM, please  contact night-coverage at [email or web address], password [email or web address] for any overnight needs.  08/23/2018, 8:20 AM

## 2018-08-24 LAB — BASIC METABOLIC PANEL
Anion gap: 17 — ABNORMAL HIGH (ref 5–15)
BUN: 5 mg/dL — ABNORMAL LOW (ref 6–20)
CO2: 21 mmol/L — ABNORMAL LOW (ref 22–32)
Calcium: 8.5 mg/dL — ABNORMAL LOW (ref 8.9–10.3)
Chloride: 101 mmol/L (ref 98–111)
Creatinine, Ser: 0.55 mg/dL (ref 0.44–1.00)
GFR calc Af Amer: >60 mL/min (ref >60)
GFR calc non Af Amer: >60 mL/min (ref >60)
GLUCOSE: 76 mg/dL (ref 70–99)
Potassium: 3.3 mmol/L — ABNORMAL LOW (ref 3.5–5.1)
Sodium: 139 mmol/L (ref 135–145)

## 2018-08-24 MED ORDER — POTASSIUM CHLORIDE 10 MEQ/100ML IV SOLN
10.0000 meq | INTRAVENOUS | Status: AC
  Administered 2018-08-24 (×5): 10 meq via INTRAVENOUS
  Filled 2018-08-24 (×5): qty 100

## 2018-08-24 MED ORDER — ONDANSETRON HCL 4 MG/2ML IJ SOLN: 4.0000 mg | Freq: Four times a day (QID) | INTRAMUSCULAR | Status: DC | PRN

## 2018-08-24 NOTE — Progress Notes (Signed)
[facility] PROGRESS NOTE    Progress Note  Robin Fischer  [id number] DOB: November 13, 1970 DOA: 08/19/2018 PCP: Loletta SpecterGomez, Roger David, PA-C     Brief Narrative:   Robin Lawsvelyn Smigel is an 47 y.o. female past medical history significant for tobacco alcohol, essential hypertension who presents with abdominal pain, located in the epigastric area constant and sharp nonradiating lipase was done that was greater than 3000 on admission.  Likely due to pancreatitis  Assessment/Plan:   Alcoholic pancreatitis/elevated LFT's: LFT's are split to the wound likely due to alcohol abuse. CT scan of the abdomen and pelvis on 08/19/2018 showed Acute pancreatitis. Patient relates she did not require pain medications overnight. She will like to try clear liquid diet.  She also like to go slowly on the clear liquid diet.  Avoid oral narcotics.  I have explained to her that if she develops abdominal pain we will have to place her n.p.o. and go back on IV narcotics. KVO IV fluids.  Hypokalemia: Replete IV recheck in the morning.  Leukocytosis: Likely stress demargination. She has remained afebrile.  Essential hypertension: HCTZ was held on admission. Continue amlodipine and hydralazine as needed.  asthma Stable.   DVT prophylaxis: lovenox Family Communication:none Disposition Plan/Barrier to D/C: once acute pancreatitis resolved. Code Status:     Code Status Orders  (From admission, onward)         Start     Ordered   08/20/18 0022  Full code  Continuous     08/20/18 0022        Code Status History    This patient has a current code status but no historical code status.        IV Access:    Peripheral IV   Procedures and diagnostic studies:   Dg Chest 2 View  Result Date: 08/22/2018 CLINICAL DATA:  Epigastric abdominal pain, elevated lipase. Probable pancreatitis. EXAM: CHEST - 2 VIEW COMPARISON:  None. FINDINGS: The lungs are reasonably well inflated. There is a  moderate-sized left pleural effusion. I can on exclude left basilar atelectasis or pneumonia. The interstitial markings of both lungs are coarse. The heart is top-normal in size. The pulmonary vascularity is not clearly engorged. The trachea is midline. The bony thorax exhibits no acute abnormality. IMPRESSION: Left lower lobe atelectasis or pneumonia with small left pleural effusion. Increased interstitial markings in both lungs may reflect acute or chronic bronchitic changes. No overt pulmonary edema. Electronically Signed   By: David  [place] M.D.   On: 08/22/2018 14:11     Medical Consultants:    None.  Anti-Infectives:   NOne  Subjective:    Robin Fischer she relates no new complaints, she relates she did not require narcotics overnight she would like a diet.  Objective:    Vitals:   08/23/18 1546 08/23/18 1556 08/23/18 2051 08/24/18 0556  BP: (!) 150/65  (!) 156/93 136/74  Pulse: 82  96 80  Resp: 16  20 17   Temp:   99.9 F (37.7 C) 98.5 F (36.9 C)  TempSrc:   Oral Oral  SpO2:  97% 99% 97%  Weight:      Height:        Intake/Output Summary (Last 24 hours) at 08/24/2018 0834 Last data filed at 08/24/2018 0700 Gross per 24 hour  Intake 2628.13 ml  Output -  Net 2628.13 ml   Filed Weights   08/21/18 1143 08/23/18 0800  Weight: 95.5 kg 95.5 kg    Exam: General exam: In no acute distress.  Respiratory system: Good air movement and clear to auscultation Cardiovascular system: S1-S2 with a regular rate and rhythm, no JVD Gastrointestinal system: Abdomen is significantly distended, soft no rebound tenderness but epigastric tenderness Central nervous system: Alert and oriented x3 nonfocal Extremities: Trace edema Skin: No rashes, lesions or ulcers Psychiatry: Judgement and insight appear normal. Mood & affect appropriate.    Data Reviewed:    Labs: Basic Metabolic Panel: Recent Labs  Lab 08/19/18 2256 08/20/18 0553 08/21/18 0701 08/22/18 0645  08/23/18 0610 08/24/18 0607  NA  --  141 141 138 138 139  K  --  3.0* 3.0* 3.2* 3.5 3.3*  CL  --  100 105 103 105 101  CO2  --  27 27 26 24  21*  GLUCOSE  --  129* 95 83 74 76  BUN  --  6 9 8  5* 5*  CREATININE  --  0.80 0.73 0.64 0.65 0.55  CALCIUM  --  8.6* 8.0* 8.2* 8.2* 8.5*  MG 1.9  --  2.2  --   --   --    GFR Estimated Creatinine Clearance: 101 mL/min (by C-G formula based on SCr of 0.55 mg/dL). Liver Function Tests: Recent Labs  Lab 08/19/18 2203 08/20/18 0553 08/21/18 0701 08/22/18 0645  AST 175* 104* 43* 45*  ALT 69* 58* 36 30  ALKPHOS 136* 119 87 93  BILITOT 1.2 0.9 1.3* 1.6*  PROT 7.2 6.4* 5.7* 6.2*  ALBUMIN 4.0 3.7 3.0* 3.0*   Recent Labs  Lab 08/19/18 2203 08/20/18 0553 08/21/18 0701 08/22/18 0645  LIPASE 3,782* 991* 263* 70*   No results for input(s): AMMONIA in the last 168 hours. Coagulation profile No results for input(s): INR, PROTIME in the last 168 hours.  CBC: Recent Labs  Lab 08/19/18 2203 08/21/18 0701 08/22/18 0645  WBC 13.3* 17.0* 16.1*  NEUTROABS 5.5 13.4* 11.0*  HGB 15.3* 14.4 14.4  HCT 44.3 42.5 42.2  MCV 81.1 84.2 82.4  PLT 283 180 217   Cardiac Enzymes: No results for input(s): CKTOTAL, CKMB, CKMBINDEX, TROPONINI in the last 168 hours. BNP (last 3 results) No results for input(s): PROBNP in the last 8760 hours. CBG: No results for input(s): GLUCAP in the last 168 hours. D-Dimer: No results for input(s): DDIMER in the last 72 hours. Hgb A1c: No results for input(s): HGBA1C in the last 72 hours. Lipid Profile: No results for input(s): CHOL, HDL, LDLCALC, TRIG, CHOLHDL, LDLDIRECT in the last 72 hours. Thyroid function studies: No results for input(s): TSH, T4TOTAL, T3FREE, THYROIDAB in the last 72 hours.  Invalid input(s): FREET3 Anemia work up: No results for input(s): VITAMINB12, FOLATE, FERRITIN, TIBC, IRON, RETICCTPCT in the last 72 hours. Sepsis Labs: Recent Labs  Lab 08/19/18 2203 08/21/18 0701 08/22/18 0645   WBC 13.3* 17.0* 16.1*   Microbiology No results found for this or any previous visit (from the past 240 hour(s)).   Medications:   . amLODipine  5 mg Oral Daily  . enoxaparin (LOVENOX) injection  40 mg Subcutaneous Daily  . folic acid  1 mg Oral Daily  . multivitamin with minerals  1 tablet Oral Daily  . nicotine  21 mg Transdermal Daily  . thiamine  100 mg Oral Daily   Or  . thiamine  100 mg Intravenous Daily   Continuous Infusions: . sodium chloride 10 mL/hr at 08/23/18 1300      LOS: 4 days   Marinda ElkAbraham Feliz Ortiz  [facility]   *Please refer to [email or web address], password [email or web address] to  get updated schedule on who will round on this patient, as hospitalists switch teams weekly. If 7PM-7AM, please contact night-coverage at [email or web address], password [email or web address] for any overnight needs.  08/24/2018, 8:34 AM

## 2018-08-25 LAB — BASIC METABOLIC PANEL
Anion gap: 11 (ref 5–15)
BUN: <5 mg/dL — ABNORMAL LOW (ref 6–20)
CO2: 22 mmol/L (ref 22–32)
CREATININE: 0.6 mg/dL (ref 0.44–1.00)
Calcium: 8.7 mg/dL — ABNORMAL LOW (ref 8.9–10.3)
Chloride: 110 mmol/L (ref 98–111)
GFR calc non Af Amer: >60 mL/min (ref >60)
Glucose, Bld: 104 mg/dL — ABNORMAL HIGH (ref 70–99)
Potassium: 3.5 mmol/L (ref 3.5–5.1)
Sodium: 143 mmol/L (ref 135–145)

## 2018-08-25 MED ORDER — CALCIUM CARBONATE ANTACID 500 MG PO CHEW
400.0000 mg | CHEWABLE_TABLET | Freq: Once | ORAL | Status: AC
  Administered 2018-08-25: 400 mg via ORAL
  Filled 2018-08-25: qty 2

## 2018-08-25 NOTE — Progress Notes (Signed)
Patient requesting food. She states she is tired of a clear liquid diet.  Patient denies nausea and denies pain. MD notified via text page.

## 2018-08-25 NOTE — Progress Notes (Signed)
[facility] PROGRESS NOTE    Progress Note  Robin Fischer  ZOX:[id number] DOB: 1971-04-28 DOA: 08/19/2018 PCP: Loletta SpecterGomez, Roger David, PA-C     Brief Narrative:   Robin Lawsvelyn Brothers is an 47 y.o. female past medical history significant for tobacco alcohol, essential hypertension who presents with abdominal pain, located in the epigastric area constant and sharp nonradiating lipase was done that was greater than 3000 on admission.  Likely due to pancreatitis  Assessment/Plan:   Alcoholic pancreatitis/elevated LFT's: LFT's are split to the wound likely due to alcohol abuse. She tolerated her liquid diet, will advance her to a soft diet today watcher this afternoon if stable within 24 hours can probably be discharged tomorrow morning.  She did relate some abdominal discomfort with a clear liquid diet but would like to try regular diet as she is significantly hungry.  Hypokalemia: Replete IV recheck in the morning.  Leukocytosis: Likely stress demargination. She has remained afebrile.  Essential hypertension: HCTZ was held on admission. Continue amlodipine and hydralazine as needed.  asthma Stable.   DVT prophylaxis: lovenox Family Communication:none Disposition Plan/Barrier to D/C: once acute pancreatitis resolved. Code Status:     Code Status Orders  (From admission, onward)         Start     Ordered   08/20/18 0022  Full code  Continuous     08/20/18 0022        Code Status History    This patient has a current code status but no historical code status.        IV Access:    Peripheral IV   Procedures and diagnostic studies:   No results found.   Medical Consultants:    None.  Anti-Infectives:   NOne  Subjective:    Robin Fischer she relates no new complaints, she relates she did not require narcotics overnight she would like a diet.  Objective:    Vitals:   08/24/18 1424 08/24/18 1438 08/24/18 1900 08/25/18 0459  BP: (!) 152/85  138/80 (!) 162/87 133/72  Pulse: 88  79 79  Resp: 20  20 16   Temp: 99 F (37.2 C)  99.6 F (37.6 C) 98.9 F (37.2 C)  TempSrc: Oral  Oral Oral  SpO2: 100%  98% 98%  Weight:      Height:        Intake/Output Summary (Last 24 hours) at 08/25/2018 0908 Last data filed at 08/24/2018 1700 Gross per 24 hour  Intake 753.42 ml  Output -  Net 753.42 ml   Filed Weights   08/21/18 1143 08/23/18 0800  Weight: 95.5 kg 95.5 kg    Exam: General exam: In no acute distress. Respiratory system: Good air movement and clear to auscultation Cardiovascular system: S1-S2 with a regular rate and rhythm, no JVD Gastrointestinal system: Diminished less than distended, soft no rebound tenderness but some mild epigastric tenderness. Central nervous system: Alert and oriented x3 nonfocal Extremities: Trace edema Skin: No rashes, lesions or ulcers Psychiatry: Judgement and insight appear normal. Mood & affect appropriate.    Data Reviewed:    Labs: Basic Metabolic Panel: Recent Labs  Lab 08/19/18 2256 08/20/18 0553 08/21/18 0701 08/22/18 0645 08/23/18 0610 08/24/18 0607  NA  --  141 141 138 138 139  K  --  3.0* 3.0* 3.2* 3.5 3.3*  CL  --  100 105 103 105 101  CO2  --  27 27 26 24  21*  GLUCOSE  --  129* 95 83 74 76  BUN  --  6 9 8  5* 5*  CREATININE  --  0.80 0.73 0.64 0.65 0.55  CALCIUM  --  8.6* 8.0* 8.2* 8.2* 8.5*  MG 1.9  --  2.2  --   --   --    GFR Estimated Creatinine Clearance: 101 mL/min (by C-G formula based on SCr of 0.55 mg/dL). Liver Function Tests: Recent Labs  Lab 08/19/18 2203 08/20/18 0553 08/21/18 0701 08/22/18 0645  AST 175* 104* 43* 45*  ALT 69* 58* 36 30  ALKPHOS 136* 119 87 93  BILITOT 1.2 0.9 1.3* 1.6*  PROT 7.2 6.4* 5.7* 6.2*  ALBUMIN 4.0 3.7 3.0* 3.0*   Recent Labs  Lab 08/19/18 2203 08/20/18 0553 08/21/18 0701 08/22/18 0645  LIPASE 3,782* 991* 263* 70*   No results for input(s): AMMONIA in the last 168 hours. Coagulation profile No  results for input(s): INR, PROTIME in the last 168 hours.  CBC: Recent Labs  Lab 08/19/18 2203 08/21/18 0701 08/22/18 0645  WBC 13.3* 17.0* 16.1*  NEUTROABS 5.5 13.4* 11.0*  HGB 15.3* 14.4 14.4  HCT 44.3 42.5 42.2  MCV 81.1 84.2 82.4  PLT 283 180 217   Cardiac Enzymes: No results for input(s): CKTOTAL, CKMB, CKMBINDEX, TROPONINI in the last 168 hours. BNP (last 3 results) No results for input(s): PROBNP in the last 8760 hours. CBG: No results for input(s): GLUCAP in the last 168 hours. D-Dimer: No results for input(s): DDIMER in the last 72 hours. Hgb A1c: No results for input(s): HGBA1C in the last 72 hours. Lipid Profile: No results for input(s): CHOL, HDL, LDLCALC, TRIG, CHOLHDL, LDLDIRECT in the last 72 hours. Thyroid function studies: No results for input(s): TSH, T4TOTAL, T3FREE, THYROIDAB in the last 72 hours.  Invalid input(s): FREET3 Anemia work up: No results for input(s): VITAMINB12, FOLATE, FERRITIN, TIBC, IRON, RETICCTPCT in the last 72 hours. Sepsis Labs: Recent Labs  Lab 08/19/18 2203 08/21/18 0701 08/22/18 0645  WBC 13.3* 17.0* 16.1*   Microbiology No results found for this or any previous visit (from the past 240 hour(s)).   Medications:   . amLODipine  5 mg Oral Daily  . enoxaparin (LOVENOX) injection  40 mg Subcutaneous Daily  . folic acid  1 mg Oral Daily  . multivitamin with minerals  1 tablet Oral Daily  . nicotine  21 mg Transdermal Daily  . thiamine  100 mg Oral Daily   Or  . thiamine  100 mg Intravenous Daily   Continuous Infusions: . sodium chloride Stopped (08/24/18 0840)      LOS: 5 days   Marinda ElkAbraham Feliz Ortiz  [facility]   *Please refer to [email or web address], password [email or web address] to get updated schedule on who will round on this patient, as hospitalists switch teams weekly. If 7PM-7AM, please contact night-coverage at [email or web address], password [email or web address] for any overnight needs.  08/25/2018, 9:08 AM

## 2018-08-26 MED ORDER — POTASSIUM CHLORIDE CRYS ER 20 MEQ PO TBCR
40.0000 meq | EXTENDED_RELEASE_TABLET | Freq: Two times a day (BID) | ORAL | Status: DC
  Administered 2018-08-26: 40 meq via ORAL
  Filled 2018-08-26: qty 2

## 2018-08-26 MED ORDER — AMLODIPINE BESYLATE 5 MG PO TABS: 5.0000 mg | ORAL_TABLET | Freq: Every day | ORAL | 0 refills | Status: DC

## 2018-08-26 NOTE — Discharge Summary (Signed)
Physician Discharge Summary  Robin Fischer [id number] DOB: 02/10/1971 DOA: 08/19/2018  PCP: Loletta SpecterGomez, Roger David, PA-C  Admit date: 08/19/2018 Discharge date: 08/26/2018  Admitted From: home Disposition:  Home  Recommendations for Outpatient Follow-up:  1. Follow up with PCP in 1-2 weeks 2. Please obtain BMP/CBC in one week   Home Health:no Equipment/Devices:none  Discharge Condition:stable CODE STATUS:full Diet recommendation: Heart Healthy  Brief/Interim Summary: 47 y.o. female past medical history significant for tobacco alcohol, essential hypertension who presents with abdominal pain, located in the epigastric area constant and sharp nonradiating lipase was done that was greater than 3000 on admission.  Likely due to pancreatitis  Discharge Diagnoses:  Principal Problem:   Alcoholic pancreatitis Active Problems:   Asthma   Hypertension   Hypokalemia   Tobacco abuse   Leukocytosis   Alcohol abuse   Abnormal LFTs   Acute pancreatitis Alcoholic pancreatitis/elevated LFT's: His LFTs were split 2-1 likely due to alcohol. She was placed on IV fluids n.p.o. and narcotics for pain took about 3 or 4 days to get over her acute episode.  Once he was able to tolerate orals she was changed to a regular diet which she tolerated. She was counseled about her alcohol abuse.  Hypokalemia: Repleted orally likely due to hypovolemia now resolved.  Leukocytosis: Likely stress demargination has remained stable.  Essential hypertension: She will continue her hydrochlorothiazide, we added oral Norvasc she will follow-up with PCP and titrate antihypertensive medications as needed.   Discharge Instructions  Discharge Instructions    Diet - low sodium heart healthy   Complete by:  As directed    Increase activity slowly   Complete by:  As directed      Allergies as of 08/26/2018   No Known Allergies     Medication List    TAKE these medications   amLODipine 5 MG  tablet Commonly known as:  NORVASC Take 1 tablet (5 mg total) by mouth daily.   hydrochlorothiazide 25 MG tablet Commonly known as:  HYDRODIURIL Take 1 tablet (25 mg total) by mouth daily.   omeprazole 40 MG capsule Commonly known as:  PRILOSEC Take 1 capsule (40 mg total) by mouth daily.   ondansetron 4 MG disintegrating tablet Commonly known as:  ZOFRAN ODT Take 1 tablet (4 mg total) by mouth every 8 (eight) hours as needed for nausea or vomiting.   ranitidine 150 MG tablet Commonly known as:  ZANTAC Take 1 tablet (150 mg total) by mouth 2 (two) times daily. What changed:    when to take this  reasons to take this       No Known Allergies  Consultations:  None   Procedures/Studies: Dg Chest 2 View  Result Date: 08/22/2018 CLINICAL DATA:  Epigastric abdominal pain, elevated lipase. Probable pancreatitis. EXAM: CHEST - 2 VIEW COMPARISON:  None. FINDINGS: The lungs are reasonably well inflated. There is a moderate-sized left pleural effusion. I can on exclude left basilar atelectasis or pneumonia. The interstitial markings of both lungs are coarse. The heart is top-normal in size. The pulmonary vascularity is not clearly engorged. The trachea is midline. The bony thorax exhibits no acute abnormality. IMPRESSION: Left lower lobe atelectasis or pneumonia with small left pleural effusion. Increased interstitial markings in both lungs may reflect acute or chronic bronchitic changes. No overt pulmonary edema. Electronically Signed   By: David  Swaziland[place] M.D.   On: 08/22/2018 14:11   Ct Abdomen Pelvis W Contrast  Result Date: 08/19/2018 CLINICAL DATA:  Pancreatitis suspected, atypical presentation/labs.  Abdominal pain. Nausea and vomiting. EXAM: CT ABDOMEN AND PELVIS WITH CONTRAST TECHNIQUE: Multidetector CT imaging of the abdomen and pelvis was performed using the standard protocol following bolus administration of intravenous contrast. CONTRAST:  100mL ISOVUE-300 IOPAMIDOL  (ISOVUE-300) INJECTION 61% COMPARISON:  CT 09/13/2017 FINDINGS: Lower chest: Lung bases are clear. No consolidation. Hepatobiliary: Enlarged liver with right lobe spanning 21.7 cm. Advanced hepatic steatosis. No discrete focal lesion. Mild gallbladder distention without calcified gallstone or pericholecystic inflammation. No biliary dilatation. Pancreas: Moderate diffuse peripancreatic fat stranding and free fluid about the entirety of the pancreas. No ductal dilatation. Homogeneous pancreatic enhancement without evidence of necrosis. No walled-off fluid collection. Splenic vein is patent. Spleen: Small posterior cleft. Normal in size. Splenic vein is patent. Adrenals/Urinary Tract: Normal adrenal glands. No hydronephrosis or perinephric edema. Homogeneous renal enhancement with symmetric excretion on delayed phase imaging. Incidental note of 2 right and 2 left renal arteries. Urinary bladder is near completely empty. Stomach/Bowel: Stomach is decompressed. Minimal fluid in the distal esophagus. No bowel wall thickening, inflammatory change, or obstruction. Mild diverticulosis of the descending colon without acute diverticulitis. Vascular/Lymphatic: Mild aortic atherosclerosis without aneurysm. Portal, splenic vein, and mesenteric veins are patent. No acute vascular findings. No enlarged lymph nodes in the abdomen or pelvis. Small peripancreatic nodes are likely reactive. Reproductive: Uterus and adnexa are unremarkable. Vaginal cyst is unchanged measuring 2.3 x 2.5 cm. Other: No free air. Peripancreatic fluid which slightly tracks in the mesentery. No walled-off fluid collection. Musculoskeletal: There are no acute or suspicious osseous abnormalities. IMPRESSION: 1. Acute edematous pancreatitis. No pancreatic necrosis or walled-off collection. 2. Hepatomegaly and hepatic steatosis. 3. Minimal diverticulosis without diverticulitis. 4. Stable cystic lesion in the vagina. 5.  Aortic Atherosclerosis (ICD10-I70.0).  Electronically Signed   By: Narda RutherfordMelanie  Sanford M.D.   On: 08/19/2018 23:39   [place] Abdomen Limited Ruq  Result Date: 08/20/2018 CLINICAL DATA:  Alcoholic pancreatitis. EXAM: ULTRASOUND ABDOMEN LIMITED RIGHT UPPER QUADRANT COMPARISON:  Abdominopelvic CT 2 hours prior. FINDINGS: Gallbladder: Physiologically distended. No gallstones or wall thickening visualized. No sonographic Murphy sign noted by sonographer. Common bile duct: Diameter: 2 mm. Liver: No focal lesion identified. Diffusely increased in parenchymal echogenicity. Portal vein is patent on color Doppler imaging with normal direction of blood flow towards the liver. IMPRESSION: 1. No gallstones. Normal sonographic appearance of gallbladder and biliary tree. 2. Hepatic steatosis. Electronically Signed   By: Narda RutherfordMelanie  Sanford M.D.   On: 08/20/2018 01:20     Subjective: No complains  Discharge Exam: Vitals:   08/25/18 2100 08/26/18 0500  BP: (!) 153/86 130/86  Pulse: 79 74  Resp: 20 18  Temp: 99.2 F (37.3 C) 98.7 F (37.1 C)  SpO2: 96% 98%   Vitals:   08/25/18 0459 08/25/18 1422 08/25/18 2100 08/26/18 0500  BP: 133/72 134/77 (!) 153/86 130/86  Pulse: 79 80 79 74  Resp: 16 18 20 18   Temp: 98.9 F (37.2 C) 98.9 F (37.2 C) 99.2 F (37.3 C) 98.7 F (37.1 C)  TempSrc: Oral Oral Oral Oral  SpO2: 98% 99% 96% 98%  Weight:      Height:        General: Pt is alert, awake, not in acute distress Cardiovascular: RRR, S1/S2 +, no rubs, no gallops Respiratory: CTA bilaterally, no wheezing, no rhonchi Abdominal: Soft, NT, ND, bowel sounds + Extremities: no edema, no cyanosis    The results of significant diagnostics from this hospitalization (including imaging, microbiology, ancillary and laboratory) are listed below for reference.  Microbiology: No results found for this or any previous visit (from the past 240 hour(s)).   Labs: BNP (last 3 results) No results for input(s): BNP in the last 8760 hours. Basic Metabolic  Panel: Recent Labs  Lab 08/19/18 2256  08/21/18 0701 08/22/18 0645 08/23/18 0610 08/24/18 0607 08/25/18 0612  NA  --    < > 141 138 138 139 143  K  --    < > 3.0* 3.2* 3.5 3.3* 3.5  CL  --    < > 105 103 105 101 110  CO2  --    < > 27 26 24  21* 22  GLUCOSE  --    < > 95 83 74 76 104*  BUN  --    < > 9 8 5* 5* <5*  CREATININE  --    < > 0.73 0.64 0.65 0.55 0.60  CALCIUM  --    < > 8.0* 8.2* 8.2* 8.5* 8.7*  MG 1.9  --  2.2  --   --   --   --    < > = values in this interval not displayed.   Liver Function Tests: Recent Labs  Lab 08/19/18 2203 08/20/18 0553 08/21/18 0701 08/22/18 0645  AST 175* 104* 43* 45*  ALT 69* 58* 36 30  ALKPHOS 136* 119 87 93  BILITOT 1.2 0.9 1.3* 1.6*  PROT 7.2 6.4* 5.7* 6.2*  ALBUMIN 4.0 3.7 3.0* 3.0*   Recent Labs  Lab 08/19/18 2203 08/20/18 0553 08/21/18 0701 08/22/18 0645  LIPASE 3,782* 991* 263* 70*   No results for input(s): AMMONIA in the last 168 hours. CBC: Recent Labs  Lab 08/19/18 2203 08/21/18 0701 08/22/18 0645  WBC 13.3* 17.0* 16.1*  NEUTROABS 5.5 13.4* 11.0*  HGB 15.3* 14.4 14.4  HCT 44.3 42.5 42.2  MCV 81.1 84.2 82.4  PLT 283 180 217   Cardiac Enzymes: No results for input(s): CKTOTAL, CKMB, CKMBINDEX, TROPONINI in the last 168 hours. BNP: Invalid input(s): POCBNP CBG: No results for input(s): GLUCAP in the last 168 hours. D-Dimer No results for input(s): DDIMER in the last 72 hours. Hgb A1c No results for input(s): HGBA1C in the last 72 hours. Lipid Profile No results for input(s): CHOL, HDL, LDLCALC, TRIG, CHOLHDL, LDLDIRECT in the last 72 hours. Thyroid function studies No results for input(s): TSH, T4TOTAL, T3FREE, THYROIDAB in the last 72 hours.  Invalid input(s): FREET3 Anemia work up No results for input(s): VITAMINB12, FOLATE, FERRITIN, TIBC, IRON, RETICCTPCT in the last 72 hours. Urinalysis    Component Value Date/Time   COLORURINE YELLOW 05/06/2018 1054   APPEARANCEUR CLEAR 05/06/2018 1054    LABSPEC 1.013 05/06/2018 1054   PHURINE 6.0 05/06/2018 1054   GLUCOSEU NEGATIVE 05/06/2018 1054   HGBUR NEGATIVE 05/06/2018 1054   BILIRUBINUR NEGATIVE 05/06/2018 1054   BILIRUBINUR negative 01/18/2017 1658   KETONESUR NEGATIVE 05/06/2018 1054   PROTEINUR NEGATIVE 05/06/2018 1054   UROBILINOGEN 0.2 01/18/2017 1658   NITRITE NEGATIVE 05/06/2018 1054   LEUKOCYTESUR NEGATIVE 05/06/2018 1054   Sepsis Labs Invalid input(s): PROCALCITONIN,  WBC,  LACTICIDVEN Microbiology No results found for this or any previous visit (from the past 240 hour(s)).   Time coordinating discharge: 40 minutes  SIGNED:   Marinda Elk, MD  [facility] 08/26/2018, 9:04 AM Pager   If 7PM-7AM, please contact night-coverage [email or web address] Password [email or web address]

## 2018-08-26 NOTE — Progress Notes (Signed)
Discharge instructions and medications discussed with patient.  AVS and prescription given to patient.  All questions answered.  

## 2018-08-30 ENCOUNTER — Other Ambulatory Visit (INDEPENDENT_AMBULATORY_CARE_PROVIDER_SITE_OTHER): Payer: Self-pay | Admitting: Physician Assistant

## 2018-08-30 NOTE — Telephone Encounter (Signed)
FWD to PCP. Tempestt S Roberts, CMA  

## 2018-11-20 ENCOUNTER — Telehealth (INDEPENDENT_AMBULATORY_CARE_PROVIDER_SITE_OTHER): Payer: Self-pay | Admitting: Physician Assistant

## 2018-11-20 MED ORDER — AMLODIPINE BESYLATE 5 MG PO TABS: 5.0000 mg | ORAL_TABLET | Freq: Every day | ORAL | 0 refills | Status: DC

## 2018-11-20 NOTE — Telephone Encounter (Signed)
Pt must have an appointment to f/u for HTN. Will refill x 1 only. Then will need an OV.

## 2018-11-20 NOTE — Telephone Encounter (Signed)
Patient called to request med refill for amLODipine (NORVASC) 5 MG tablet   Patient would like for Rx to be sent to [facility]  Please advice [phone]  Thank you Louisa Second

## 2018-11-27 ENCOUNTER — Ambulatory Visit (INDEPENDENT_AMBULATORY_CARE_PROVIDER_SITE_OTHER): Payer: Medicaid Other | Admitting: Primary Care

## 2018-12-21 ENCOUNTER — Other Ambulatory Visit (INDEPENDENT_AMBULATORY_CARE_PROVIDER_SITE_OTHER): Payer: Self-pay | Admitting: Primary Care

## 2019-02-28 ENCOUNTER — Other Ambulatory Visit (INDEPENDENT_AMBULATORY_CARE_PROVIDER_SITE_OTHER): Payer: Self-pay | Admitting: Primary Care

## 2019-03-22 ENCOUNTER — Ambulatory Visit (INDEPENDENT_AMBULATORY_CARE_PROVIDER_SITE_OTHER): Payer: Medicaid Other | Admitting: Primary Care

## 2019-03-22 ENCOUNTER — Other Ambulatory Visit: Payer: Self-pay

## 2019-03-22 ENCOUNTER — Encounter (INDEPENDENT_AMBULATORY_CARE_PROVIDER_SITE_OTHER): Payer: Self-pay | Admitting: Primary Care

## 2019-03-22 VITALS — BP 150/88 | HR 68 | Temp 98.8°F | Ht 67.0 in | Wt 211.0 lb

## 2019-03-22 DIAGNOSIS — Z1211 Encounter for screening for malignant neoplasm of colon: Secondary | ICD-10-CM | POA: Diagnosis not present

## 2019-03-22 DIAGNOSIS — Z23 Encounter for immunization: Secondary | ICD-10-CM | POA: Diagnosis not present

## 2019-03-22 DIAGNOSIS — J0110 Acute frontal sinusitis, unspecified: Secondary | ICD-10-CM

## 2019-03-22 DIAGNOSIS — F17200 Nicotine dependence, unspecified, uncomplicated: Secondary | ICD-10-CM

## 2019-03-22 DIAGNOSIS — F4323 Adjustment disorder with mixed anxiety and depressed mood: Secondary | ICD-10-CM | POA: Diagnosis not present

## 2019-03-22 MED ORDER — AMLODIPINE BESYLATE 10 MG PO TABS: 10.0000 mg | ORAL_TABLET | Freq: Every day | ORAL | 0 refills | Status: DC

## 2019-03-22 MED ORDER — CEPHALEXIN 500 MG PO CAPS: 500.0000 mg | ORAL_CAPSULE | Freq: Three times a day (TID) | ORAL | 0 refills | Status: DC

## 2019-03-22 MED ORDER — FLUTICASONE PROPIONATE 50 MCG/ACT NA SUSP: 2.0000 | Freq: Every day | NASAL | 0 refills | Status: DC

## 2019-03-22 MED ORDER — TRAZODONE HCL 50 MG PO TABS: 50.0000 mg | ORAL_TABLET | Freq: Every day | ORAL | 3 refills | Status: DC

## 2019-03-22 MED ORDER — FLUCONAZOLE 150 MG PO TABS: 150.0000 mg | ORAL_TABLET | Freq: Once | ORAL | 0 refills | Status: AC

## 2019-03-22 MED ORDER — DM-GUAIFENESIN ER 30-600 MG PO TB12: 1.0000 | ORAL_TABLET | Freq: Two times a day (BID) | ORAL | 0 refills | Status: DC

## 2019-03-22 NOTE — Patient Instructions (Signed)
Sinus Headache  A sinus headache happens when your sinuses get swollen or blocked (clogged). Sinuses are spaces behind the bones of your face and forehead. You may feel pain or pressure in your face, forehead, ears, or upper teeth. Sinus headaches can be mild or very bad. Follow these instructions at home: General instructions  If told: ? Apply a warm, moist washcloth to your face. This can help to lessen pain. ? Use a nasal saline wash. Follow the directions on the bottle or box. Medicines   Take over-the-counter and prescription medicines only as told by your doctor.  If you were prescribed an antibiotic medicine, take it as told by your doctor. Do not stop taking it even if you start to feel better.  Use a nose spray if your nose feels full of mucus (congested). Hydrate and humidify  Drink enough water to keep your pee (urine) pale yellow.  Use a cool mist humidifier to keep the humidity level in your home above 50%.  Breathe in steam for 10-15 minutes, 3-4 times a day or as told by your doctor. You can do this in the bathroom while a hot shower is running.  Try not to spend time in cool or dry air. Contact a doctor if:  You get more than one headache a week.  Light or sound bothers you.  You have a fever.  You feel sick to your stomach (nauseous) or you throw up (vomit).  Your headaches do not get better with treatment. Get help right away if:  You have trouble seeing.  You suddenly have very bad pain in your face or head.  You start to have quick, sudden movements or shaking that you cannot control (seizure).  You are confused.  You have a stiff neck. Summary  A sinus headache happens when your sinuses get swollen or blocked (clogged). Sinuses are spaces behind the bones of your face and forehead.  You may feel pain or pressure in your face, forehead, ears, or upper teeth.  Take over-the-counter and prescription medicines only as told by your doctor.  If  told, apply a warm, moist washcloth to your face. This can help to lessen pain. This information is not intended to replace advice given to you by your health care provider. Make sure you discuss any questions you have with your health care provider. Document Released: 12/22/2010 Document Revised: 08/04/2017 Document Reviewed: 06/02/2017 Elsevier Patient Education  2020 [employer]  

## 2019-03-22 NOTE — Progress Notes (Signed)
Acute Office Visit  Subjective:    Patient ID: Robin Fischer, female    DOB: 02-May-1971, 48 y.o.   MRN: [id number]  Chief Complaint  Patient presents with  . Establish Care    HTN  . Medication Refill    HPI Patient is in today for a acute visit she has concerns with a lot of phlegm when every she wakes up even if she takes a nap.She feels nauseated and congested.   Past Medical History:  Diagnosis Date  . Asthma   . Bronchitis   . High cholesterol   . Hypertension   . Tobacco abuse     History reviewed. No pertinent surgical history.  Family History  Problem Relation Age of Onset  . Hypertension Mother   . Hyperlipidemia Mother   . Hypertension Father   . Hyperlipidemia Father     Social History   Socioeconomic History  . Marital status: Single    Spouse name: Not on file  . Number of children: Not on file  . Years of education: Not on file  . Highest education level: Not on file  Occupational History  . Not on file  Social Needs  . Financial resource strain: Not on file  . Food insecurity    Worry: Not on file    Inability: Not on file  . Transportation needs    Medical: Not on file    Non-medical: Not on file  Tobacco Use  . Smoking status: Current Every Day Smoker  . Smokeless tobacco: Never Used  Substance and Sexual Activity  . Alcohol use: Yes    Comment: beer  . Drug use: No  . Sexual activity: Not on file  Lifestyle  . Physical activity    Days per week: Not on file    Minutes per session: Not on file  . Stress: Not on file  Relationships  . Social Herbalist on phone: Not on file    Gets together: Not on file    Attends religious service: Not on file    Active member of club or organization: Not on file    Attends meetings of clubs or organizations: Not on file    Relationship status: Not on file  . Intimate partner violence    Fear of current or ex partner: Not on file    Emotionally abused: Not on file    Physically  abused: Not on file    Forced sexual activity: Not on file  Other Topics Concern  . Not on file  Social History Narrative  . Not on file    Outpatient Medications Prior to Visit  Medication Sig Dispense Refill  . hydrochlorothiazide (HYDRODIURIL) 25 MG tablet Take 1 tablet (25 mg total) by mouth daily. (Patient not taking: Reported on 03/22/2019) 90 tablet 1  . amLODipine (NORVASC) 5 MG tablet Take 1 tablet (5 mg total) by mouth daily. (Patient not taking: Reported on 03/22/2019) 30 tablet 0  . omeprazole (PRILOSEC) 40 MG capsule Take 1 capsule (40 mg total) by mouth daily. 30 capsule 3  . ondansetron (ZOFRAN ODT) 4 MG disintegrating tablet Take 1 tablet (4 mg total) by mouth every 8 (eight) hours as needed for nausea or vomiting. (Patient not taking: Reported on 08/20/2018) 10 tablet 0  . ranitidine (ZANTAC) 150 MG tablet Take 1 tablet (150 mg total) by mouth 2 (two) times daily. (Patient taking differently: Take 150 mg by mouth 2 (two) times daily as needed for heartburn. )  60 tablet 0   No facility-administered medications prior to visit.     No Known Allergies  Review of Systems  HENT: Positive for congestion and sinus pain.   Eyes: Positive for blurred vision.  Gastrointestinal: Positive for diarrhea and vomiting.  Musculoskeletal: Negative.   Neurological: Positive for headaches.       Frontal  Psychiatric/Behavioral: Positive for depression. The patient has insomnia.        Objective:    Physical Exam  Constitutional: She is oriented to person, place, and time. She appears well-developed and well-nourished.  HENT:  Head: Normocephalic and atraumatic.  Eyes: EOM are normal.  Neck: Normal range of motion. Neck supple.  Cardiovascular: Normal rate and regular rhythm.  Pulmonary/Chest: Effort normal and breath sounds normal.  Abdominal: Soft. Bowel sounds are normal. She exhibits distension.  Musculoskeletal: Normal range of motion.  Neurological: She is alert and  oriented to person, place, and time.  Skin: Skin is warm.  Psychiatric: She has a normal mood and affect.    BP (!) 150/88 (BP Location: Left Arm, Patient Position: Sitting, Cuff Size: Normal)   Pulse 68   Temp 98.8 F (37.1 C) (Tympanic)   Ht 5\' 7"  (1.702 m)   Wt 211 lb (95.7 kg)   SpO2 92%   BMI 33.05 kg/m  Wt Readings from Last 3 Encounters:  03/22/19 211 lb (95.7 kg)  08/23/18 210 lb 8.6 oz (95.5 kg)  02/22/18 190 lb 6.4 oz (86.4 kg)    Health Maintenance Due  Topic Date Due  . PAP SMEAR-Modifier  03/02/1990  . MAMMOGRAM  03/03/2019  . COLONOSCOPY  03/03/2019    There are no preventive care reminders to display for this patient.   No results found for: TSH Lab Results  Component Value Date   WBC 16.1 (H) 08/22/2018   HGB 14.4 08/22/2018   HCT 42.2 08/22/2018   MCV 82.4 08/22/2018   PLT 217 08/22/2018   Lab Results  Component Value Date   NA 143 08/25/2018   K 3.5 08/25/2018   CO2 22 08/25/2018   GLUCOSE 104 (H) 08/25/2018   BUN <5 (L) 08/25/2018   CREATININE 0.60 08/25/2018   BILITOT 1.6 (H) 08/22/2018   ALKPHOS 93 08/22/2018   AST 45 (H) 08/22/2018   ALT 30 08/22/2018   PROT 6.2 (L) 08/22/2018   ALBUMIN 3.0 (L) 08/22/2018   CALCIUM 8.7 (L) 08/25/2018   ANIONGAP 11 08/25/2018   Lab Results  Component Value Date   CHOL 337 (H) 02/22/2018   Lab Results  Component Value Date   HDL 65 02/22/2018   Lab Results  Component Value Date   LDLCALC 207 (H) 02/22/2018   Lab Results  Component Value Date   TRIG 343 (H) 08/20/2018   Lab Results  Component Value Date   CHOLHDL 5.2 (H) 02/22/2018   No results found for: HGBA1C     Assessment & Plan:   Renea Eevelyn was seen today for establish care and medication refill.  Diagnoses and all orders for this visit:  Need for Tdap vaccination -     Tdap vaccine greater than or equal to 7yo IM  Special screening for malignant neoplasms, colon Normal colon cancer screening.  CDC recommends colorectal  screening from ages 8550-75. Screening can begin at 50 or earlier in some cases. This screening is used for a disease when no symptoms are present . Diagnostic test is used for symptoms examples blood in stool, colorectal polyps or coloector cancer, family  history or inflammatory bowel disease - chron's or ulcerative colitis .(USPSTF) -     Fecal occult blood, imunochemical([facility]); Future  Acute non-recurrent frontal sinusitis Infections are common during spring and fall due to  increased pollination. Antihistamines can be used daily with intrnasal steroid spray limited to 14 days to avoid rebound effects. May use normal saline nasal spray daily.    cephALEXin (KEFLEX) 500 MG capsule; Take 1 capsule (500 mg total) by mouth 3 (three) times daily. -     fluconazole (DIFLUCAN) 150 MG tablet; Take 1 tablet (150 mg total) by mouth once for 1 dose.   Adjustment disorder with mixed anxiety and depressed mood Depression can affect thoughts, feelings & sleep. Can lead to changes in mood ,relationships, daily activities & physical health, caused by interference in brain chemicals.   traZODone (DESYREL) 50 MG tablet; Take 1 tablet (50 mg total) by mouth at bedtime.  Other orders      amLODipine (NORVASC) 10 MG tablet; Take 1 tablet (10 mg total) by mouth daily.     Meds ordered this encounter  Medications  . traZODone (DESYREL) 50 MG tablet    Sig: Take 1 tablet (50 mg total) by mouth at bedtime.    Dispense:  30 tablet    Refill:  3  . cephALEXin (KEFLEX) 500 MG capsule    Sig: Take 1 capsule (500 mg total) by mouth 3 (three) times daily.    Dispense:  15 capsule    Refill:  0  . fluconazole (DIFLUCAN) 150 MG tablet    Sig: Take 1 tablet (150 mg total) by mouth once for 1 dose.    Dispense:  2 tablet    Refill:  0  . dextromethorphan-guaiFENesin (MUCINEX DM) 30-600 MG 12hr tablet    Sig: Take 1 tablet by mouth 2 (two) times daily.    Dispense:  7 tablet    Refill:  0  . fluticasone  (FLONASE) 50 MCG/ACT nasal spray    Sig: Place 2 sprays into both nostrils daily.    Dispense:  16 g    Refill:  0  . amLODipine (NORVASC) 10 MG tablet    Sig: Take 1 tablet (10 mg total) by mouth daily.    Dispense:  90 tablet    Refill:  0     Grayce SessionsMichelle P , NP

## 2019-03-22 NOTE — Progress Notes (Signed)
Pt complains of sinus pressure; she would like a Rx for sinus. She also complains of excessive mucus. States she vomits mucus and even has mucus in her stool.

## 2019-04-23 ENCOUNTER — Other Ambulatory Visit (INDEPENDENT_AMBULATORY_CARE_PROVIDER_SITE_OTHER): Payer: Self-pay | Admitting: Primary Care

## 2019-05-04 ENCOUNTER — Encounter (HOSPITAL_COMMUNITY): Payer: Self-pay | Admitting: *Deleted

## 2019-05-04 ENCOUNTER — Other Ambulatory Visit: Payer: Self-pay

## 2019-05-04 ENCOUNTER — Emergency Department (HOSPITAL_COMMUNITY): Payer: Medicaid Other

## 2019-05-04 ENCOUNTER — Inpatient Hospital Stay (HOSPITAL_COMMUNITY)
Admission: EM | Admit: 2019-05-04 | Discharge: 2019-05-08 | DRG: 440 | Disposition: A | Discharge status: Home / Self Care | Payer: Medicaid Other | Attending: Internal Medicine | Admitting: Internal Medicine

## 2019-05-04 DIAGNOSIS — Z20828 Contact with and (suspected) exposure to other viral communicable diseases: Secondary | ICD-10-CM | POA: Diagnosis present

## 2019-05-04 DIAGNOSIS — J45909 Unspecified asthma, uncomplicated: Secondary | ICD-10-CM | POA: Diagnosis present

## 2019-05-04 DIAGNOSIS — Z79899 Other long term (current) drug therapy: Secondary | ICD-10-CM | POA: Diagnosis not present

## 2019-05-04 DIAGNOSIS — K859 Acute pancreatitis without necrosis or infection, unspecified: Secondary | ICD-10-CM | POA: Diagnosis present

## 2019-05-04 DIAGNOSIS — E860 Dehydration: Secondary | ICD-10-CM

## 2019-05-04 DIAGNOSIS — E876 Hypokalemia: Secondary | ICD-10-CM

## 2019-05-04 DIAGNOSIS — E669 Obesity, unspecified: Secondary | ICD-10-CM | POA: Diagnosis present

## 2019-05-04 DIAGNOSIS — K852 Alcohol induced acute pancreatitis without necrosis or infection: Principal | ICD-10-CM

## 2019-05-04 DIAGNOSIS — D72829 Elevated white blood cell count, unspecified: Secondary | ICD-10-CM

## 2019-05-04 DIAGNOSIS — K760 Fatty (change of) liver, not elsewhere classified: Secondary | ICD-10-CM | POA: Diagnosis present

## 2019-05-04 DIAGNOSIS — E7800 Pure hypercholesterolemia, unspecified: Secondary | ICD-10-CM | POA: Diagnosis present

## 2019-05-04 DIAGNOSIS — E785 Hyperlipidemia, unspecified: Secondary | ICD-10-CM | POA: Diagnosis present

## 2019-05-04 DIAGNOSIS — Z716 Tobacco abuse counseling: Secondary | ICD-10-CM | POA: Diagnosis not present

## 2019-05-04 DIAGNOSIS — J4520 Mild intermittent asthma, uncomplicated: Secondary | ICD-10-CM | POA: Diagnosis not present

## 2019-05-04 DIAGNOSIS — I10 Essential (primary) hypertension: Secondary | ICD-10-CM | POA: Diagnosis present

## 2019-05-04 DIAGNOSIS — Z6833 Body mass index (BMI) 33.0-33.9, adult: Secondary | ICD-10-CM

## 2019-05-04 DIAGNOSIS — F17210 Nicotine dependence, cigarettes, uncomplicated: Secondary | ICD-10-CM

## 2019-05-04 DIAGNOSIS — F1010 Alcohol abuse, uncomplicated: Secondary | ICD-10-CM | POA: Diagnosis present

## 2019-05-04 DIAGNOSIS — Z8249 Family history of ischemic heart disease and other diseases of the circulatory system: Secondary | ICD-10-CM

## 2019-05-04 DIAGNOSIS — K219 Gastro-esophageal reflux disease without esophagitis: Secondary | ICD-10-CM | POA: Diagnosis present

## 2019-05-04 DIAGNOSIS — Z720 Tobacco use: Secondary | ICD-10-CM | POA: Diagnosis not present

## 2019-05-04 DIAGNOSIS — Z8349 Family history of other endocrine, nutritional and metabolic diseases: Secondary | ICD-10-CM

## 2019-05-04 DIAGNOSIS — R112 Nausea with vomiting, unspecified: Secondary | ICD-10-CM

## 2019-05-04 HISTORY — DX: Acute pancreatitis without necrosis or infection, unspecified: K85.90

## 2019-05-04 HISTORY — DX: Gastro-esophageal reflux disease without esophagitis: K21.9

## 2019-05-04 LAB — CBC
HCT: 47.5 % — ABNORMAL HIGH (ref 36.0–46.0)
Hemoglobin: 16.7 g/dL — ABNORMAL HIGH (ref 12.0–15.0)
MCH: 28.5 pg (ref 26.0–34.0)
MCHC: 35.2 g/dL (ref 30.0–36.0)
MCV: 81.1 fL (ref 80.0–100.0)
Platelets: 348 10*3/uL (ref 150–400)
RBC: 5.86 MIL/uL — ABNORMAL HIGH (ref 3.87–5.11)
RDW: 14.5 % (ref 11.5–15.5)
WBC: 15.4 10*3/uL — ABNORMAL HIGH (ref 4.0–10.5)
nRBC: 0 % (ref 0.0–0.2)

## 2019-05-04 LAB — RAPID URINE DRUG SCREEN, HOSP PERFORMED
Amphetamines: NOT DETECTED
Barbiturates: NOT DETECTED
Benzodiazepines: NOT DETECTED
Cocaine: NOT DETECTED
Opiates: NOT DETECTED
Tetrahydrocannabinol: NOT DETECTED

## 2019-05-04 LAB — HCG, QUANTITATIVE, PREGNANCY: hCG, Beta Chain, Quant, S: 3 m[IU]/mL (ref <5)

## 2019-05-04 LAB — URINALYSIS, ROUTINE W REFLEX MICROSCOPIC
Bacteria, UA: NONE SEEN
Glucose, UA: NEGATIVE mg/dL
Hgb urine dipstick: NEGATIVE
Ketones, ur: NEGATIVE mg/dL
Nitrite: NEGATIVE
Protein, ur: 100 mg/dL — AB
Specific Gravity, Urine: 1.021 (ref 1.005–1.030)
pH: 8 (ref 5.0–8.0)

## 2019-05-04 LAB — COMPREHENSIVE METABOLIC PANEL
ALT: 32 U/L (ref 0–44)
AST: 71 U/L — ABNORMAL HIGH (ref 15–41)
Albumin: 4.2 g/dL (ref 3.5–5.0)
Alkaline Phosphatase: 131 U/L — ABNORMAL HIGH (ref 38–126)
Anion gap: 16 — ABNORMAL HIGH (ref 5–15)
BUN: 6 mg/dL (ref 6–20)
CO2: 25 mmol/L (ref 22–32)
Calcium: 9.7 mg/dL (ref 8.9–10.3)
Chloride: 100 mmol/L (ref 98–111)
Creatinine, Ser: 0.7 mg/dL (ref 0.44–1.00)
GFR calc Af Amer: >60 mL/min (ref >60)
GFR calc non Af Amer: >60 mL/min (ref >60)
Glucose, Bld: 112 mg/dL — ABNORMAL HIGH (ref 70–99)
Potassium: 3 mmol/L — ABNORMAL LOW (ref 3.5–5.1)
Sodium: 141 mmol/L (ref 135–145)
Total Bilirubin: 0.9 mg/dL (ref 0.3–1.2)
Total Protein: 7.7 g/dL (ref 6.5–8.1)

## 2019-05-04 LAB — LIPASE, BLOOD: Lipase: 935 U/L — ABNORMAL HIGH (ref 11–51)

## 2019-05-04 MED ORDER — NICOTINE 21 MG/24HR TD PT24
21.0000 mg | MEDICATED_PATCH | Freq: Every day | TRANSDERMAL | Status: DC
  Administered 2019-05-04 – 2019-05-07 (×4): 21 mg via TRANSDERMAL
  Filled 2019-05-04 (×4): qty 1

## 2019-05-04 MED ORDER — HYDROMORPHONE HCL 1 MG/ML IJ SOLN
0.5000 mg | INTRAMUSCULAR | Status: DC | PRN
  Administered 2019-05-05 (×4): 0.5 mg via INTRAVENOUS
  Filled 2019-05-04 (×4): qty 0.5

## 2019-05-04 MED ORDER — HYDROCODONE-ACETAMINOPHEN 5-325 MG PO TABS
1.0000 | ORAL_TABLET | ORAL | Status: DC | PRN
  Administered 2019-05-06 (×2): 2 via ORAL
  Administered 2019-05-07 (×2): 1 via ORAL
  Administered 2019-05-08: 2 via ORAL
  Filled 2019-05-04: qty 1
  Filled 2019-05-04 (×2): qty 2
  Filled 2019-05-04 (×3): qty 1

## 2019-05-04 MED ORDER — ONDANSETRON HCL 4 MG PO TABS: 4.0000 mg | ORAL_TABLET | Freq: Four times a day (QID) | ORAL | Status: DC | PRN

## 2019-05-04 MED ORDER — LORAZEPAM 1 MG PO TABS
1.0000 mg | ORAL_TABLET | Freq: Four times a day (QID) | ORAL | Status: AC | PRN
  Administered 2019-05-06: 21:00:00 1 mg via ORAL
  Filled 2019-05-04 (×2): qty 1

## 2019-05-04 MED ORDER — POTASSIUM CHLORIDE CRYS ER 20 MEQ PO TBCR
40.0000 meq | EXTENDED_RELEASE_TABLET | Freq: Once | ORAL | Status: DC
  Filled 2019-05-04: qty 2

## 2019-05-04 MED ORDER — AMLODIPINE BESYLATE 10 MG PO TABS
10.0000 mg | ORAL_TABLET | Freq: Every day | ORAL | Status: DC
  Administered 2019-05-05 – 2019-05-07 (×3): 10 mg via ORAL
  Filled 2019-05-04 (×3): qty 1

## 2019-05-04 MED ORDER — SODIUM CHLORIDE 0.9% FLUSH
3.0000 mL | Freq: Once | INTRAVENOUS | Status: DC
  Administered 2019-05-04: 17:00:00 3 mL via INTRAVENOUS

## 2019-05-04 MED ORDER — LORAZEPAM 2 MG/ML IJ SOLN
1.0000 mg | Freq: Four times a day (QID) | INTRAMUSCULAR | Status: AC | PRN
  Administered 2019-05-05: 1 mg via INTRAVENOUS
  Filled 2019-05-04: qty 1

## 2019-05-04 MED ORDER — ADULT MULTIVITAMIN W/MINERALS CH
1.0000 | ORAL_TABLET | Freq: Every day | ORAL | Status: DC
  Filled 2019-05-04: qty 1

## 2019-05-04 MED ORDER — MORPHINE SULFATE (PF) 4 MG/ML IV SOLN
8.0000 mg | INTRAVENOUS | Status: DC | PRN
  Administered 2019-05-04 – 2019-05-05 (×3): 8 mg via INTRAVENOUS
  Filled 2019-05-04 (×3): qty 2

## 2019-05-04 MED ORDER — SODIUM CHLORIDE (PF) 0.9 % IJ SOLN
INTRAMUSCULAR | Status: AC
  Administered 2019-05-04: 17:00:00 3 mL via INTRAVENOUS
  Filled 2019-05-04: qty 50

## 2019-05-04 MED ORDER — ONDANSETRON HCL 4 MG/2ML IJ SOLN
4.0000 mg | Freq: Once | INTRAMUSCULAR | Status: AC
  Administered 2019-05-04: 17:00:00 4 mg via INTRAVENOUS
  Filled 2019-05-04: qty 2

## 2019-05-04 MED ORDER — ACETAMINOPHEN 325 MG PO TABS: 650.0000 mg | ORAL_TABLET | Freq: Four times a day (QID) | ORAL | Status: DC | PRN

## 2019-05-04 MED ORDER — THIAMINE HCL 100 MG/ML IJ SOLN
100.0000 mg | Freq: Every day | INTRAMUSCULAR | Status: DC
  Administered 2019-05-05 – 2019-05-06 (×2): 100 mg via INTRAVENOUS
  Filled 2019-05-04: qty 2

## 2019-05-04 MED ORDER — IOHEXOL 300 MG/ML  SOLN
100.0000 mL | Freq: Once | INTRAMUSCULAR | Status: AC | PRN
  Administered 2019-05-04: 17:00:00 100 mL via INTRAVENOUS

## 2019-05-04 MED ORDER — FOLIC ACID 1 MG PO TABS
1.0000 mg | ORAL_TABLET | Freq: Every day | ORAL | Status: DC
  Filled 2019-05-04: qty 1

## 2019-05-04 MED ORDER — SODIUM CHLORIDE 0.9 % IV SOLN
INTRAVENOUS | Status: DC
  Administered 2019-05-04 – 2019-05-05 (×4): via INTRAVENOUS

## 2019-05-04 MED ORDER — POTASSIUM CHLORIDE 10 MEQ/100ML IV SOLN
10.0000 meq | INTRAVENOUS | Status: AC
  Administered 2019-05-04 – 2019-05-05 (×4): 10 meq via INTRAVENOUS
  Filled 2019-05-04 (×5): qty 100

## 2019-05-04 MED ORDER — MORPHINE SULFATE (PF) 4 MG/ML IV SOLN
4.0000 mg | Freq: Once | INTRAVENOUS | Status: AC
  Administered 2019-05-04: 17:00:00 4 mg via INTRAVENOUS
  Filled 2019-05-04: qty 1

## 2019-05-04 MED ORDER — ACETAMINOPHEN 650 MG RE SUPP: 650.0000 mg | Freq: Four times a day (QID) | RECTAL | Status: DC | PRN

## 2019-05-04 MED ORDER — VITAMIN B-1 100 MG PO TABS
100.0000 mg | ORAL_TABLET | Freq: Every day | ORAL | Status: DC
  Administered 2019-05-07: 100 mg via ORAL
  Filled 2019-05-04 (×3): qty 1

## 2019-05-04 MED ORDER — ONDANSETRON HCL 4 MG/2ML IJ SOLN
4.0000 mg | Freq: Four times a day (QID) | INTRAMUSCULAR | Status: DC | PRN
  Administered 2019-05-04 – 2019-05-05 (×2): 4 mg via INTRAVENOUS
  Filled 2019-05-04 (×2): qty 2

## 2019-05-04 NOTE — ED Provider Notes (Signed)
[facility]-EMERGENCY DEPT Provider Note   CSN: [id number] Arrival date & time: 05/04/19  1518     History   Chief Complaint Chief Complaint  Patient presents with  . Abdominal Pain  . Emesis  . Nausea    HPI Robin Fischer is a 48 y.o. female.     HPI Patient presents with abdominal pain, nausea, vomiting. Onset was today, soon after awakening, approximately 9 hours ago. Since onset pain is been persistent, is diffuse, sore, severe, not improved with anything, though it is unclear if she is taking any medication for pain relief. Patient states that she is generally well, denies history of abdominal surgery. She does have history of hypertension, and per report pancreatitis. No fever, no dyspnea, no chest pain.  Past Medical History:  Diagnosis Date  . Asthma   . Bronchitis   . GERD (gastroesophageal reflux disease)   . High cholesterol   . Hypertension   . Pancreatitis   . Tobacco abuse     Patient Active Problem List   Diagnosis Date Noted  . Hypokalemia 08/20/2018  . Tobacco abuse 08/20/2018  . Leukocytosis 08/20/2018  . Alcohol abuse 08/20/2018  . 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 Abnormal LFTs 08/20/2018  . Acute pancreatitis 08/20/2018  . Asthma   . Hypertension     History reviewed. No pertinent surgical history.   OB History   No obstetric history on file.      Home Medications    Prior to Admission medications   Medication Sig Start Date End Date Taking? Authorizing Provider  amLODipine (NORVASC) 10 MG tablet Take 1 tablet (10 mg total) by mouth daily. 03/22/19  Yes Grayce Sessions, NP  dextromethorphan-guaiFENesin (MUCINEX DM) 30-600 MG 12hr tablet Take 1 tablet by mouth 2 (two) times daily. 03/22/19  Yes Grayce Sessions, NP  fluticasone (FLONASE) 50 MCG/ACT nasal spray Place 2 sprays into both nostrils daily. 03/22/19  Yes Grayce Sessions, NP  cephALEXin (KEFLEX) 500 MG capsule Take 1 capsule (500 mg  total) by mouth 3 (three) times daily. Patient not taking: Reported on 05/04/2019 03/22/19   Grayce Sessions, NP  hydrochlorothiazide (HYDRODIURIL) 25 MG tablet Take 1 tablet (25 mg total) by mouth daily. Patient not taking: Reported on 03/22/2019 08/30/18   Loletta Specter, PA-C  traZODone (DESYREL) 50 MG tablet Take 1 tablet (50 mg total) by mouth at bedtime. Patient not taking: Reported on 05/04/2019 03/22/19   Grayce Sessions, NP    Family History Family History  Problem Relation Age of Onset  . Hypertension Mother   . Hyperlipidemia Mother   . Hypertension Father   . Hyperlipidemia Father     Social History Social History   Tobacco Use  . Smoking status: Current Every Day Smoker  . Smokeless tobacco: Never Used  Substance Use Topics  . Alcohol use: Yes    Comment: beer  . Drug use: No     Allergies   Patient has no known allergies.   Review of Systems Review of Systems  Constitutional:       Per HPI, otherwise negative  HENT:       Per HPI, otherwise negative  Respiratory:       Per HPI, otherwise negative  Cardiovascular:       Per HPI, otherwise negative  Gastrointestinal: Positive for abdominal pain, nausea and vomiting.  Endocrine:       Negative aside from HPI  Genitourinary:  Neg aside from HPI   Musculoskeletal:       Per HPI, otherwise negative  Skin: Negative.   Neurological: Negative for syncope.     Physical Exam Updated Vital Signs BP 130/66   Pulse 80   Temp 98.7 F (37.1 C) (Oral)   Resp 20   SpO2 95%   Physical Exam Vitals signs and nursing note reviewed.  Constitutional:      Appearance: She is well-developed. She is obese. She is ill-appearing and diaphoretic.     Comments: Uncomfortable appearing obese adult female awake and alert.  HENT:     Head: Normocephalic and atraumatic.  Eyes:     Conjunctiva/sclera: Conjunctivae normal.  Cardiovascular:     Rate and Rhythm: Normal rate and regular rhythm.   Pulmonary:     Effort: Pulmonary effort is normal. No respiratory distress.     Breath sounds: Normal breath sounds. No stridor.  Abdominal:     General: There is no distension.     Tenderness: There is generalized abdominal tenderness. There is guarding.  Skin:    General: Skin is warm.  Neurological:     Mental Status: She is alert and oriented to person, place, and time.     Cranial Nerves: No cranial nerve deficit.      ED Treatments / Results  Labs (all labs ordered are listed, but only abnormal results are displayed) Labs Reviewed  LIPASE, BLOOD - Abnormal; Notable for the following components:      Result Value   Lipase 935 (*)    All other components within normal limits  COMPREHENSIVE METABOLIC PANEL - Abnormal; Notable for the following components:   Potassium 3.0 (*)    Glucose, Bld 112 (*)    AST 71 (*)    Alkaline Phosphatase 131 (*)    Anion gap 16 (*)    All other components within normal limits  CBC - Abnormal; Notable for the following components:   WBC 15.4 (*)    RBC 5.86 (*)    Hemoglobin 16.7 (*)    HCT 47.5 (*)    All other components within normal limits  URINALYSIS, ROUTINE W REFLEX MICROSCOPIC - Abnormal; Notable for the following components:   Color, Urine AMBER (*)    APPearance HAZY (*)    Bilirubin Urine SMALL (*)    Protein, ur 100 (*)    Leukocytes,Ua TRACE (*)    All other components within normal limits  HCG, QUANTITATIVE, PREGNANCY    EKG None  Radiology Ct Abdomen Pelvis W Contrast  Result Date: 05/04/2019 CLINICAL DATA:  48 year old female with acute abdominal and pelvic pain with vomiting today. EXAM: CT ABDOMEN AND PELVIS WITH CONTRAST TECHNIQUE: Multidetector CT imaging of the abdomen and pelvis was performed using the standard protocol following bolus administration of intravenous contrast. CONTRAST:  166mL OMNIPAQUE IOHEXOL 300 MG/ML  SOLN COMPARISON:  08/19/2018 CT FINDINGS: Lower chest: No acute abnormality.  Hepatobiliary: Hepatic steatosis again noted without focal hepatic abnormality. The gallbladder is unremarkable. No biliary dilatation. Pancreas: Mild to moderate peripancreatic inflammation is noted compatible with acute pancreatitis. There is no evidence of pancreatic necrosis, focal collection or venous thrombosis. Spleen: Unremarkable Adrenals/Urinary Tract: The kidneys, adrenal glands and bladder are unremarkable. Stomach/Bowel: Stomach is within normal limits. Appendix appears normal. No evidence of bowel wall thickening, distention, or inflammatory changes. Vascular/Lymphatic: Aortic atherosclerosis. No enlarged abdominal or pelvic lymph nodes. Reproductive: Uterus and bilateral adnexa are unremarkable. A 2.3 cm vaginal cyst is unchanged. Other: No  ascites, pneumoperitoneum or abdominal wall hernia. Musculoskeletal: No acute or suspicious bony abnormality. IMPRESSION: 1. Acute pancreatitis. No evidence of pancreatic necrosis, focal collection or venous thrombosis. 2. Hepatic steatosis. 3. Unchanged 2.3 cm vaginal cyst. 4.  Aortic Atherosclerosis (ICD10-I70.0). Electronically Signed   By: Harmon PierJeffrey  Hu M.D.   On: 05/04/2019 17:18    Procedures Procedures (including critical care time)  Medications Ordered in ED Medications  morphine 4 MG/ML injection 4 mg (4 mg Intravenous Given 05/04/19 1657)  ondansetron (ZOFRAN) injection 4 mg (4 mg Intravenous Given 05/04/19 1657)  iohexol (OMNIPAQUE) 300 MG/ML solution 100 mL (100 mLs Intravenous Contrast Given 05/04/19 1641)     Initial Impression / Assessment and Plan / ED Course  I have reviewed the triage vital signs and the nursing notes.  Pertinent labs & imaging results that were available during my care of the patient were reviewed by me and considered in my medical decision making (see chart for details).        6:53 PM 6:53 PM Patient in similar condition on repeat exam, describing pain in the upper abdomen. CT, labs consistent with acute  pancreatitis, no evidence for necrosis. With need for ongoing fluid resuscitation, analgesia, the patient will require admission.   Final Clinical Impressions(s) / ED Diagnoses   Final diagnoses:  Alcohol-induced acute pancreatitis without infection or necrosis     Gerhard MunchLockwood, Zamiya Dillard, MD 05/04/19 [phone]

## 2019-05-04 NOTE — ED Triage Notes (Signed)
EMS states pt woke this am with severe abd pain nausea and vomiting. 166/84-100-96% CBG 97

## 2019-05-04 NOTE — ED Notes (Signed)
ED TO INPATIENT HANDOFF REPORT  ED Nurse Name and Phone #: [place], [phone]  S Name/Age/Gender Robin Fischer 48 y.o. female Room/Bed: WA16/WA16  Code Status   Code Status: Prior  Home/SNF/Other Home Patient oriented to: self, place, time and situation Is this baseline? Yes   Triage Complete: Triage complete  Chief Complaint abd pain  Triage Note EMS states pt woke this am with severe abd pain nausea and vomiting. 166/84-100-96% CBG 97   Allergies No Known Allergies  Level of Care/Admitting Diagnosis ED Disposition    ED Disposition Condition Comment   Admit  [facility] [[id number]]  Level of Care: Telemetry [5]  Admit to tele based on following criteria: Other see comments  Comments: hypokalemia  Covid Evaluation: Asymptomatic Screening Protocol (No Symptoms)  Diagnosis: Pancreatitis [454098][[id number]]  Admitting Physician: Therisa DoyneUTOVA, ANASTASSIA [[id number]]  Attending Physician: Therisa DoyneUTOVA, ANASTASSIA [[id number]]  Estimated length of stay: 3 - 4 days  Certification:: I certify this patient will need inpatient services for at least 2 midnights  PT Class (Do Not Modify): Inpatient [101]  PT Acc Code (Do Not Modify): Private [1]       B Medical/Surgery History Past Medical History:  Diagnosis Date  . Asthma   . Bronchitis   . GERD (gastroesophageal reflux disease)   . High cholesterol   . Hypertension   . Pancreatitis   . Tobacco abuse    History reviewed. No pertinent surgical history.   A IV Location/Drains/Wounds Patient Lines/Drains/Airways Status   Active Line/Drains/Airways    Name:   Placement date:   Placement time:   Site:   Days:   Peripheral IV 05/04/19 Right Hand   05/04/19    -    Hand   less than 1          Intake/Output Last 24 hours No intake or output data in the 24 hours ending 05/04/19 2140  Labs/Imaging Results for orders placed or performed during the hospital encounter of 05/04/19 (from the past 48 hour(s))   Lipase, blood     Status: Abnormal   Collection Time: 05/04/19  3:45 PM  Result Value Ref Range   Lipase 935 (H) 11 - 51 U/L    Comment: RESULTS CONFIRMED BY MANUAL DILUTION Performed at [facility], [address] [place], [place] [address]   Comprehensive metabolic panel     Status: Abnormal   Collection Time: 05/04/19  3:45 PM  Result Value Ref Range   Sodium 141 135 - 145 mmol/L   Potassium 3.0 (L) 3.5 - 5.1 mmol/L   Chloride 100 98 - 111 mmol/L   CO2 25 22 - 32 mmol/L   Glucose, Bld 112 (H) 70 - 99 mg/dL   BUN 6 6 - 20 mg/dL   Creatinine, Ser 7.820.70 0.44 - 1.00 mg/dL   Calcium 9.7 8.9 - 95.610.3 mg/dL   Total Protein 7.7 6.5 - 8.1 g/dL   Albumin 4.2 3.5 - 5.0 g/dL   AST 71 (H) 15 - 41 U/L   ALT 32 0 - 44 U/L   Alkaline Phosphatase 131 (H) 38 - 126 U/L   Total Bilirubin 0.9 0.3 - 1.2 mg/dL   GFR calc non Af Amer >60 >60 mL/min   GFR calc Af Amer >60 >60 mL/min   Anion gap 16 (H) 5 - 15    Comment: Performed at [facility], [address] [place], [place] [address]  CBC     Status: Abnormal  Collection Time: 05/04/19  3:45 PM  Result Value Ref Range   WBC 15.4 (H) 4.0 - 10.5 K/uL   RBC 5.86 (H) 3.87 - 5.11 MIL/uL   Hemoglobin 16.7 (H) 12.0 - 15.0 g/dL   HCT 47.5 (H) 36.0 - 46.0 %   MCV 81.1 80.0 - 100.0 fL   MCH 28.5 26.0 - 34.0 pg   MCHC 35.2 30.0 - 36.0 g/dL   RDW 14.5 11.5 - 15.5 %   Platelets 348 150 - 400 K/uL   nRBC 0.0 0.0 - 0.2 %    Comment: Performed at [facility], [place] [address], [place], [place] [address]  Urinalysis, Routine w reflex microscopic     Status: Abnormal   Collection Time: 05/04/19  3:45 PM  Result Value Ref Range   Color, Urine AMBER (A) YELLOW    Comment: BIOCHEMICALS MAY BE AFFECTED BY COLOR   APPearance HAZY (A) CLEAR   Specific Gravity, Urine 1.021 1.005 - 1.030   pH 8.0 5.0 - 8.0   Glucose, UA NEGATIVE NEGATIVE mg/dL   Hgb urine dipstick NEGATIVE NEGATIVE   Bilirubin Urine  SMALL (A) NEGATIVE   Ketones, ur NEGATIVE NEGATIVE mg/dL   Protein, ur 100 (A) NEGATIVE mg/dL   Nitrite NEGATIVE NEGATIVE   Leukocytes,Ua TRACE (A) NEGATIVE   RBC / HPF 6-10 0 - 5 RBC/hpf   WBC, UA 6-10 0 - 5 WBC/hpf   Bacteria, UA NONE SEEN NONE SEEN   Squamous Epithelial / LPF 11-20 0 - 5   Mucus PRESENT     Comment: Performed at [facility], [place] [address], [place], [place] [address]  hCG, quantitative, pregnancy     Status: None   Collection Time: 05/04/19  3:48 PM  Result Value Ref Range   hCG, Beta Chain, Quant, S 3 <5 mIU/mL    Comment:          GEST. AGE      CONC.  (mIU/mL)   <=1 WEEK        5 - 50     2 WEEKS       50 - 500     3 WEEKS       100 - 10,000     4 WEEKS     1,000 - 30,000     5 WEEKS     3,500 - 115,000   6-8 WEEKS     12,000 - 270,000    12 WEEKS     15,000 - 220,000        FEMALE AND NON-PREGNANT FEMALE:     LESS THAN 5 mIU/mL Performed at [facility], [place] [address] [place], [place] [address]    Ct Abdomen Pelvis W Contrast  Result Date: 05/04/2019 CLINICAL DATA:  48 year old female with acute abdominal and pelvic pain with vomiting today. EXAM: CT ABDOMEN AND PELVIS WITH CONTRAST TECHNIQUE: Multidetector CT imaging of the abdomen and pelvis was performed using the standard protocol following bolus administration of intravenous contrast. CONTRAST:  110mL OMNIPAQUE IOHEXOL 300 MG/ML  SOLN COMPARISON:  08/19/2018 CT FINDINGS: Lower chest: No acute abnormality. Hepatobiliary: Hepatic steatosis again noted without focal hepatic abnormality. The gallbladder is unremarkable. No biliary dilatation. Pancreas: Mild to moderate peripancreatic inflammation is noted compatible with acute pancreatitis. There is no evidence of pancreatic necrosis, focal collection or venous thrombosis. Spleen: Unremarkable Adrenals/Urinary Tract: The kidneys, adrenal glands and bladder are unremarkable. Stomach/Bowel: Stomach is within normal limits.  Appendix appears normal. No  evidence of bowel wall thickening, distention, or inflammatory changes. Vascular/Lymphatic: Aortic atherosclerosis. No enlarged abdominal or pelvic lymph nodes. Reproductive: Uterus and bilateral adnexa are unremarkable. A 2.3 cm vaginal cyst is unchanged. Other: No ascites, pneumoperitoneum or abdominal wall hernia. Musculoskeletal: No acute or suspicious bony abnormality. IMPRESSION: 1. Acute pancreatitis. No evidence of pancreatic necrosis, focal collection or venous thrombosis. 2. Hepatic steatosis. 3. Unchanged 2.3 cm vaginal cyst. 4.  Aortic Atherosclerosis (ICD10-I70.0). Electronically Signed   By: Harmon PierJeffrey  Hu M.D.   On: 05/04/2019 17:18    Pending Labs Unresulted Labs (From admission, onward)    Start     Ordered   05/04/19 2131  Ethanol  Add-on,   AD     05/04/19 2130   05/04/19 2130  Magnesium  Add-on,   AD     05/04/19 2129   05/04/19 2130  Phosphorus  Add-on,   AD     05/04/19 2129   05/04/19 2130  CK  Add-on,   AD     05/04/19 2129   05/04/19 2130  Urine rapid drug screen (hosp performed)  ONCE - STAT,   STAT     05/04/19 2129   05/04/19 1855  SARS CORONAVIRUS 2 (TAT 6-12 HRS) Nasal Swab Aptima Multi Swab  (Asymptomatic/Tier 2 Patients Labs)  Once,   STAT    Question Answer Comment  Is this test for diagnosis or screening Screening   Symptomatic for COVID-19 as defined by CDC No   Hospitalized for COVID-19 No   Admitted to ICU for COVID-19 No   Previously tested for COVID-19 No   Resident in a congregate (group) care setting No   Employed in healthcare setting No   Pregnant No      05/04/19 1854   Signed and Held  Magnesium  Tomorrow morning,   R    Comments: Call MD if <1.5    Signed and Held   Signed and Held  Phosphorus  Tomorrow morning,   R     Signed and Held   Signed and Held  TSH  Once,   R    Comments: Cancel if already done within 1 month and notify MD    Signed and Held   Signed and Held  Comprehensive metabolic panel  Once,    R    Comments: Cal MD for K<3.5 or >5.0    Signed and Held   Signed and Held  CBC  Once,   R    Comments: Call for hg <8.0    Signed and Held          Vitals/Pain Today's Vitals   05/04/19 2000 05/04/19 2009 05/04/19 2030 05/04/19 2100  BP: (!) 151/76  134/66 133/73  Pulse: 80  75 74  Resp: 16  15 14   Temp:      TempSrc:      SpO2: 95%  91% 94%  PainSc:  8       Isolation Precautions No active isolations  Medications Medications  0.9 %  sodium chloride infusion ( Intravenous New Bag/Given 05/04/19 1930)  morphine 4 MG/ML injection 8 mg (8 mg Intravenous Given 05/04/19 1931)  potassium chloride 10 mEq in 100 mL IVPB (has no administration in time range)  potassium chloride SA (K-DUR) CR tablet 40 mEq (has no administration in time range)  morphine 4 MG/ML injection 4 mg (4 mg Intravenous Given 05/04/19 1657)  ondansetron (ZOFRAN) injection 4 mg (4 mg Intravenous Given 05/04/19 1657)  iohexol (OMNIPAQUE) 300 MG/ML  solution 100 mL (100 mLs Intravenous Contrast Given 05/04/19 1641)    Mobility walks Low fall risk

## 2019-05-04 NOTE — ED Notes (Signed)
Per Vanita Panda, MD pt is allowed to drink. Patient given PO fluids and was tolerating fine until she took PO potassium. She proceeded to vomit up water and potassium pill, admitting MD, Doutova aware. Pt stated she thinks that she just drank too quickly.

## 2019-05-04 NOTE — H&P (Signed)
Robin Fischer ZOX:[id number] DOB: [birth year] DOA: 05/04/2019     PCP: Grayce Sessions, NP   Outpatient Specialists:  NONE    Patient arrived to ER on 05/04/19 at 1518  Patient coming from: home     Chief Complaint:  Chief Complaint  Patient presents with  . Abdominal Pain  . Emesis  . Nausea    HPI: Robin Fischer is a 48 y.o. female with medical history significant of HTN, alcohol abuse, tobacco abuse, pancreatitis, asthma, HLD    Presented with severe abdominal pain nausea vomiting woke patient up this morning.  It was similar to the the prior episode of pancreatitis Reports pain has been persistent epigastric in nature.  Did not take any medicine to try to make it better.  Has been recently drinking again. Denies any chest pain or shortness of breath or fever. This admission for alcohol induced pancreatitis was in December 2019  States at her baseline he drinks 1 or 2 shots of liquor a day for the past few days she has been drinking about 8 a day. States she never has history of severe withdrawals but does get shakes and sweats when she does not drink. No history of hallucinations seizures or delirium tremens  States she smokes about 4 cigarettes a day also.  Interested in quitting.  Infectious risk factors:  Reports , N/V/ /abdominal pain,    In  ER RAPID COVID TEST  in house testing  Pending  No results found for: SARSCOV2NAA   Regarding pertinent Chronic problems:     HTN on hydrochlorothiazide and Norvasc   obesity-   BMI Readings from Last 1 Encounters:  03/22/19 33.05 kg/m       Asthma -well  controlled on home inhalers/ nebs    While in ER: Found to have a lip elevated lipase, CT scan showing acute pancreatitis but no evidence of necrosis noted to have hepatic steatosis The following Work up has been ordered so far:  Orders Placed This Encounter  Procedures  . SARS CORONAVIRUS 2 (TAT 6-12 HRS) Nasal Swab Aptima Multi Swab  . CT Abdomen  Pelvis W Contrast  . Lipase, blood  . Comprehensive metabolic panel  . CBC  . Urinalysis, Routine w reflex microscopic  . hCG, quantitative, pregnancy  . Diet NPO time specified  . Saline Lock IV, Maintain IV access  . Consult to hospitalist  ALL PATIENTS BEING ADMITTED/HAVING PROCEDURES NEED COVID-19 SCREENING   Following Medications were ordered in ER: Medications  0.9 %  sodium chloride infusion ( Intravenous New Bag/Given 05/04/19 1930)  morphine 4 MG/ML injection 8 mg (8 mg Intravenous Given 05/04/19 1931)  morphine 4 MG/ML injection 4 mg (4 mg Intravenous Given 05/04/19 1657)  ondansetron (ZOFRAN) injection 4 mg (4 mg Intravenous Given 05/04/19 1657)  iohexol (OMNIPAQUE) 300 MG/ML solution 100 mL (100 mLs Intravenous Contrast Given 05/04/19 1641)        Consult Orders  (From admission, onward)         Start     Ordered   05/04/19 1902  Consult to hospitalist  ALL PATIENTS BEING ADMITTED/HAVING PROCEDURES NEED COVID-19 SCREENING  Once    Comments: ALL PATIENTS BEING ADMITTED/HAVING PROCEDURES NEED COVID-19 SCREENING  Provider:  (Not yet assigned)  Question Answer Comment  Place call to: [facility]   Reason for Consult Admit      05/04/19 1901            Significant initial  Findings: Abnormal Labs Reviewed  LIPASE, BLOOD - Abnormal; Notable for the following components:      Result Value   Lipase 935 (*)    All other components within normal limits  COMPREHENSIVE METABOLIC PANEL - Abnormal; Notable for the following components:   Potassium 3.0 (*)    Glucose, Bld 112 (*)    AST 71 (*)    Alkaline Phosphatase 131 (*)    Anion gap 16 (*)    All other components within normal limits  CBC - Abnormal; Notable for the following components:   WBC 15.4 (*)    RBC 5.86 (*)    Hemoglobin 16.7 (*)    HCT 47.5 (*)    All other components within normal limits  URINALYSIS, ROUTINE W REFLEX MICROSCOPIC - Abnormal; Notable for the following components:   Color,  Urine AMBER (*)    APPearance HAZY (*)    Bilirubin Urine SMALL (*)    Protein, ur 100 (*)    Leukocytes,Ua TRACE (*)    All other components within normal limits     Otherwise labs showing:    Recent Labs  Lab 05/04/19 1545  NA 141  K 3.0*  CO2 25  GLUCOSE 112*  BUN 6  CREATININE 0.70  CALCIUM 9.7    Cr   stable,    Lab Results  Component Value Date   CREATININE 0.70 05/04/2019   CREATININE 0.60 08/25/2018   CREATININE 0.55 08/24/2018    Recent Labs  Lab 05/04/19 1545  AST 71*  ALT 32  ALKPHOS 131*  BILITOT 0.9  PROT 7.7  ALBUMIN 4.2   Lab Results  Component Value Date   CALCIUM 9.7 05/04/2019     WBC      Component Value Date/Time   WBC 15.4 (H) 05/04/2019 1545   ANC    Component Value Date/Time   NEUTROABS 11.0 (H) 08/22/2018 0645   NEUTROABS 3.8 02/22/2018 1110   ALC No components found for: LYMPHAB    Plt: Lab Results  Component Value Date   PLT 348 05/04/2019   COVID-19 Labs   No results found for: SARSCOV2NAA    HG/HCT  Stable,     Component Value Date/Time   HGB 16.7 (H) 05/04/2019 1545   HGB 17.0 (H) 02/22/2018 1110   HCT 47.5 (H) 05/04/2019 1545   HCT 48.6 (H) 02/22/2018 1110    Recent Labs  Lab 05/04/19 1545  LIPASE 935*     ECG:  Not Ordered    UA  no evidence of UTI      Urine analysis:    Component Value Date/Time   COLORURINE AMBER (A) 05/04/2019 1545   APPEARANCEUR HAZY (A) 05/04/2019 1545   LABSPEC 1.021 05/04/2019 1545   PHURINE 8.0 05/04/2019 1545   GLUCOSEU NEGATIVE 05/04/2019 1545   HGBUR NEGATIVE 05/04/2019 1545   BILIRUBINUR SMALL (A) 05/04/2019 1545   BILIRUBINUR negative 01/18/2017 1658   KETONESUR NEGATIVE 05/04/2019 1545   PROTEINUR 100 (A) 05/04/2019 1545   UROBILINOGEN 0.2 01/18/2017 1658   NITRITE NEGATIVE 05/04/2019 1545   LEUKOCYTESUR TRACE (A) 05/04/2019 1545    CTabd/pelvis -evidence of pancreatitis and hepatic steatosis    ED Triage Vitals  Enc Vitals Group     BP 05/04/19  1532 (!) 152/128     Pulse Rate 05/04/19 1532 81     Resp 05/04/19 1532 20     Temp 05/04/19 1532 98.7 F (37.1 C)     Temp Source 05/04/19 1532 Oral     SpO2  05/04/19 1532 99 %     Weight --      Height --      Head Circumference --      Peak Flow --      Pain Score 05/04/19 1527 10     Pain Loc --      Pain Edu? --      Excl. in GC? --   TMAX(24)@       Latest  Blood pressure 134/66, pulse 75, temperature 98.7 F (37.1 C), temperature source Oral, resp. rate 15, SpO2 91 %.     Hospitalist was called for admission for alcohol induced pancreatitis   Review of Systems:    Pertinent positives include:  abdominal pain, nausea, vomiting  Constitutional:  No weight loss, night sweats, Fevers, chills, fatigue, weight loss  HEENT:  No headaches, Difficulty swallowing,Tooth/dental problems,Sore throat,  No sneezing, itching, ear ache, nasal congestion, post nasal drip,  Cardio-vascular:  No chest pain, Orthopnea, PND, anasarca, dizziness, palpitations.no Bilateral lower extremity swelling  GI:  No heartburn, indigestion,, diarrhea, change in bowel habits, loss of appetite, melena, blood in stool, hematemesis Resp:  no shortness of breath at rest. No dyspnea on exertion, No excess mucus, no productive cough, No non-productive cough, No coughing up of blood.No change in color of mucus.No wheezing. Skin:  no rash or lesions. No jaundice GU:  no dysuria, change in color of urine, no urgency or frequency. No straining to urinate.  No flank pain.  Musculoskeletal:  No joint pain or no joint swelling. No decreased range of motion. No back pain.  Psych:  No change in mood or affect. No depression or anxiety. No memory loss.  Neuro: no localizing neurological complaints, no tingling, no weakness, no double vision, no gait abnormality, no slurred speech, no confusion  All systems reviewed and apart from HOPI all are negative  Past Medical History:   Past Medical History:   Diagnosis Date  . Asthma   . Bronchitis   . GERD (gastroesophageal reflux disease)   . High cholesterol   . Hypertension   . Pancreatitis   . Tobacco abuse     History reviewed. No pertinent surgical history.  Social History:  Ambulatory   independently      reports that she has been smoking. She has never used smokeless tobacco. She reports current alcohol use. She reports that she does not use drugs.   Family History:   Family History  Problem Relation Age of Onset  . Hypertension Mother   . Hyperlipidemia Mother   . Hypertension Father   . Hyperlipidemia Father     Allergies: No Known Allergies   Prior to Admission medications   Medication Sig Start Date End Date Taking? Authorizing Provider  amLODipine (NORVASC) 10 MG tablet Take 1 tablet (10 mg total) by mouth daily. 03/22/19  Yes Grayce SessionsEdwards, Michelle P, NP  dextromethorphan-guaiFENesin (MUCINEX DM) 30-600 MG 12hr tablet Take 1 tablet by mouth 2 (two) times daily. 03/22/19  Yes Grayce SessionsEdwards, Michelle P, NP  fluticasone (FLONASE) 50 MCG/ACT nasal spray Place 2 sprays into both nostrils daily. 03/22/19  Yes Grayce SessionsEdwards, Michelle P, NP  cephALEXin (KEFLEX) 500 MG capsule Take 1 capsule (500 mg total) by mouth 3 (three) times daily. Patient not taking: Reported on 05/04/2019 03/22/19   Grayce SessionsEdwards, Michelle P, NP  hydrochlorothiazide (HYDRODIURIL) 25 MG tablet Take 1 tablet (25 mg total) by mouth daily. Patient not taking: Reported on 03/22/2019 08/30/18   Loletta SpecterGomez, Roger David, PA-C  traZODone [facility](DESYREL)  50 MG tablet Take 1 tablet (50 mg total) by mouth at bedtime. Patient not taking: Reported on 05/04/2019 03/22/19   Grayce Sessions, NP   Physical Exam: Blood pressure 134/66, pulse 75, temperature 98.7 F (37.1 C), temperature source Oral, resp. rate 15, SpO2 91 %. 1. General:  in No Acute distress     Chronically ill  -appearing 2. Psychological: Alert and  Oriented 3. Head/ENT:   Dry Mucous Membranes                          Head Non  traumatic, neck supple                          Poor Dentition 4. SKIN:   decreased Skin turgor,  Skin clean Dry and intact no rash 5. Heart: Regular rate and rhythm no Murmur, no Rub or gallop 6. Lungs:   no wheezes or crackles   7. Abdomen: Soft,  Epigastric tender, Non distended  bowel sounds present 8. Lower extremities: no clubbing, cyanosis, no  edema 9. Neurologically Grossly intact, moving all 4 extremities equally MILD tremor 10. MSK: Normal range of motion   All other LABS:     Recent Labs  Lab 05/04/19 1545  WBC 15.4*  HGB 16.7*  HCT 47.5*  MCV 81.1  PLT 348     Recent Labs  Lab 05/04/19 1545  NA 141  K 3.0*  CL 100  CO2 25  GLUCOSE 112*  BUN 6  CREATININE 0.70  CALCIUM 9.7     Recent Labs  Lab 05/04/19 1545  AST 71*  ALT 32  ALKPHOS 131*  BILITOT 0.9  PROT 7.7  ALBUMIN 4.2       Cultures:    Component Value Date/Time   SDES URINE, RANDOM 03/11/2017 1604   SPECREQUEST NONE 03/11/2017 1604   CULT  03/11/2017 1604    NO GROWTH Performed at [facility], [address], [place], [place] [address]    REPTSTATUS 03/12/2017 FINAL 03/11/2017 1604     Radiological Exams on Admission: Ct Abdomen Pelvis W Contrast  Result Date: 05/04/2019 CLINICAL DATA:  48 year old female with acute abdominal and pelvic pain with vomiting today. EXAM: CT ABDOMEN AND PELVIS WITH CONTRAST TECHNIQUE: Multidetector CT imaging of the abdomen and pelvis was performed using the standard protocol following bolus administration of intravenous contrast. CONTRAST:  OMNIPAQUE IOHEXOL 300 MG/ML  SOLN COMPARISON:  08/19/2018 CT FINDINGS: Lower chest: No acute abnormality. Hepatobiliary: Hepatic steatosis again noted without focal hepatic abnormality. The gallbladder is unremarkable. No biliary dilatation. Pancreas: Mild to moderate peripancreatic inflammation is noted compatible with acute pancreatitis. There is no evidence of pancreatic necrosis, focal collection or  venous thrombosis. Spleen: Unremarkable Adrenals/Urinary Tract: The kidneys, adrenal glands and bladder are unremarkable. Stomach/Bowel: Stomach is within normal limits. Appendix appears normal. No evidence of bowel wall thickening, distention, or inflammatory changes. Vascular/Lymphatic: Aortic atherosclerosis. No enlarged abdominal or pelvic lymph nodes. Reproductive: Uterus and bilateral adnexa are unremarkable. A 2.3 cm vaginal cyst is unchanged. Other: No ascites, pneumoperitoneum or abdominal wall hernia. Musculoskeletal: No acute or suspicious bony abnormality. IMPRESSION: 1. Acute pancreatitis. No evidence of pancreatic necrosis, focal collection or venous thrombosis. 2. Hepatic steatosis. 3. Unchanged 2.3 cm vaginal cyst. 4.  Aortic Atherosclerosis (ICD10-I70.0). Electronically Signed   By: Harmon Pier M.D.   On: 05/04/2019 17:18    Chart has been reviewed  Assessment/Plan  48 y.o. female with medical history significant of HTN, alcohol abuse, tobacco abuse, pancreatitis, asthma, HLD  Admitted for alcohol-induced pancreatitis  Present on Admission:  . Alcoholic pancreatitis - most likely cause being alcohol induced,    CT of abdomen showing acute pancreatitis  no evidence of pseudocyst Will rehydrate with aggressive IV fluids Keep n.p.o. Follow clinically  -  strongly advised patient to stop drinking alcohol -Check lipid panel - Discontinue offending medications ( hydrochlorothiazide, ) Control pain with IV pain medications If no significant improvement in the next 24 hours may benefit from GI consult    . Asthma -chronic stable continue home medication  . Hypertension -hold hydrochlorothiazide    . Tobacco abuse -  - Spoke about importance of quitting spent 5 minutes discussing options for treatment, prior attempts at quitting, and dangers of smoking  -At this point patient is    interested in quitting  - order nicotine patch   - nursing tobacco cessation protocol  .  Alcohol abuse - CIWA protocol negative for any sign of withdrawal discussed with patient importance of quitting order social work consult  . Hypokalemia - - will replace and repeat in AM,  check magnesium level and replace as needed  . Dehydration -  - we'll administer IV fluids and recheck orthostatics in the morning  . Leukocytosis -chronic stable continue to monitor suspect some degree of hemoconcentration  Other plan as per orders.  DVT prophylaxis:  SCD     Code Status:  FULL CODE   Family Communication:   Family not at  Bedside   Disposition Plan:     To home once workup is complete and patient is stable                       Social Work  consulted                                        Consults called: none   Admission status:  ED Disposition    ED Disposition Condition Comment   Admit  Hospital [facility] [[id number]]  Level of Care: Telemetry [5]  Admit to tele based on following criteria: Other see comments  Comments: hypokalemia  Covid Evaluation: Asymptomatic Screening Protocol (No Symptoms)  Diagnosis: Pancreatitis [010272]  Admitting Physician: Therisa Doyne [[id number]]  Attending Physician: Therisa Doyne [[id number]]  Estimated length of stay: 3 - 4 days  Certification:: I certify this patient will need inpatient services for at least 2 midnights  PT Class (Do Not Modify): Inpatient [101]  PT Acc Code (Do Not Modify): Private [1]            inpatient     Expect 2 midnight stay secondary to severity of patient's current illness including      Severe lab/radiological/exam abnormalities including:  pancreatitis   and extensive comorbidities including:  substance abuse    asthma   That are currently affecting medical management.   I expect  patient to be hospitalized for 2 midnights requiring inpatient medical care.  Patient is at high risk for adverse outcome (such as loss of life or disability) if not treated.   Indication for inpatient stay as follows:    severe pain requiring acute inpatient management,  inability to maintain oral hydration     Need for  IV fluids, , IV pain medications  Level of care     tele  For 24H     Precautions:  Droplet/contact,  PPE: Used by the provider:   P100  eye Goggles,  Gloves    Aftin Lye 05/04/2019, 11:08 PM    [facility]     after 2 AM please page floor coverage PA If 7AM-7PM, please contact the day team taking care of the patient using [email or web address]

## 2019-05-05 ENCOUNTER — Other Ambulatory Visit: Payer: Self-pay

## 2019-05-05 LAB — COMPREHENSIVE METABOLIC PANEL
ALT: 26 U/L (ref 0–44)
AST: 43 U/L — ABNORMAL HIGH (ref 15–41)
Albumin: 3.5 g/dL (ref 3.5–5.0)
Alkaline Phosphatase: 108 U/L (ref 38–126)
Anion gap: 10 (ref 5–15)
BUN: 9 mg/dL (ref 6–20)
CO2: 30 mmol/L (ref 22–32)
Calcium: 9.1 mg/dL (ref 8.9–10.3)
Chloride: 101 mmol/L (ref 98–111)
Creatinine, Ser: 0.66 mg/dL (ref 0.44–1.00)
GFR calc Af Amer: >60 mL/min (ref >60)
GFR calc non Af Amer: >60 mL/min (ref >60)
Glucose, Bld: 99 mg/dL (ref 70–99)
Potassium: 3.7 mmol/L (ref 3.5–5.1)
Sodium: 141 mmol/L (ref 135–145)
Total Bilirubin: 1 mg/dL (ref 0.3–1.2)
Total Protein: 6.8 g/dL (ref 6.5–8.1)

## 2019-05-05 LAB — TSH: TSH: 1.806 u[IU]/mL (ref 0.350–4.500)

## 2019-05-05 LAB — CBC
HCT: 44.2 % (ref 36.0–46.0)
Hemoglobin: 15.2 g/dL — ABNORMAL HIGH (ref 12.0–15.0)
MCH: 28.5 pg (ref 26.0–34.0)
MCHC: 34.4 g/dL (ref 30.0–36.0)
MCV: 82.8 fL (ref 80.0–100.0)
Platelets: 306 10*3/uL (ref 150–400)
RBC: 5.34 MIL/uL — ABNORMAL HIGH (ref 3.87–5.11)
RDW: 14.6 % (ref 11.5–15.5)
WBC: 13.4 10*3/uL — ABNORMAL HIGH (ref 4.0–10.5)
nRBC: 0 % (ref 0.0–0.2)

## 2019-05-05 LAB — ETHANOL: Alcohol, Ethyl (B): <10 mg/dL (ref <10)

## 2019-05-05 LAB — CK: Total CK: 52 U/L (ref 38–234)

## 2019-05-05 LAB — PHOSPHORUS: Phosphorus: 5.2 mg/dL — ABNORMAL HIGH (ref 2.5–4.6)

## 2019-05-05 LAB — LIPID PANEL
Cholesterol: 281 mg/dL — ABNORMAL HIGH (ref 0–200)
HDL: 39 mg/dL — ABNORMAL LOW (ref >40)
LDL Cholesterol: 208 mg/dL — ABNORMAL HIGH (ref 0–99)
Total CHOL/HDL Ratio: 7.2 RATIO
Triglycerides: 172 mg/dL — ABNORMAL HIGH (ref <150)
VLDL: 34 mg/dL (ref 0–40)

## 2019-05-05 LAB — SARS CORONAVIRUS 2 (TAT 6-24 HRS): SARS Coronavirus 2: NEGATIVE

## 2019-05-05 LAB — MAGNESIUM: Magnesium: 2 mg/dL (ref 1.7–2.4)

## 2019-05-05 LAB — LIPASE, BLOOD: Lipase: 560 U/L — ABNORMAL HIGH (ref 11–51)

## 2019-05-05 MED ORDER — FAMOTIDINE IN NACL 20-0.9 MG/50ML-% IV SOLN
20.0000 mg | Freq: Every day | INTRAVENOUS | Status: DC
  Administered 2019-05-05 – 2019-05-07 (×3): 20 mg via INTRAVENOUS
  Filled 2019-05-05 (×3): qty 50

## 2019-05-05 MED ORDER — ALUM & MAG HYDROXIDE-SIMETH 200-200-20 MG/5ML PO SUSP
15.0000 mL | ORAL | Status: DC | PRN
  Administered 2019-05-05 – 2019-05-07 (×3): 15 mL via ORAL
  Filled 2019-05-05 (×3): qty 30

## 2019-05-05 NOTE — Progress Notes (Signed)
[facility] Progress Note  Patient: Robin Fischer ASN:[id number]   PCP: [name], NP DOB: 02/02/1971   DOA: 05/04/2019   DOS: 05/05/2019   Date of Service: the patient was seen and examined on 05/05/2019  Brief hospital course: Pt. with PMH of  HTN, alcohol abuse, tobacco abuse, pancreatitis, asthma, HLD; admitted on 05/04/2019, presented with complaint of abdominal pain , was found to have acute pancreatitis. Currently further plan is continue supportive care.  Subjective: still has abdominal pain, no BM no gas, no fever chills nausea. Has some shortnes of breath  Assessment and Plan: Acute Alcoholic pancreatitis CT of abdomen showing acute pancreatitis  no evidence of pseudocyst Will rehydrate with aggressive IV fluids Keep n.p.o. strongly advised patient to stop drinking alcohol Discontinue offending medications ( hydrochlorothiazide, ) Control pain with IV pain medications If no significant improvement in the next 24 hours may benefit from GI consult  Asthma  chronic stable continue home medication  Hypertension  hold hydrochlorothiazide    Tobacco abuse Spoke about importance of quitting order nicotine patch  nursing tobacco cessation protocol  Alcohol abuse Last drink was  3 days ago before admission  No sign of withdrawal  discussed with patient importance of quitting  social work consult CIWA protocol  Hypokalemia Replaced   Dehydration On IVF  Leukocytosis chronic stable continue to monitor suspect some degree of hemoconcentration  Diet: NPO  DVT Prophylaxis: Subcutaneous Heparin   Advance goals of care discussion: full code  Family Communication: no family was present at bedside, at the time of interview.   Disposition:  Discharge to Home .  Consultants: none Procedures: none  Scheduled Meds:  amLODipine  10 mg Oral Daily   nicotine  21 mg Transdermal Daily   thiamine  100 mg Oral Daily   Or   thiamine  100 mg  Intravenous Daily   Continuous Infusions:  sodium chloride 125 mL/hr at 05/05/19 1113   famotidine (PEPCID) IV 20 mg (05/05/19 1120)   PRN Meds: acetaminophen **OR** acetaminophen, alum & mag hydroxide-simeth, HYDROcodone-acetaminophen, HYDROmorphone (DILAUDID) injection, LORazepam **OR** LORazepam, ondansetron **OR** ondansetron (ZOFRAN) IV Antibiotics: Anti-infectives (From admission, onward)   None       Objective: Physical Exam: Vitals:   05/05/19 0207 05/05/19 0533 05/05/19 1200 05/05/19 1440  BP: 134/78 139/82 133/82 (!) 156/78  Pulse: 69 72 77 79  Resp: 16 16  18   Temp: 98.4 F (36.9 C) 98.8 F (37.1 C)  98.4 F (36.9 C)  TempSrc: Oral Oral  Oral  SpO2: 94% 97%  99%  Weight:      Height:        Intake/Output Summary (Last 24 hours) at 05/05/2019 1502 Last data filed at 05/05/2019 0600 Gross per 24 hour  Intake 1084.93 ml  Output --  Net 1084.93 ml   Filed Weights   05/04/19 2243  Weight: 95.2 kg   General: alert and oriented to time, place, and person. Appear in moderate distress, affect appropriate Eyes: PERRL, Conjunctiva normal ENT: Oral Mucosa Clear, moist  Neck: no JVD, no Abnormal Mass Or lumps Cardiovascular: S1 and S2 Present, no Murmur, peripheral pulses symmetrical Respiratory: normal respiratory effort, Bilateral Air entry equal and Decreased, no use of accessory muscle, Clear to Auscultation, no Crackles, no wheezes Abdomen: Bowel Sound present, Soft and mild diffuse tenderness, no hernia Skin: no rashes  Extremities: no Pedal edema, no calf tenderness Neurologic: normal without focal findings, mental status, speech normal, alert and oriented x3, PERLA, Motor strength 5/5  and symmetric and sensation grossly normal to light touch Gait not checked due to patient safety concerns  Data Reviewed: CBC: Recent Labs  Lab 05/04/19 1545 05/05/19 0456  WBC 15.4* 13.4*  HGB 16.7* 15.2*  HCT 47.5* 44.2  MCV 81.1 82.8  PLT 348 306   Basic  Metabolic Panel: Recent Labs  Lab 05/04/19 1545 05/05/19 0456  NA 141 141  K 3.0* 3.7  CL 100 101  CO2 25 30  GLUCOSE 112* 99  BUN 6 9  CREATININE 0.70 0.66  CALCIUM 9.7 9.1  MG  --  2.0  PHOS  --  5.2*    Liver Function Tests: Recent Labs  Lab 05/04/19 1545 05/05/19 0456  AST 71* 43*  ALT 32 26  ALKPHOS 131* 108  BILITOT 0.9 1.0  PROT 7.7 6.8  ALBUMIN 4.2 3.5   Recent Labs  Lab 05/04/19 1545 05/05/19 0456  LIPASE 935* 560*   No results for input(s): AMMONIA in the last 168 hours. Coagulation Profile: No results for input(s): INR, PROTIME in the last 168 hours. Cardiac Enzymes: Recent Labs  Lab 05/05/19 0456  CKTOTAL 52   BNP (last 3 results) No results for input(s): PROBNP in the last 8760 hours. CBG: No results for input(s): GLUCAP in the last 168 hours. Studies: Ct Abdomen Pelvis W Contrast  Result Date: 05/04/2019 CLINICAL DATA:  48 year old female with acute abdominal and pelvic pain with vomiting today. EXAM: CT ABDOMEN AND PELVIS WITH CONTRAST TECHNIQUE: Multidetector CT imaging of the abdomen and pelvis was performed using the standard protocol following bolus administration of intravenous contrast. CONTRAST:  100mL OMNIPAQUE IOHEXOL 300 MG/ML  SOLN COMPARISON:  08/19/2018 CT FINDINGS: Lower chest: No acute abnormality. Hepatobiliary: Hepatic steatosis again noted without focal hepatic abnormality. The gallbladder is unremarkable. No biliary dilatation. Pancreas: Mild to moderate peripancreatic inflammation is noted compatible with acute pancreatitis. There is no evidence of pancreatic necrosis, focal collection or venous thrombosis. Spleen: Unremarkable Adrenals/Urinary Tract: The kidneys, adrenal glands and bladder are unremarkable. Stomach/Bowel: Stomach is within normal limits. Appendix appears normal. No evidence of bowel wall thickening, distention, or inflammatory changes. Vascular/Lymphatic: Aortic atherosclerosis. No enlarged abdominal or pelvic  lymph nodes. Reproductive: Uterus and bilateral adnexa are unremarkable. A 2.3 cm vaginal cyst is unchanged. Other: No ascites, pneumoperitoneum or abdominal wall hernia. Musculoskeletal: No acute or suspicious bony abnormality. IMPRESSION: 1. Acute pancreatitis. No evidence of pancreatic necrosis, focal collection or venous thrombosis. 2. Hepatic steatosis. 3. Unchanged 2.3 cm vaginal cyst. 4.  Aortic Atherosclerosis (ICD10-I70.0). Electronically Signed   By: Harmon PierJeffrey  Hu M.D.   On: 05/04/2019 17:18     Time spent: 35 minutes  Author: Lynden OxfordPranav Adaleah Forget, MD [facility] 05/05/2019 3:02 PM  To reach On-call, see care teams to locate the attending and reach out to them via [email or web address]. If 7PM-7AM, please contact night-coverage If you still have difficulty reaching the attending provider, please page the [facility] (Director on Call) for [facility] on amion for assistance.

## 2019-05-05 NOTE — Plan of Care (Signed)
Pt had small episode of vomiting at 1500. Small amount of green/yellow bile. Pt resting in bed. Will continue to monitor.

## 2019-05-06 ENCOUNTER — Inpatient Hospital Stay (HOSPITAL_COMMUNITY): Payer: Medicaid Other

## 2019-05-06 LAB — CBC
HCT: 41.5 % (ref 36.0–46.0)
Hemoglobin: 14.4 g/dL (ref 12.0–15.0)
MCH: 28.7 pg (ref 26.0–34.0)
MCHC: 34.7 g/dL (ref 30.0–36.0)
MCV: 82.8 fL (ref 80.0–100.0)
Platelets: 291 10*3/uL (ref 150–400)
RBC: 5.01 MIL/uL (ref 3.87–5.11)
RDW: 14.3 % (ref 11.5–15.5)
WBC: 11.7 10*3/uL — ABNORMAL HIGH (ref 4.0–10.5)
nRBC: 0 % (ref 0.0–0.2)

## 2019-05-06 LAB — COMPREHENSIVE METABOLIC PANEL
ALT: 22 U/L (ref 0–44)
AST: 40 U/L (ref 15–41)
Albumin: 3.3 g/dL — ABNORMAL LOW (ref 3.5–5.0)
Alkaline Phosphatase: 108 U/L (ref 38–126)
Anion gap: 14 (ref 5–15)
BUN: 6 mg/dL (ref 6–20)
CO2: 23 mmol/L (ref 22–32)
Calcium: 8.5 mg/dL — ABNORMAL LOW (ref 8.9–10.3)
Chloride: 99 mmol/L (ref 98–111)
Creatinine, Ser: 0.58 mg/dL (ref 0.44–1.00)
GFR calc Af Amer: >60 mL/min (ref >60)
GFR calc non Af Amer: >60 mL/min (ref >60)
Glucose, Bld: 70 mg/dL (ref 70–99)
Potassium: 3.4 mmol/L — ABNORMAL LOW (ref 3.5–5.1)
Sodium: 136 mmol/L (ref 135–145)
Total Bilirubin: 1.5 mg/dL — ABNORMAL HIGH (ref 0.3–1.2)
Total Protein: 6.4 g/dL — ABNORMAL LOW (ref 6.5–8.1)

## 2019-05-06 LAB — LIPASE, BLOOD: Lipase: 113 U/L — ABNORMAL HIGH (ref 11–51)

## 2019-05-06 MED ORDER — DIPHENHYDRAMINE HCL 50 MG/ML IJ SOLN
25.0000 mg | Freq: Four times a day (QID) | INTRAMUSCULAR | Status: DC | PRN
  Administered 2019-05-06: 22:00:00 25 mg via INTRAVENOUS
  Filled 2019-05-06: qty 1

## 2019-05-06 NOTE — Progress Notes (Signed)
[facility] Progress Note  Patient: Robin Fischer [id number]   PCP: Grayce SessionsEdwards, Michelle P, NP DOB: 11/23/1970   DOA: 05/04/2019   DOS: 05/06/2019   Date of Service: the patient was seen and examined on 05/06/2019  Brief hospital course: Pt. with PMH of  HTN, alcohol abuse, tobacco abuse, pancreatitis, asthma, HLD; admitted on 05/04/2019, presented with complaint of abdominal pain , was found to have acute pancreatitis. Currently further plan is continue supportive care.  Subjective: Minimal abdominal pain.  Tolerating clear liquid diet.  No nausea no vomiting.  Actually had a BM.  Passing gas.  Assessment and Plan: Acute Alcoholic pancreatitis CT of abdomen showing acute pancreatitis  no evidence of pseudocyst Will rehydrate with aggressive IV fluids strongly advised patient to stop drinking alcohol Discontinue offending medications ( hydrochlorothiazide, ) Control pain with IV pain medications Tolerating clear liquid diet for the morning, advance to full liquid diet.  Asthma  chronic stable continue home medication  Hypertension  hold hydrochlorothiazide    Tobacco abuse Spoke about importance of quitting order nicotine patch  nursing tobacco cessation protocol  Alcohol abuse Last drink was  3 days ago before admission  No sign of withdrawal  discussed with patient importance of quitting  social work consult CIWA protocol  Hypokalemia Replaced   Dehydration On IVF  Leukocytosis chronic stable continue to monitor suspect some degree of hemoconcentration  Diet: Full liquid diet, advance to soft diet for lunch tomorrow if tolerates it. DVT Prophylaxis: Subcutaneous Heparin   Advance goals of care discussion: full code  Family Communication: no family was present at bedside, at the time of interview.   Disposition:  Discharge to Home .  Consultants: none Procedures: none  Scheduled Meds: . amLODipine  10 mg Oral Daily  . nicotine  21 mg  Transdermal Daily  . thiamine  100 mg Oral Daily   Or  . thiamine  100 mg Intravenous Daily   Continuous Infusions: . sodium chloride 75 mL/hr at 05/06/19 1145  . famotidine (PEPCID) IV 20 mg (05/06/19 0856)   PRN Meds: acetaminophen **OR** acetaminophen, alum & mag hydroxide-simeth, HYDROcodone-acetaminophen, HYDROmorphone (DILAUDID) injection, LORazepam **OR** LORazepam, ondansetron **OR** ondansetron (ZOFRAN) IV Antibiotics: Anti-infectives (From admission, onward)   None       Objective: Physical Exam: Vitals:   05/05/19 2019 05/06/19 0526 05/06/19 0855 05/06/19 1532  BP: 133/72 (!) 164/90 (!) 157/89 128/76  Pulse: 76 84  79  Resp: 16 16  18   Temp: 98.4 F (36.9 C) 99.5 F (37.5 C)  98.7 F (37.1 C)  TempSrc: Oral Oral  Oral  SpO2: 95% 96%  100%  Weight:      Height:        Intake/Output Summary (Last 24 hours) at 05/06/2019 1738 Last data filed at 05/06/2019 1629 Gross per 24 hour  Intake 3558.48 ml  Output 1200 ml  Net 2358.48 ml   Filed Weights   05/04/19 2243  Weight: 95.2 kg   General: alert and oriented to time, place, and person. Appear in moderate distress, affect appropriate Eyes: PERRL, Conjunctiva normal ENT: Oral Mucosa Clear, moist  Neck: no JVD, no Abnormal Mass Or lumps Cardiovascular: S1 and S2 Present, no Murmur, peripheral pulses symmetrical Respiratory: normal respiratory effort, Bilateral Air entry equal and Decreased, no use of accessory muscle, Clear to Auscultation, no Crackles, no wheezes Abdomen: Bowel Sound present, Soft and mild diffuse tenderness, no hernia Skin: no rashes  Extremities: no Pedal edema, no calf tenderness Neurologic: normal without focal  findings, mental status, speech normal, alert and oriented x3, PERLA, Motor strength 5/5 and symmetric and sensation grossly normal to light touch Gait not checked due to patient safety concerns  Data Reviewed: CBC: Recent Labs  Lab 05/04/19 1545 05/05/19 0456 05/06/19 0744   WBC 15.4* 13.4* 11.7*  HGB 16.7* 15.2* 14.4  HCT 47.5* 44.2 41.5  MCV 81.1 82.8 82.8  PLT 348 306 678   Basic Metabolic Panel: Recent Labs  Lab 05/04/19 1545 05/05/19 0456 05/06/19 0744  NA 141 141 136  K 3.0* 3.7 3.4*  CL 100 101 99  CO2 25 30 23   GLUCOSE 112* 99 70  BUN 6 9 6   CREATININE 0.70 0.66 0.58  CALCIUM 9.7 9.1 8.5*  MG  --  2.0  --   PHOS  --  5.2*  --     Liver Function Tests: Recent Labs  Lab 05/04/19 1545 05/05/19 0456 05/06/19 0744  AST 71* 43* 40  ALT 32 26 22  ALKPHOS 131* 108 108  BILITOT 0.9 1.0 1.5*  PROT 7.7 6.8 6.4*  ALBUMIN 4.2 3.5 3.3*   Recent Labs  Lab 05/04/19 1545 05/05/19 0456 05/06/19 0744  LIPASE 935* 560* 113*   No results for input(s): AMMONIA in the last 168 hours. Coagulation Profile: No results for input(s): INR, PROTIME in the last 168 hours. Cardiac Enzymes: Recent Labs  Lab 05/05/19 0456  CKTOTAL 52   BNP (last 3 results) No results for input(s): PROBNP in the last 8760 hours. CBG: No results for input(s): GLUCAP in the last 168 hours. Studies: Dg Abd Portable 1v  Result Date: 05/06/2019 CLINICAL DATA:  Intractable nausea and vomiting. Pancreatitis. EXAM: PORTABLE ABDOMEN - 1 VIEW COMPARISON:  CT scan dated 05/04/2019 FINDINGS: The bowel gas pattern is normal. Hepatomegaly. No visible free air or free fluid on the supine radiograph. No significant bone abnormality. IMPRESSION: Benign-appearing abdomen except for hepatomegaly. Electronically Signed   By: Lorriane Shire M.D.   On: 05/06/2019 10:31     Time spent: 35 minutes  Author: Berle Mull, MD [facility] 05/06/2019 5:38 PM  To reach On-call, see care teams to locate the attending and reach out to them via [email or web address]. If 7PM-7AM, please contact night-coverage If you still have difficulty reaching the attending provider, please page the [facility] (Director on Call) for [facility] on amion for assistance.

## 2019-05-07 LAB — COMPREHENSIVE METABOLIC PANEL
ALT: 24 U/L (ref 0–44)
AST: 55 U/L — ABNORMAL HIGH (ref 15–41)
Albumin: 3.4 g/dL — ABNORMAL LOW (ref 3.5–5.0)
Alkaline Phosphatase: 118 U/L (ref 38–126)
Anion gap: 6 (ref 5–15)
BUN: <5 mg/dL — ABNORMAL LOW (ref 6–20)
CO2: 27 mmol/L (ref 22–32)
Calcium: 8.7 mg/dL — ABNORMAL LOW (ref 8.9–10.3)
Chloride: 103 mmol/L (ref 98–111)
Creatinine, Ser: 0.56 mg/dL (ref 0.44–1.00)
GFR calc Af Amer: >60 mL/min (ref >60)
GFR calc non Af Amer: >60 mL/min (ref >60)
Glucose, Bld: 94 mg/dL (ref 70–99)
Potassium: 4.2 mmol/L (ref 3.5–5.1)
Sodium: 136 mmol/L (ref 135–145)
Total Bilirubin: 1.8 mg/dL — ABNORMAL HIGH (ref 0.3–1.2)
Total Protein: 7 g/dL (ref 6.5–8.1)

## 2019-05-07 LAB — CBC
HCT: 42.4 % (ref 36.0–46.0)
Hemoglobin: 14.6 g/dL (ref 12.0–15.0)
MCH: 28.6 pg (ref 26.0–34.0)
MCHC: 34.4 g/dL (ref 30.0–36.0)
MCV: 83 fL (ref 80.0–100.0)
Platelets: 325 10*3/uL (ref 150–400)
RBC: 5.11 MIL/uL (ref 3.87–5.11)
RDW: 14.2 % (ref 11.5–15.5)
WBC: 9.5 10*3/uL (ref 4.0–10.5)
nRBC: 0 % (ref 0.0–0.2)

## 2019-05-07 LAB — LIPASE, BLOOD: Lipase: 94 U/L — ABNORMAL HIGH (ref 11–51)

## 2019-05-07 MED ORDER — DIPHENHYDRAMINE HCL 25 MG PO CAPS
25.0000 mg | ORAL_CAPSULE | Freq: Four times a day (QID) | ORAL | Status: DC | PRN
  Administered 2019-05-07: 25 mg via ORAL
  Filled 2019-05-07: qty 1

## 2019-05-07 MED ORDER — SIMETHICONE 80 MG PO CHEW
80.0000 mg | CHEWABLE_TABLET | Freq: Four times a day (QID) | ORAL | Status: DC
  Administered 2019-05-07 (×3): 80 mg via ORAL
  Filled 2019-05-07 (×3): qty 1

## 2019-05-07 MED ORDER — FAMOTIDINE 20 MG PO TABS: 20.0000 mg | ORAL_TABLET | Freq: Every day | ORAL | Status: DC

## 2019-05-07 NOTE — Progress Notes (Signed)
[facility] Progress Note  Patient: Robin Fischer [id number]   PCP: Grayce SessionsEdwards, Michelle P, NP DOB: 04/26/1971   DOA: 05/04/2019   DOS: 05/07/2019   Date of Service: the patient was seen and examined on 05/07/2019  Brief hospital course: Pt. with PMH of  HTN, alcohol abuse, tobacco abuse, pancreatitis, asthma, HLD; admitted on 05/04/2019, presented with complaint of abdominal pain , was found to have acute pancreatitis. Currently further plan is continue supportive care.  Subjective: No nausea no vomiting.  After eating had a poor pain.  No diarrhea.  Assessment and Plan: Acute Alcoholic pancreatitis CT of abdomen showing acute pancreatitis  no evidence of pseudocyst Will rehydrate with aggressive IV fluids strongly advised patient to stop drinking alcohol Discontinue offending medications ( hydrochlorothiazide, ) Control pain with IV pain medications Tolerating full liquid diet.  Had some increased pain after soft diet.  Will monitor.  Asthma  chronic stable continue home medication  Hypertension  hold hydrochlorothiazide    Tobacco abuse Spoke about importance of quitting order nicotine patch  nursing tobacco cessation protocol  Alcohol abuse Last drink was  3 days ago before admission  No sign of withdrawal  discussed with patient importance of quitting  social work consult CIWA protocol  Hypokalemia Replaced   Dehydration Resolved  Leukocytosis chronic stable continue to monitor suspect some degree of hemoconcentration  Diet: Soft diet DVT Prophylaxis: Subcutaneous Heparin   Advance goals of care discussion: full code  Family Communication: no family was present at bedside, at the time of interview.   Disposition:  Discharge to Home .  Consultants: none Procedures: none  Scheduled Meds: . amLODipine  10 mg Oral Daily  . [START ON 05/08/2019] famotidine  20 mg Oral Daily  . nicotine  21 mg Transdermal Daily  . simethicone  80 mg Oral QID  .  thiamine  100 mg Oral Daily   Continuous Infusions: . sodium chloride Stopped (05/07/19 1230)   PRN Meds: acetaminophen **OR** acetaminophen, alum & mag hydroxide-simeth, diphenhydrAMINE, HYDROcodone-acetaminophen, HYDROmorphone (DILAUDID) injection, LORazepam **OR** LORazepam, ondansetron **OR** ondansetron (ZOFRAN) IV Antibiotics: Anti-infectives (From admission, onward)   None       Objective: Physical Exam: Vitals:   05/06/19 2208 05/07/19 0526 05/07/19 1249 05/07/19 1839  BP: (!) 143/82 133/70 (!) 162/91 (!) 167/98  Pulse: 70 75 78 82  Resp: 18 18 18 18   Temp: 98.6 F (37 C) 98.5 F (36.9 C) 98.6 F (37 C) 98.3 F (36.8 C)  TempSrc: Oral Oral Oral Oral  SpO2: 100% 99% 98% 100%  Weight:      Height:        Intake/Output Summary (Last 24 hours) at 05/07/2019 1928 Last data filed at 05/07/2019 1600 Gross per 24 hour  Intake 1925.52 ml  Output 500 ml  Net 1425.52 ml   Filed Weights   05/04/19 2243  Weight: 95.2 kg   General: alert and oriented to time, place, and person. Appear in moderate distress, affect appropriate Eyes: PERRL, Conjunctiva normal ENT: Oral Mucosa Clear, moist  Neck: no JVD, no Abnormal Mass Or lumps Cardiovascular: S1 and S2 Present, no Murmur, peripheral pulses symmetrical Respiratory: normal respiratory effort, Bilateral Air entry equal and Decreased, no use of accessory muscle, Clear to Auscultation, no Crackles, no wheezes Abdomen: Bowel Sound present, Soft and mild diffuse tenderness, no hernia Skin: no rashes  Extremities: no Pedal edema, no calf tenderness Neurologic: normal without focal findings, mental status, speech normal, alert and oriented x3, PERLA, Motor strength 5/5 and  symmetric and sensation grossly normal to light touch Gait not checked due to patient safety concerns  Data Reviewed: CBC: Recent Labs  Lab 05/04/19 1545 05/05/19 0456 05/06/19 0744 05/07/19 0521  WBC 15.4* 13.4* 11.7* 9.5  HGB 16.7* 15.2* 14.4 14.6  HCT  47.5* 44.2 41.5 42.4  MCV 81.1 82.8 82.8 83.0  PLT 348 306 291 557   Basic Metabolic Panel: Recent Labs  Lab 05/04/19 1545 05/05/19 0456 05/06/19 0744 05/07/19 0521  NA 141 141 136 136  K 3.0* 3.7 3.4* 4.2  CL 100 101 99 103  CO2 25 30 23 27   GLUCOSE 112* 99 70 94  BUN 6 9 6  <5*  CREATININE 0.70 0.66 0.58 0.56  CALCIUM 9.7 9.1 8.5* 8.7*  MG  --  2.0  --   --   PHOS  --  5.2*  --   --     Liver Function Tests: Recent Labs  Lab 05/04/19 1545 05/05/19 0456 05/06/19 0744 05/07/19 0521  AST 71* 43* 40 55*  ALT 32 26 22 24   ALKPHOS 131* 108 108 118  BILITOT 0.9 1.0 1.5* 1.8*  PROT 7.7 6.8 6.4* 7.0  ALBUMIN 4.2 3.5 3.3* 3.4*   Recent Labs  Lab 05/04/19 1545 05/05/19 0456 05/06/19 0744 05/07/19 0521  LIPASE 935* 560* 113* 94*   No results for input(s): AMMONIA in the last 168 hours. Coagulation Profile: No results for input(s): INR, PROTIME in the last 168 hours. Cardiac Enzymes: Recent Labs  Lab 05/05/19 0456  CKTOTAL 52   BNP (last 3 results) No results for input(s): PROBNP in the last 8760 hours. CBG: No results for input(s): GLUCAP in the last 168 hours. Studies: No results found.   Time spent: 35 minutes  Author: Berle Mull, MD [facility] 05/07/2019 7:28 PM  To reach On-call, see care teams to locate the attending and reach out to them via [email or web address]. If 7PM-7AM, please contact night-coverage If you still have difficulty reaching the attending provider, please page the [facility] (Director on Call) for [facility] on amion for assistance.

## 2019-05-08 LAB — LIPASE, BLOOD: Lipase: 72 U/L — ABNORMAL HIGH (ref 11–51)

## 2019-05-08 LAB — CBC
HCT: 41.6 % (ref 36.0–46.0)
Hemoglobin: 14.5 g/dL (ref 12.0–15.0)
MCH: 28.8 pg (ref 26.0–34.0)
MCHC: 34.9 g/dL (ref 30.0–36.0)
MCV: 82.5 fL (ref 80.0–100.0)
Platelets: 316 10*3/uL (ref 150–400)
RBC: 5.04 MIL/uL (ref 3.87–5.11)
RDW: 14.3 % (ref 11.5–15.5)
WBC: 8.8 10*3/uL (ref 4.0–10.5)
nRBC: 0 % (ref 0.0–0.2)

## 2019-05-08 LAB — COMPREHENSIVE METABOLIC PANEL
ALT: 26 U/L (ref 0–44)
AST: 60 U/L — ABNORMAL HIGH (ref 15–41)
Albumin: 3.6 g/dL (ref 3.5–5.0)
Alkaline Phosphatase: 116 U/L (ref 38–126)
Anion gap: 9 (ref 5–15)
BUN: <5 mg/dL — ABNORMAL LOW (ref 6–20)
CO2: 26 mmol/L (ref 22–32)
Calcium: 8.9 mg/dL (ref 8.9–10.3)
Chloride: 104 mmol/L (ref 98–111)
Creatinine, Ser: 0.55 mg/dL (ref 0.44–1.00)
GFR calc Af Amer: >60 mL/min (ref >60)
GFR calc non Af Amer: >60 mL/min (ref >60)
Glucose, Bld: 108 mg/dL — ABNORMAL HIGH (ref 70–99)
Potassium: 3.6 mmol/L (ref 3.5–5.1)
Sodium: 139 mmol/L (ref 135–145)
Total Bilirubin: 1.1 mg/dL (ref 0.3–1.2)
Total Protein: 7.1 g/dL (ref 6.5–8.1)

## 2019-05-08 MED ORDER — FAMOTIDINE 20 MG PO TABS: 20.0000 mg | ORAL_TABLET | Freq: Every day | ORAL | 0 refills | Status: DC

## 2019-05-08 MED ORDER — NICOTINE 21 MG/24HR TD PT24: 21.0000 mg | MEDICATED_PATCH | Freq: Every day | TRANSDERMAL | 0 refills | Status: DC

## 2019-05-08 MED ORDER — SIMETHICONE 80 MG PO CHEW: 160.0000 mg | CHEWABLE_TABLET | Freq: Four times a day (QID) | ORAL | 0 refills | Status: DC | PRN

## 2019-05-08 MED ORDER — THIAMINE HCL 100 MG PO TABS: 100.0000 mg | ORAL_TABLET | Freq: Every day | ORAL | Status: DC

## 2019-05-08 MED ORDER — AMLODIPINE BESYLATE 10 MG PO TABS: 10.0000 mg | ORAL_TABLET | Freq: Every day | ORAL | 1 refills | Status: DC

## 2019-05-08 NOTE — Discharge Summary (Signed)
Physician Discharge Summary  Robin Fischer ZOX:[id number] DOB: 1970/10/20 DOA: 05/04/2019  PCP: Grayce SessionsEdwards, Michelle P, NP  Admit date: 05/04/2019 Discharge date: 05/08/2019  Time spent: 50 minutes  Recommendations for Outpatient Follow-up:  1. Follow-up with Grayce SessionsEdwards, Michelle P, NP in 2 weeks.  On follow-up patient's blood pressure need to be reassessed as patient's HCTZ has been discontinued.  Patient just discharged on home regimen of Norvasc.  Patient will need a basic metabolic profile done to follow-up on electrolytes and renal function. 2.    Discharge Diagnoses:  Principal Problem:   Alcoholic pancreatitis Active Problems:   Asthma   Hypertension   Hypokalemia   Tobacco abuse   Leukocytosis   Alcohol abuse   Dehydration   Pancreatitis   Discharge Condition: Stable and improved  Diet recommendation: Heart healthy  Filed Weights   05/04/19 2243  Weight: 95.2 kg    History of present illness:  HPI per Dr Samule Ohmoutova Shalawn Littles is a 48 y.o. female with medical history significant of HTN, alcohol abuse, tobacco abuse, pancreatitis, asthma, HLD    Presented with severe abdominal pain nausea vomiting woke patient up this morning.  It was similar to the the prior episode of pancreatitis Reports pain has been persistent epigastric in nature.  Did not take any medicine to try to make it better.  Has been recently drinking again. Denies any chest pain or shortness of breath or fever. This admission for alcohol induced pancreatitis was in December 2019  States at her baseline he drinks 1 or 2 shots of liquor a day for the past few days she has been drinking about 8 a day. States she never has history of severe withdrawals but does get shakes and sweats when she does not drink. No history of hallucinations seizures or delirium tremens  States she smokes about 4 cigarettes a day also.  Interested in quitting.  Infectious risk factors:  Reports , N/V/ /abdominal pain,     In  ER RAPID COVID TEST  in house testing  Pending  Recent Labs  No results found for: SARSCOV2NAA     Regarding pertinent Chronic problems:     HTN on hydrochlorothiazide and Norvasc   obesity-      BMI Readings from Last 1 Encounters:  03/22/19 33.05 kg/m       Asthma -well  controlled on home inhalers/ nebs    While in ER: Found to have a lip elevated lipase, CT scan showing acute pancreatitis but no evidence of necrosis noted to have hepatic steatosis The following Work up has been ordered so far:  Hospital Course:  1 acute alcoholic pancreatitis Patient was admitted with acute alcoholic pancreatitis.  CT abdomen and pelvis which was done was consistent with pancreatitis with no evidence of pseudocyst.  Patient was initially placed on bowel rest, aggressive IV fluid resuscitation and alcohol cessation was stressed to patient.  Patient's HCTZ was subsequently discontinued.  Patient was placed on pain management, antiemetics, IV fluids.  As patient improved clinically patient was started on a clear liquid diet and diet advanced to full liquid diet which she tolerated.  Patient improved clinically and diet was advanced to a soft diet which he tolerated.  Patient had no further abdominal pain.  No nausea or vomiting.  Patient will be discharged home in stable and improved condition with close outpatient follow-up with PCP.  2.  Asthma Remained stable throughout the hospitalization.  3.  Hypertension Patient's HCTZ was discontinued secondary to problem #1.  Patient was maintained on home regimen of Norvasc.  Blood pressure remained controlled on Norvasc.  Patient be discharged home on Norvasc only.  Outpatient follow-up with PCP.  4.  Tobacco abuse Tobacco cessation was stressed to patient.  Patient was placed on a nicotine patch.  Outpatient follow-up.  5.  History of alcohol abuse Patient noted to have last drink 3 days prior to admission.  Patient remained in stable  condition with no signs of withdrawal during the hospitalization.  Importance of alcohol cessation was stressed to patient.  Patient was also placed on the Ativan withdrawal protocol.  Patient remained in stable condition.  6.  Hypokalemia Repleted.  7.  Dehydration Patient aggressively hydrated with IV fluids.  Patient was euvolemic by day of discharge.  8.  Leukocytosis Felt secondary to hemoconcentration from dehydration.  Patient remained afebrile.  Leukocytosis had resolved by day of discharge.  Procedures:  Ct abd/Pelvis 05/04/2019  Acute Abdominal series 05/06/2019    Consultations:  None  Discharge Exam: Vitals:   05/07/19 2247 05/08/19 0652  BP: (!) 150/84 137/85  Pulse: 71 64  Resp: 20 18  Temp: 98.7 F (37.1 C) 98.7 F (37.1 C)  SpO2: 94% 99%    General: NAD Cardiovascular: RRR Respiratory: CTAB  Discharge Instructions   Discharge Instructions    Diet - low sodium heart healthy   Complete by: As directed    Increase activity slowly   Complete by: As directed      Allergies as of 05/08/2019   No Known Allergies     Medication List    STOP taking these medications   cephALEXin 500 MG capsule Commonly known as: KEFLEX   dextromethorphan-guaiFENesin 30-600 MG 12hr tablet Commonly known as: MUCINEX DM   hydrochlorothiazide 25 MG tablet Commonly known as: HYDRODIURIL   traZODone 50 MG tablet Commonly known as: DESYREL     TAKE these medications   amLODipine 10 MG tablet Commonly known as: NORVASC Take 1 tablet (10 mg total) by mouth daily.   famotidine 20 MG tablet Commonly known as: PEPCID Take 1 tablet (20 mg total) by mouth daily.   fluticasone 50 MCG/ACT nasal spray Commonly known as: FLONASE Place 2 sprays into both nostrils daily.   nicotine 21 mg/24hr patch Commonly known as: NICODERM CQ - dosed in mg/24 hours Place 1 patch (21 mg total) onto the skin daily.   simethicone 80 MG chewable tablet Commonly known as:  MYLICON Chew 2 tablets (160 mg total) by mouth 4 (four) times daily as needed for flatulence.   thiamine 100 MG tablet Take 1 tablet (100 mg total) by mouth daily.      No Known Allergies Follow-up Information    Grayce Sessions, NP. Schedule an appointment as soon as possible for a visit in 2 week(s).   Specialty: Internal Medicine Contact information: [address] [place] [address] [phone]            The results of significant diagnostics from this hospitalization (including imaging, microbiology, ancillary and laboratory) are listed below for reference.    Significant Diagnostic Studies: Ct Abdomen Pelvis W Contrast  Result Date: 05/04/2019 CLINICAL DATA:  48 year old female with acute abdominal and pelvic pain with vomiting today. EXAM: CT ABDOMEN AND PELVIS WITH CONTRAST TECHNIQUE: Multidetector CT imaging of the abdomen and pelvis was performed using the standard protocol following bolus administration of intravenous contrast. CONTRAST:  OMNIPAQUE IOHEXOL 300 MG/ML  SOLN COMPARISON:  08/19/2018 CT FINDINGS: Lower chest: No acute  abnormality. Hepatobiliary: Hepatic steatosis again noted without focal hepatic abnormality. The gallbladder is unremarkable. No biliary dilatation. Pancreas: Mild to moderate peripancreatic inflammation is noted compatible with acute pancreatitis. There is no evidence of pancreatic necrosis, focal collection or venous thrombosis. Spleen: Unremarkable Adrenals/Urinary Tract: The kidneys, adrenal glands and bladder are unremarkable. Stomach/Bowel: Stomach is within normal limits. Appendix appears normal. No evidence of bowel wall thickening, distention, or inflammatory changes. Vascular/Lymphatic: Aortic atherosclerosis. No enlarged abdominal or pelvic lymph nodes. Reproductive: Uterus and bilateral adnexa are unremarkable. A 2.3 cm vaginal cyst is unchanged. Other: No ascites, pneumoperitoneum or abdominal wall hernia.  Musculoskeletal: No acute or suspicious bony abnormality. IMPRESSION: 1. Acute pancreatitis. No evidence of pancreatic necrosis, focal collection or venous thrombosis. 2. Hepatic steatosis. 3. Unchanged 2.3 cm vaginal cyst. 4.  Aortic Atherosclerosis (ICD10-I70.0). Electronically Signed   By: Margarette Canada M.D.   On: 05/04/2019 17:18   Dg Abd Portable 1v  Result Date: 05/06/2019 CLINICAL DATA:  Intractable nausea and vomiting. Pancreatitis. EXAM: PORTABLE ABDOMEN - 1 VIEW COMPARISON:  CT scan dated 05/04/2019 FINDINGS: The bowel gas pattern is normal. Hepatomegaly. No visible free air or free fluid on the supine radiograph. No significant bone abnormality. IMPRESSION: Benign-appearing abdomen except for hepatomegaly. Electronically Signed   By: Lorriane Shire M.D.   On: 05/06/2019 10:31    Microbiology: Recent Results (from the past 240 hour(s))  SARS CORONAVIRUS 2 (TAT 6-12 HRS) Nasal Swab Aptima Multi Swab     Status: None   Collection Time: 05/04/19  6:55 PM   Specimen: Aptima Multi Swab; Nasal Swab  Result Value Ref Range Status   SARS Coronavirus 2 NEGATIVE NEGATIVE Final    Comment: (NOTE) SARS-CoV-2 target nucleic acids are NOT DETECTED. The SARS-CoV-2 RNA is generally detectable in upper and lower respiratory specimens during the acute phase of infection. Negative results do not preclude SARS-CoV-2 infection, do not rule out co-infections with other pathogens, and should not be used as the sole basis for treatment or other patient management decisions. Negative results must be combined with clinical observations, patient history, and epidemiological information. The expected result is Negative. Fact Sheet for Patients: SugarRoll.be Fact Sheet for Healthcare Providers: https://www.woods-mathews.com/ This test is not yet approved or cleared by the [place] FDA and  has been authorized for detection and/or diagnosis of SARS-CoV-2 by FDA  under an Emergency Use Authorization (EUA). This EUA will remain  in effect (meaning this test can be used) for the duration of the COVID-19 declaration under Section 56 4(b)(1) of the Act, 21 U.S.C. section 360bbb-3(b)(1), unless the authorization is terminated or revoked sooner. Performed at [facility], [place] [address], [place], [place] [address]      Labs: Basic Metabolic Panel: Recent Labs  Lab 05/04/19 1545 05/05/19 0456 05/06/19 0744 05/07/19 0521 05/08/19 0430  NA 141 141 136 136 139  K 3.0* 3.7 3.4* 4.2 3.6  CL 100 101 99 103 104  CO2 25 30 23 27 26   GLUCOSE 112* 99 70 94 108*  BUN 6 9 6  <5* <5*  CREATININE 0.70 0.66 0.58 0.56 0.55  CALCIUM 9.7 9.1 8.5* 8.7* 8.9  MG  --  2.0  --   --   --   PHOS  --  5.2*  --   --   --    Liver Function Tests: Recent Labs  Lab 05/04/19 1545 05/05/19 0456 05/06/19 0744 05/07/19 0521 05/08/19 0430  AST 71* 43* 40 55* 60*  ALT 32 26  22 24 26   ALKPHOS 131* 108 108 118 116  BILITOT 0.9 1.0 1.5* 1.8* 1.1  PROT 7.7 6.8 6.4* 7.0 7.1  ALBUMIN 4.2 3.5 3.3* 3.4* 3.6   Recent Labs  Lab 05/04/19 1545 05/05/19 0456 05/06/19 0744 05/07/19 0521 05/08/19 0430  LIPASE 935* 560* 113* 94* 72*   No results for input(s): AMMONIA in the last 168 hours. CBC: Recent Labs  Lab 05/04/19 1545 05/05/19 0456 05/06/19 0744 05/07/19 0521 05/08/19 0430  WBC 15.4* 13.4* 11.7* 9.5 8.8  HGB 16.7* 15.2* 14.4 14.6 14.5  HCT 47.5* 44.2 41.5 42.4 41.6  MCV 81.1 82.8 82.8 83.0 82.5  PLT 348 306 291 325 316   Cardiac Enzymes: Recent Labs  Lab 05/05/19 0456  CKTOTAL 52   BNP: BNP (last 3 results) No results for input(s): BNP in the last 8760 hours.  ProBNP (last 3 results) No results for input(s): PROBNP in the last 8760 hours.  CBG: No results for input(s): GLUCAP in the last 168 hours.     Signed:  Ramiro Harvestaniel Desmin Daleo MD.  [facility] 05/08/2019, 9:17 AM

## 2019-05-22 ENCOUNTER — Telehealth (INDEPENDENT_AMBULATORY_CARE_PROVIDER_SITE_OTHER): Payer: Self-pay

## 2019-05-22 NOTE — Telephone Encounter (Signed)
[facility] called to inform PCP that they were seeing Robin Fischer.   [phone]  

## 2019-05-22 NOTE — Telephone Encounter (Signed)
[facility] called to inform PCP that they were seeing Robin Fischer.   [phone]

## 2019-05-22 NOTE — Telephone Encounter (Signed)
[facility] called to inform PCP that they were seeing Robin Fischer.   [phone]   This is just an FYI  Thank you Whitney Post

## 2019-06-06 ENCOUNTER — Telehealth (INDEPENDENT_AMBULATORY_CARE_PROVIDER_SITE_OTHER): Payer: Self-pay

## 2019-06-06 MED ORDER — FAMOTIDINE 20 MG PO TABS: 20.0000 mg | ORAL_TABLET | Freq: Every day | ORAL | 1 refills | Status: DC

## 2019-06-06 NOTE — Telephone Encounter (Signed)
Patient called to make a medication refill for   famotidine (PEPCID) 20 MG tablet   Patient uses   [facility]   Please advice [phone]  Thank you Whitney Post

## 2019-06-06 NOTE — Telephone Encounter (Signed)
Refill sent. Nat Christen, CMA

## 2019-07-13 ENCOUNTER — Other Ambulatory Visit (INDEPENDENT_AMBULATORY_CARE_PROVIDER_SITE_OTHER): Payer: Self-pay | Admitting: Primary Care

## 2019-07-15 NOTE — Telephone Encounter (Signed)
Needs an appointment.

## 2019-07-15 NOTE — Telephone Encounter (Signed)
FWD to PCP. Tempestt S Roberts, CMA  

## 2019-09-19 ENCOUNTER — Other Ambulatory Visit (INDEPENDENT_AMBULATORY_CARE_PROVIDER_SITE_OTHER): Payer: Self-pay | Admitting: Primary Care

## 2019-09-26 ENCOUNTER — Encounter (INDEPENDENT_AMBULATORY_CARE_PROVIDER_SITE_OTHER): Payer: Self-pay | Admitting: Primary Care

## 2019-09-26 ENCOUNTER — Other Ambulatory Visit: Payer: Self-pay

## 2019-09-26 ENCOUNTER — Telehealth (INDEPENDENT_AMBULATORY_CARE_PROVIDER_SITE_OTHER): Payer: Self-pay

## 2019-09-26 ENCOUNTER — Ambulatory Visit (INDEPENDENT_AMBULATORY_CARE_PROVIDER_SITE_OTHER): Payer: Medicaid Other | Admitting: Primary Care

## 2019-09-26 DIAGNOSIS — I10 Essential (primary) hypertension: Secondary | ICD-10-CM

## 2019-09-26 DIAGNOSIS — J4520 Mild intermittent asthma, uncomplicated: Secondary | ICD-10-CM | POA: Diagnosis not present

## 2019-09-26 DIAGNOSIS — F4323 Adjustment disorder with mixed anxiety and depressed mood: Secondary | ICD-10-CM

## 2019-09-26 DIAGNOSIS — G4700 Insomnia, unspecified: Secondary | ICD-10-CM | POA: Diagnosis not present

## 2019-09-26 DIAGNOSIS — Z760 Encounter for issue of repeat prescription: Secondary | ICD-10-CM

## 2019-09-26 MED ORDER — BUDESONIDE 180 MCG/ACT IN AEPB: 2.0000 | INHALATION_SPRAY | Freq: Two times a day (BID) | RESPIRATORY_TRACT | 5 refills | Status: DC

## 2019-09-26 MED ORDER — GUAIFENESIN 100 MG/5ML PO SOLN: 5.0000 mL | ORAL | 0 refills | Status: DC | PRN

## 2019-09-26 MED ORDER — FAMOTIDINE 20 MG PO TABS: 20.0000 mg | ORAL_TABLET | Freq: Every day | ORAL | 0 refills | Status: DC

## 2019-09-26 MED ORDER — TRAZODONE HCL 100 MG PO TABS: 50.0000 mg | ORAL_TABLET | Freq: Every day | ORAL | 1 refills | Status: DC

## 2019-09-26 MED ORDER — ALBUTEROL SULFATE HFA 108 (90 BASE) MCG/ACT IN AERS: 2.0000 | INHALATION_SPRAY | Freq: Four times a day (QID) | RESPIRATORY_TRACT | 1 refills | Status: DC | PRN

## 2019-09-26 MED ORDER — AMLODIPINE BESYLATE 10 MG PO TABS: 10.0000 mg | ORAL_TABLET | Freq: Every day | ORAL | 1 refills | Status: DC

## 2019-09-26 MED ORDER — BLOOD PRESSURE MONITOR KIT: 1.0000 | PACK | Freq: Three times a day (TID) | 0 refills | Status: AC | PRN

## 2019-09-26 MED ORDER — CETIRIZINE HCL 10 MG PO TABS: 10.0000 mg | ORAL_TABLET | Freq: Every day | ORAL | 11 refills | Status: DC

## 2019-09-26 NOTE — Progress Notes (Signed)
Virtual Visit via Telephone Note  I connected with Robin Fischer on 09/26/19 at  1:30 PM EST by telephone and verified that I am speaking with the correct person using two identifiers.   I discussed the limitations, risks, security and privacy concerns of performing an evaluation and management service by telephone and the availability of in person appointments. I also discussed with the patient that there may be a patient responsible charge related to this service. The patient expressed understanding and agreed to proceed.   History of Present Illness: [name] is having an tele visit for acute care she voices productive sputum, shortness of breath and wheezing. She responded unsure If she has been in contact with someone with COVID.    Observations/Objective: Review of Systems  Respiratory: Positive for sputum production, shortness of breath and wheezing.        Asthma   Neurological: Positive for headaches.  Psychiatric/Behavioral: Positive for depression. The patient has insomnia.   All other systems reviewed and are negative.  Assessment and Plan: Amberly was seen today for hypertension, gastroesophageal reflux and sinusitis.  Diagnoses and all orders for this visit:  Hypertension, unspecified type Counseled on blood pressure goal of less than 130/80, low-sodium, DASH diet, medication compliance, 150 minutes of moderate intensity exercise per week. Discussed medication compliance, adverse effects.  Mild intermittent asthma, unspecified whether complicated  classic symptoms which include cough, wheeze, and shortness of breath are characteristic of asthma and relieved by bronchodilating medications.   Adjustment disorder with mixed anxiety and depressed mood PHQ 21 will refer to CSW for follow up and treatment options . Currently on Trazodone for sleep  Insomnia, unspecified type Managed by trazodone at this time helpful hints quiet areas for sleeping, no radio, tv ,  inductive temperature for sleeping , no sugars or caffeine   Other orders/ Medication refill -     Blood Pressure Monitor KIT; 1 Bag by Does not apply route 3 (three) times daily as needed. -     amLODipine (NORVASC) 10 MG tablet; Take 1 tablet (10 mg total) by mouth daily. -     famotidine (PEPCID) 20 MG tablet; Take 1 tablet (20 mg total) by mouth daily. -     traZODone (DESYREL) 100 MG tablet; Take 0.5 tablets (50 mg total) by mouth at bedtime. -     cetirizine (ZYRTEC) 10 MG tablet; Take 1 tablet (10 mg total) by mouth daily. -     guaiFENesin (ROBITUSSIN) 100 MG/5ML SOLN; Take 5 mLs (100 mg total) by mouth every 4 (four) hours as needed for cough or to loosen phlegm. -     albuterol (VENTOLIN HFA) 108 (90 Base) MCG/ACT inhaler; Inhale 2 puffs into the lungs every 6 (six) hours as needed for wheezing or shortness of breath. -     budesonide (PULMICORT) 180 MCG/ACT inhaler; Inhale 2 puffs into the lungs 2 (two) times daily.   Follow Up Instructions:    I discussed the assessment and treatment plan with the patient. The patient was provided an opportunity to ask questions and all were answered. The patient agreed with the plan and demonstrated an understanding of the instructions.   The patient was advised to call back or seek an in-person evaluation if the symptoms worsen or if the condition fails to improve as anticipated.  I provided 14 minutes of non-face-to-face time during this encounter.   [name], NP

## 2019-09-26 NOTE — Telephone Encounter (Signed)
Pt called to report that she checked her BP around 12:30 in the left arm and it was 134/93. She had her fathers nurse check it again at 2:44 in the right arm and her reading was 124/77. Maryjean Morn, CMA

## 2019-10-15 ENCOUNTER — Institutional Professional Consult (permissible substitution) (INDEPENDENT_AMBULATORY_CARE_PROVIDER_SITE_OTHER): Payer: Medicaid Other | Admitting: Licensed Clinical Social Worker

## 2019-10-22 ENCOUNTER — Ambulatory Visit (INDEPENDENT_AMBULATORY_CARE_PROVIDER_SITE_OTHER): Payer: Medicaid Other | Admitting: Licensed Clinical Social Worker

## 2019-10-22 ENCOUNTER — Other Ambulatory Visit: Payer: Self-pay

## 2019-10-22 DIAGNOSIS — F4323 Adjustment disorder with mixed anxiety and depressed mood: Secondary | ICD-10-CM

## 2019-10-22 NOTE — Progress Notes (Signed)
Integrated Behavioral Health Visit via Telemedicine (Telephone)  10/22/2019 Claudette Laws [id number]   Session Start time: 9:00 AM  Session End time: 9:15 AM Total time: 15  Referring Provider: NP Randa Evens Type of Visit: Telephonic Patient location: [facility] Provider location: Office All persons participating in visit: LCSW and Patient   Confirmed patient's address: Yes  Confirmed patient's phone number: Yes  Any changes to demographics: No   Confirmed patient's insurance: Yes  Any changes to patient's insurance: No   Discussed confidentiality: Yes    The following statements were read to the patient and/or legal guardian that are established with the [facility] Provider.  "The purpose of this phone visit is to provide behavioral health care while limiting exposure to the coronavirus (COVID19).  There is a possibility of technology failure and discussed alternative modes of communication if that failure occurs."  "By engaging in this telephone visit, you consent to the provision of healthcare.  Additionally, you authorize for your insurance to be billed for the services provided during this telephone visit."   Patient and/or legal guardian consented to telephone visit: Yes   PRESENTING CONCERNS: Patient and/or family reports the following symptoms/concerns: Pt reports diagnosis of depression 2 years ago; however, reports no hx of therapy or medication management. Pt is taking trazedone but reports it is ineffective, she was unable to identify additional coping skills. 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 Family resides with godsister Duration of problem: Ongoing; Severity of problem: moderate  STRENGTHS (Protective Factors/Coping Skills): Receives support from family Pt is in the process of obtaining guardianship and POA of father Pt is open to  medication management  GOALS ADDRESSED: Patient will: 1.  Reduce symptoms of: anxiety, depression and stress  2.  Increase knowledge and/or ability of: coping skills and healthy habits  3.  Demonstrate ability to: Increase healthy adjustment to current life circumstances and Increase adequate support systems for patient/family  INTERVENTIONS: Interventions utilized:  Solution-Focused Strategies, Supportive Counseling and Psychoeducation and/or Health Education Standardized Assessments completed: Not Needed  ASSESSMENT: Patient currently experiencing depression and anxiety triggered by psychosocial stressors.   Patient may benefit from medication management. LCSW discussed how stress can negatively impact health and identified healthy coping skills to assist in management and/or decrease of symptoms. Pt is open to medication management; however, not psychotherapy due to limited time in schedule.   LCSW will mail out information on CAPS program to strengthen pt's support system with caring for father  PLAN: 1. Follow up with behavioral health clinician on : Contact LCSW with any behavioral health or resource needs 2. Behavioral recommendations: Utilize healthy coping skills discussed and follow up with CAPS program 3. Referral(s): Integrated [employer] (In [place]) and Community Resources:  CAPS  Bridgett Larsson, [place] 11/01/2019 12:38 PM

## 2019-10-30 ENCOUNTER — Telehealth: Payer: Self-pay | Admitting: Licensed Clinical Social Worker

## 2019-10-30 NOTE — Telephone Encounter (Signed)
This MSW Intern placed return call to patient regarding patient's voicemail requesting assistance completing guardianship paperwork on behalf of her father who has been diagnosed with dementia. 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 Patient must complete guardianship paperwork by 10/31/19 to receive Section 8 voucher.   This MSW Intern spoke with Vernona Rieger from [employer] and submitted a referral on patient's behalf after receiving verbal consent from patient. Vernona Rieger reports an attorney will be in contact with patient today. This MSW Intern additionally left a message with [employer] of Guardianship [phone]) after receiving verbal consent from patient.   No additional concerns noted.

## 2019-11-01 ENCOUNTER — Encounter (INDEPENDENT_AMBULATORY_CARE_PROVIDER_SITE_OTHER): Payer: Self-pay | Admitting: Primary Care

## 2019-11-01 NOTE — BH Specialist Note (Deleted)
See [facility] note

## 2019-11-08 ENCOUNTER — Telehealth (INDEPENDENT_AMBULATORY_CARE_PROVIDER_SITE_OTHER): Payer: Self-pay

## 2019-11-08 NOTE — Telephone Encounter (Signed)
Open error 

## 2019-11-25 ENCOUNTER — Other Ambulatory Visit (INDEPENDENT_AMBULATORY_CARE_PROVIDER_SITE_OTHER): Payer: Self-pay | Admitting: Primary Care

## 2019-11-28 ENCOUNTER — Other Ambulatory Visit (INDEPENDENT_AMBULATORY_CARE_PROVIDER_SITE_OTHER): Payer: Self-pay | Admitting: Primary Care

## 2019-12-12 ENCOUNTER — Telehealth (INDEPENDENT_AMBULATORY_CARE_PROVIDER_SITE_OTHER): Payer: Medicaid Other | Admitting: Primary Care

## 2019-12-12 ENCOUNTER — Encounter (INDEPENDENT_AMBULATORY_CARE_PROVIDER_SITE_OTHER): Payer: Self-pay | Admitting: Primary Care

## 2019-12-12 ENCOUNTER — Other Ambulatory Visit: Payer: Self-pay

## 2019-12-12 DIAGNOSIS — E782 Mixed hyperlipidemia: Secondary | ICD-10-CM | POA: Diagnosis not present

## 2019-12-12 DIAGNOSIS — I10 Essential (primary) hypertension: Secondary | ICD-10-CM | POA: Diagnosis not present

## 2019-12-12 DIAGNOSIS — Z833 Family history of diabetes mellitus: Secondary | ICD-10-CM

## 2019-12-12 DIAGNOSIS — R5382 Chronic fatigue, unspecified: Secondary | ICD-10-CM | POA: Diagnosis not present

## 2019-12-12 DIAGNOSIS — R202 Paresthesia of skin: Secondary | ICD-10-CM

## 2019-12-12 DIAGNOSIS — E876 Hypokalemia: Secondary | ICD-10-CM | POA: Diagnosis not present

## 2019-12-12 NOTE — Progress Notes (Signed)
Numbness is constant on right foot, continues to swell.  Numbness in left foot is intermittent

## 2019-12-12 NOTE — Progress Notes (Signed)
Virtual Visit via Telephone Note  I connected with Robin Fischer on 12/12/19 at  2:50 PM EDT by telephone and verified that I am speaking with the correct person using two identifiers.   I discussed the limitations, risks, security and privacy concerns of performing an evaluation and management service by telephone and the availability of in person appointments. I also discussed with the patient that there may be a patient responsible charge related to this service. The patient expressed understanding and agreed to proceed.   History of Present Illness: Robin Fischer is a 49 year old African American female having a tele visit for concerns of numbness and tingling in both feet but right is greater than left. She is also concerned with swelling in her feet. She admits to taking her blood pressure medication on a regular bases but not checking her blood pressure. Explained after reviewing labs from 6 months ago would need more recent results before prescribing a diuretic and I could look at her legs on the way to the lab. Patient in agreement. Denies shortness of breath, headaches, chest pain or lower extremity edema  . Past Medical History:  Diagnosis Date  . Asthma   . Bronchitis   . GERD (gastroesophageal reflux disease)   . High cholesterol   . Hypertension   . Pancreatitis   . Tobacco abuse    Current Outpatient Medications on File Prior to Visit  Medication Sig Dispense Refill  . albuterol (VENTOLIN HFA) 108 (90 Base) MCG/ACT inhaler INHALE 2 PUFFS INTO THE LUNGS EVERY 6 (SIX) HOURS AS NEEDED FOR WHEEZING OR SHORTNESS OF BREATH. 8.5 g 1  . amLODipine (NORVASC) 10 MG tablet Take 1 tablet (10 mg total) by mouth daily. 90 tablet 1  . Blood Pressure Monitor KIT 1 Bag by Does not apply route 3 (three) times daily as needed. 1 kit 0  . budesonide (PULMICORT) 180 MCG/ACT inhaler Inhale 2 puffs into the lungs 2 (two) times daily. 1 each 5  . cetirizine (ZYRTEC) 10 MG tablet Take 1 tablet  (10 mg total) by mouth daily. 30 tablet 11  . famotidine (PEPCID) 20 MG tablet Take 1 tablet (20 mg total) by mouth daily. 90 tablet 0  . guaiFENesin (ROBITUSSIN) 100 MG/5ML SOLN Take 5 mLs (100 mg total) by mouth every 4 (four) hours as needed for cough or to loosen phlegm. 236 mL 0  . traZODone (DESYREL) 100 MG tablet Take 0.5 tablets (50 mg total) by mouth at bedtime. 90 tablet 1  . fluticasone (FLONASE) 50 MCG/ACT nasal spray Place 2 sprays into both nostrils daily. (Patient not taking: Reported on 09/26/2019) 16 g 0   No current facility-administered medications on file prior to visit.   Observations/Objective: Review of Systems  Constitutional: Positive for malaise/fatigue.  Neurological: Positive for tingling and sensory change.       Numbness feet    Assessment and Plan: [name] was seen today for edema and numbness.  Diagnoses and all orders for this visit:  Essential hypertension Counseled on blood pressure goal of less than 130/80, low-sodium, DASH diet, medication compliance, 150 minutes of moderate intensity exercise per week. Discussed medication compliance, adverse effects. -     CMP14+EGFR; Future  Hypokalemia -     CMP14+EGFR; Future  Chronic fatigue Cold intolerance rule old theroid disease -     TSH + free T4; Future  Family history of diabetes mellitus -     Hemoglobin A1c; Future  Mixed hyperlipidemia Discussed with pancreatis and  elevated liver enzymes/lipase cholesterol may not be appropriate but life style modification reduction in fatty foods, greasy food , fried foods , organ meats to lower cholesterol. -     Lipid panel; Future  Paresthesia of both feet -     Vitamin B12    Follow Up Instructions:    I discussed the assessment and treatment plan with the patient. The patient was provided an opportunity to ask questions and all were answered. The patient agreed with the plan and demonstrated an understanding of the instructions.   The patient  was advised to call back or seek an in-person evaluation if the symptoms worsen or if the condition fails to improve as anticipated.  I provided 15 minutes of non-face-to-face time during this encounter.   [name], NP

## 2019-12-16 ENCOUNTER — Other Ambulatory Visit (INDEPENDENT_AMBULATORY_CARE_PROVIDER_SITE_OTHER): Payer: Medicaid Other

## 2019-12-24 ENCOUNTER — Other Ambulatory Visit (INDEPENDENT_AMBULATORY_CARE_PROVIDER_SITE_OTHER): Payer: Self-pay | Admitting: Primary Care

## 2019-12-27 ENCOUNTER — Other Ambulatory Visit (INDEPENDENT_AMBULATORY_CARE_PROVIDER_SITE_OTHER): Payer: Medicaid Other

## 2019-12-27 ENCOUNTER — Other Ambulatory Visit: Payer: Self-pay

## 2019-12-27 DIAGNOSIS — I10 Essential (primary) hypertension: Secondary | ICD-10-CM

## 2019-12-27 DIAGNOSIS — Z833 Family history of diabetes mellitus: Secondary | ICD-10-CM

## 2019-12-27 DIAGNOSIS — E876 Hypokalemia: Secondary | ICD-10-CM

## 2019-12-27 DIAGNOSIS — E782 Mixed hyperlipidemia: Secondary | ICD-10-CM

## 2019-12-27 DIAGNOSIS — R5382 Chronic fatigue, unspecified: Secondary | ICD-10-CM

## 2019-12-28 LAB — TSH+FREE T4
Free T4: 1.22 ng/dL (ref 0.82–1.77)
TSH: 0.849 u[IU]/mL (ref 0.450–4.500)

## 2019-12-28 LAB — LIPID PANEL
Chol/HDL Ratio: 5.4 ratio — ABNORMAL HIGH (ref 0.0–4.4)
Cholesterol, Total: 270 mg/dL — ABNORMAL HIGH (ref 100–199)
HDL: 50 mg/dL (ref >39)
LDL Chol Calc (NIH): 181 mg/dL — ABNORMAL HIGH (ref 0–99)
Triglycerides: 207 mg/dL — ABNORMAL HIGH (ref 0–149)
VLDL Cholesterol Cal: 39 mg/dL (ref 5–40)

## 2019-12-28 LAB — HEMOGLOBIN A1C
Est. average glucose Bld gHb Est-mCnc: 97 mg/dL
Hgb A1c MFr Bld: 5 % (ref 4.8–5.6)

## 2019-12-28 LAB — CMP14+EGFR
ALT: 58 IU/L — ABNORMAL HIGH (ref 0–32)
AST: 179 IU/L — ABNORMAL HIGH (ref 0–40)
Albumin/Globulin Ratio: 1.6 (ref 1.2–2.2)
Albumin: 4.5 g/dL (ref 3.8–4.8)
Alkaline Phosphatase: 328 IU/L — ABNORMAL HIGH (ref 39–117)
BUN/Creatinine Ratio: 6 — ABNORMAL LOW (ref 9–23)
BUN: 4 mg/dL — ABNORMAL LOW (ref 6–24)
Bilirubin Total: 0.7 mg/dL (ref 0.0–1.2)
CO2: 25 mmol/L (ref 20–29)
Calcium: 9.7 mg/dL (ref 8.7–10.2)
Chloride: 98 mmol/L (ref 96–106)
Creatinine, Ser: 0.62 mg/dL (ref 0.57–1.00)
GFR calc Af Amer: 122 mL/min/{1.73_m2} (ref >59)
GFR calc non Af Amer: 105 mL/min/{1.73_m2} (ref >59)
Globulin, Total: 2.8 g/dL (ref 1.5–4.5)
Glucose: 96 mg/dL (ref 65–99)
Potassium: 3.7 mmol/L (ref 3.5–5.2)
Sodium: 141 mmol/L (ref 134–144)
Total Protein: 7.3 g/dL (ref 6.0–8.5)

## 2020-01-01 ENCOUNTER — Other Ambulatory Visit (INDEPENDENT_AMBULATORY_CARE_PROVIDER_SITE_OTHER): Payer: Self-pay | Admitting: Primary Care

## 2020-01-01 ENCOUNTER — Telehealth (INDEPENDENT_AMBULATORY_CARE_PROVIDER_SITE_OTHER): Payer: Self-pay

## 2020-01-01 MED ORDER — ATORVASTATIN CALCIUM 40 MG PO TABS: 40.0000 mg | ORAL_TABLET | Freq: Every day | ORAL | 3 refills | Status: DC

## 2020-01-01 NOTE — Telephone Encounter (Signed)
Patient is aware that thyroid is normal, No diabetes. Elevated cholesterol which can lead to stroke and heart attack. Patient advised to decrease fatty food, red meat, cheese and milk( she is lactose intolerant). Eat more veggies and whole grains. Liver enzymes elevated due to alcohol abuse. Advised to stop drinking to prevent further damage to liver. She verbalized understanding. Maryjean Morn, CMA

## 2020-01-01 NOTE — Telephone Encounter (Signed)
-----   Message from Grayce Sessions, NP sent at 01/01/2020 10:04 AM EDT ----- Elevated cholesterol  can lead to heart attack and stroke. Try to decrease your fatty foods, red meat, cheese, milk and increase fiber like whole grains and veggies.   Thyroid is normal No Diabetes . Liver enzymes elevated from alcohol abuse. Recommending stopping to help prevent increase damage

## 2020-01-07 ENCOUNTER — Telehealth (INDEPENDENT_AMBULATORY_CARE_PROVIDER_SITE_OTHER): Payer: Medicaid Other | Admitting: Licensed Clinical Social Worker

## 2020-01-30 ENCOUNTER — Other Ambulatory Visit (INDEPENDENT_AMBULATORY_CARE_PROVIDER_SITE_OTHER): Payer: Self-pay | Admitting: Primary Care

## 2020-02-28 ENCOUNTER — Other Ambulatory Visit (INDEPENDENT_AMBULATORY_CARE_PROVIDER_SITE_OTHER): Payer: Self-pay | Admitting: Primary Care

## 2020-03-23 ENCOUNTER — Other Ambulatory Visit (INDEPENDENT_AMBULATORY_CARE_PROVIDER_SITE_OTHER): Payer: Self-pay | Admitting: Primary Care

## 2020-03-23 NOTE — Telephone Encounter (Signed)
Approved per protocol. Requested Prescriptions  Pending Prescriptions Disp Refills  . famotidine (PEPCID) 20 MG tablet [Pharmacy Med Name: FAMOTIDINE 20 MG TABLET] 90 tablet 0    Sig: TAKE 1 TABLET (20 MG TOTAL) BY MOUTH DAILY.     Gastroenterology:  H2 Antagonists Passed - 03/23/2020  9:33 AM      Passed - Valid encounter within last 12 months    Recent Outpatient Visits          3 months ago Essential hypertension   [facility] Grayce Sessions, NP   5 months ago Hypertension, unspecified type   [facility] Grayce Sessions, NP   1 year ago Need for Tdap vaccination   [facility] Grayce Sessions, NP   2 years ago Hypertension, unspecified type   [facility] Loletta Specter, PA-C   3 years ago Dysuria   [facility] Loletta Specter, PA-C

## 2020-04-03 ENCOUNTER — Other Ambulatory Visit (INDEPENDENT_AMBULATORY_CARE_PROVIDER_SITE_OTHER): Payer: Self-pay | Admitting: Primary Care

## 2020-04-03 NOTE — Telephone Encounter (Signed)
Patient is requesting RF for DAW Proair- she has generic albuterol listed in chart- sent for review

## 2020-05-07 ENCOUNTER — Other Ambulatory Visit: Payer: Self-pay

## 2020-05-07 ENCOUNTER — Encounter (HOSPITAL_COMMUNITY): Payer: Self-pay

## 2020-05-07 ENCOUNTER — Ambulatory Visit (HOSPITAL_COMMUNITY)
Admission: EM | Admit: 2020-05-07 | Discharge: 2020-05-07 | Disposition: A | Discharge status: Home / Self Care | Payer: Medicaid Other | Attending: Family Medicine | Admitting: Family Medicine

## 2020-05-07 DIAGNOSIS — Z7951 Long term (current) use of inhaled steroids: Secondary | ICD-10-CM | POA: Insufficient documentation

## 2020-05-07 DIAGNOSIS — J45909 Unspecified asthma, uncomplicated: Secondary | ICD-10-CM | POA: Insufficient documentation

## 2020-05-07 DIAGNOSIS — B349 Viral infection, unspecified: Secondary | ICD-10-CM | POA: Diagnosis not present

## 2020-05-07 DIAGNOSIS — K859 Acute pancreatitis without necrosis or infection, unspecified: Secondary | ICD-10-CM | POA: Insufficient documentation

## 2020-05-07 DIAGNOSIS — F17200 Nicotine dependence, unspecified, uncomplicated: Secondary | ICD-10-CM | POA: Insufficient documentation

## 2020-05-07 DIAGNOSIS — I10 Essential (primary) hypertension: Secondary | ICD-10-CM | POA: Diagnosis not present

## 2020-05-07 DIAGNOSIS — J0140 Acute pansinusitis, unspecified: Secondary | ICD-10-CM | POA: Insufficient documentation

## 2020-05-07 DIAGNOSIS — E7800 Pure hypercholesterolemia, unspecified: Secondary | ICD-10-CM | POA: Insufficient documentation

## 2020-05-07 DIAGNOSIS — K219 Gastro-esophageal reflux disease without esophagitis: Secondary | ICD-10-CM | POA: Diagnosis not present

## 2020-05-07 DIAGNOSIS — U071 COVID-19: Secondary | ICD-10-CM | POA: Diagnosis present

## 2020-05-07 DIAGNOSIS — Z79899 Other long term (current) drug therapy: Secondary | ICD-10-CM | POA: Diagnosis not present

## 2020-05-07 DIAGNOSIS — Z8249 Family history of ischemic heart disease and other diseases of the circulatory system: Secondary | ICD-10-CM | POA: Diagnosis not present

## 2020-05-07 LAB — SARS CORONAVIRUS 2 (TAT 6-24 HRS): SARS Coronavirus 2: POSITIVE — AB

## 2020-05-07 MED ORDER — AMOXICILLIN-POT CLAVULANATE 875-125 MG PO TABS: 1.0000 | ORAL_TABLET | Freq: Two times a day (BID) | ORAL | 0 refills | Status: AC

## 2020-05-07 MED ORDER — BENZONATATE 100 MG PO CAPS: 100.0000 mg | ORAL_CAPSULE | Freq: Three times a day (TID) | ORAL | 0 refills | Status: DC

## 2020-05-07 MED ORDER — ACETAMINOPHEN 325 MG PO TABS: 650.0000 mg | ORAL_TABLET | Freq: Four times a day (QID) | ORAL | 0 refills | Status: DC | PRN

## 2020-05-07 MED ORDER — FLUTICASONE PROPIONATE 50 MCG/ACT NA SUSP: 1.0000 | Freq: Every day | NASAL | 0 refills | Status: DC

## 2020-05-07 MED ORDER — GUAIFENESIN 100 MG/5ML PO SOLN: 5.0000 mL | ORAL | 0 refills | Status: DC | PRN

## 2020-05-07 NOTE — ED Triage Notes (Signed)
Pt presents with complaints of sinus pressure, runny nose, nasal congestion, and headache since Saturday. Pt has had first vaccine. Daughter is sick as well. Reports taking otc medication, denies relief.

## 2020-05-07 NOTE — ED Provider Notes (Signed)
[place]    CSN: [id number] Arrival date & time: 05/07/20  0160      History   Chief Complaint Chief Complaint  Patient presents with  . Facial Pain    HPI Robin Fischer is a 49 y.o. female.   Patient reports for nasal congestion, sinus pressure and pain and runny nose has been present for over a week.  She reports symptoms initially started with congestion early last week, which is about 10 days ago now.  She reports worsening congestion development of facial pain and pressure.  She reports she started feeling bad about 5 or 6 days ago over the weekend.  She reports she is feeling somewhat overall better today but continues to have facial pressure and pain.  Describes pressure behind the eyes.  She feels like this is causing her to have some blurry vision.  She notes she just had a change in glasses, states that vision looking far away is blurry.  She reports this is happening when she is not wearing her glasses.  She reports body ache and chills.  No fevers taken.  Reports cough at night and needing to clear sputum from her throat in the mornings.  Denies significant sore throat.  Reports no difficulty or shortness of breath.  No chest pain.  Has been using her inhalers per usual.  She reports no change in taste or smell.  Denies dizziness.  Reports a little left ear pain.  Denies change in hearing.  She reports she did a detox which caused some loose stool however that is resolved.  Denies any vomiting or nausea.  Denies belly pain.  She received 1 Covid vaccine dose and is due to receive the second September 8.  Her daughter has similar symptoms recently.     Past Medical History:  Diagnosis Date  . Asthma   . Bronchitis   . GERD (gastroesophageal reflux disease)   . High cholesterol   . Hypertension   . Pancreatitis   . Tobacco abuse     Patient Active Problem List   Diagnosis Date Noted  . Dehydration 05/04/2019  . Pancreatitis 05/04/2019  . Hypokalemia  08/20/2018  . Tobacco abuse 08/20/2018  . Leukocytosis 08/20/2018  . Alcohol abuse 08/20/2018  . Alcoholic pancreatitis 10/93/2355  . Abnormal LFTs 08/20/2018  . Acute pancreatitis 08/20/2018  . Asthma   . Hypertension     History reviewed. No pertinent surgical history.  OB History   No obstetric history on file.      Home Medications    Prior to Admission medications   Medication Sig Start Date End Date Taking? Authorizing Provider  acetaminophen (TYLENOL) 325 MG tablet Take 2 tablets (650 mg total) by mouth every 6 (six) hours as needed. 05/07/20   Jahnay Lantier, Marguerita Beards, PA-C  amLODipine (NORVASC) 10 MG tablet TAKE 1 TABLET (10 MG TOTAL) BY MOUTH DAILY. 02/28/20   [name], NP  amoxicillin-clavulanate (AUGMENTIN) 875-125 MG tablet Take 1 tablet by mouth 2 (two) times daily for 10 days. 05/07/20 05/17/20  Leonidas Boateng, Marguerita Beards, PA-C  atorvastatin (LIPITOR) 40 MG tablet Take 1 tablet (40 mg total) by mouth daily. 01/01/20   [name], NP  benzonatate (TESSALON) 100 MG capsule Take 1 capsule (100 mg total) by mouth every 8 (eight) hours. 05/07/20   Malesha Suliman, Marguerita Beards, PA-C  Blood Pressure Monitor KIT 1 Bag by Does not apply route 3 (three) times daily as needed. 09/26/19   [name], NP  budesonide (PULMICORT) 180 MCG/ACT inhaler Inhale 2 puffs into the lungs 2 (two) times daily. 09/26/19   Edwards, Michelle P, NP  cetirizine (ZYRTEC) 10 MG tablet Take 1 tablet (10 mg total) by mouth daily. 09/26/19   Edwards, Michelle P, NP  famotidine (PEPCID) 20 MG tablet TAKE 1 TABLET (20 MG TOTAL) BY MOUTH DAILY. 03/23/20   Edwards, Michelle P, NP  fluticasone (FLONASE) 50 MCG/ACT nasal spray Place 1 spray into both nostrils daily. 05/07/20   ,  E, PA-C  guaiFENesin (ROBITUSSIN) 100 MG/5ML SOLN Take 5 mLs (100 mg total) by mouth every 4 (four) hours as needed for cough or to loosen phlegm. 05/07/20   ,  E, PA-C  PROAIR HFA 108 (90 Base) MCG/ACT inhaler INHALE 2 PUFFS INTO THE LUNGS  EVERY 6 (SIX) HOURS AS NEEDED FOR WHEEZING OR SHORTNESS OF BREATH. 04/03/20   [name], [name], MD  traZODone (DESYREL) 100 MG tablet Take 0.5 tablets (50 mg total) by mouth at bedtime. 09/26/19   Edwards, Michelle P, NP    Family History Family History  Problem Relation Age of Onset  . Hypertension Mother   . Hyperlipidemia Mother   . Hypertension Father   . Hyperlipidemia Father     Social History Social History   Tobacco Use  . Smoking status: Current Every Day Smoker  . Smokeless tobacco: Never Used  Substance Use Topics  . Alcohol use: Yes    Comment: beer  . Drug use: No     Allergies   Patient has no known allergies.   Review of Systems Review of Systems   Physical Exam Triage Vital Signs ED Triage Vitals  Enc Vitals Group     BP 05/07/20 0923 125/69     Pulse Rate 05/07/20 0923 80     Resp 05/07/20 0923 19     Temp 05/07/20 0923 99.1 F (37.3 C)     Temp src --      SpO2 05/07/20 0923 94 %     Weight --      Height --      Head Circumference --      Peak Flow --      Pain Score 05/07/20 0920 7     Pain Loc --      Pain Edu? --      Excl. in GC? --    No data found.  Updated Vital Signs BP 125/69   Pulse 80   Temp 99.1 F (37.3 C)   Resp 19   SpO2 94%   Visual Acuity Right Eye Distance:   Left Eye Distance:   Bilateral Distance:    Right Eye Near:   Left Eye Near:    Bilateral Near:     Physical Exam Vitals and nursing note reviewed.  Constitutional:      General: She is not in acute distress.    Appearance: Normal appearance. She is well-developed. She is not ill-appearing or diaphoretic.  HENT:     Head: Normocephalic and atraumatic.     Comments: Tenderness of the frontal and maxillary sinuses.    Right Ear: Tympanic membrane, ear canal and external ear normal.     Left Ear: Ear canal and external ear normal.     Ears:     Comments: Slight serous effusion of the left ear.    Nose: Congestion and rhinorrhea present.      Mouth/Throat:     Mouth: Mucous membranes are moist.     Pharynx: No posterior   oropharyngeal erythema.     Comments: Postnasal drip visible Eyes:     General: No scleral icterus.    Extraocular Movements: Extraocular movements intact.     Conjunctiva/sclera: Conjunctivae normal.     Pupils: Pupils are equal, round, and reactive to light.     Comments: No photophobia.  No scleral injection.  Cardiovascular:     Rate and Rhythm: Normal rate and regular rhythm.     Heart sounds: No murmur heard.   Pulmonary:     Effort: Pulmonary effort is normal. No respiratory distress.     Breath sounds: Normal breath sounds. No wheezing, rhonchi or rales.     Comments: Speaking in full sentences.  Moving air well.  Saturating 99 to 100% on this provider exam. Abdominal:     Palpations: Abdomen is soft.     Tenderness: There is no abdominal tenderness.  Musculoskeletal:     Cervical back: Normal range of motion and neck supple.     Right lower leg: No edema.     Left lower leg: No edema.  Lymphadenopathy:     Cervical: No cervical adenopathy.  Skin:    General: Skin is warm and dry.  Neurological:     General: No focal deficit present.     Mental Status: She is alert and oriented to person, place, and time.      UC Treatments / Results  Labs (all labs ordered are listed, but only abnormal results are displayed) Labs Reviewed  SARS CORONAVIRUS 2 (TAT 6-24 HRS)    EKG   Radiology No results found.  Procedures Procedures (including critical care time)  Medications Ordered in UC Medications - No data to display  Initial Impression / Assessment and Plan / UC Course  I have reviewed the triage vital signs and the nursing notes.  Pertinent labs & imaging results that were available during my care of the patient were reviewed by me and considered in my medical decision making (see chart for details).     #Viral illness #Acute sinusitis Patient is a 49-year-old presenting with  peers and may began as a viral illness but is developed an acute sinusitis.  Well-appearing with normal vital signs here.  Given 10 days or greater symptoms of congestion with worsening facial pain will start Augmentin.  Flonase, nasal saline and Zyrtec recommended.  Will give cough medicines.  Covid sent.  Encouraged hydration.  Encouraged follow-up with primary care.  Discussed return, follow-up and emergency department precautions.  Patient verbalized agreement understanding plan of care Final Clinical Impressions(s) / UC Diagnoses   Final diagnoses:  Viral illness  Acute pansinusitis, recurrence not specified     Discharge Instructions     Take medications as prescribed -Augmentin twice a day for 10 days -Flonase daily as per -Continue Zyrtec at home -Use benzonatate/Tessalon for cough as needed -Use Robitussin as -Hydrate well  If severe symptoms such as high fever, shortness of breath, chest pain severe abdominal pain, worse headache of your life go to the emergency department  Follow-up with your primary care next week  If your Covid-19 test is positive, you will receive a phone call from [facility] regarding your results. Negative test results are not called. Both positive and negative results area always visible on MyChart. If you do not have a MyChart account, sign up instructions are in your discharge papers.   Persons who are directed to care for themselves at home may discontinue isolation under the following conditions:  .   At least 10 days have passed since symptom onset and . At least 24 hours have passed without running a fever (this means without the use of fever-reducing medications) and . Other symptoms have improved.  Persons infected with COVID-19 who never develop symptoms may discontinue isolation and other precautions 10 days after the date of their first positive COVID-19 test.       ED Prescriptions    Medication Sig Dispense Auth. Provider    fluticasone (FLONASE) 50 MCG/ACT nasal spray Place 1 spray into both nostrils daily. 16 g Therin Vetsch, Marguerita Beards, PA-C   amoxicillin-clavulanate (AUGMENTIN) 875-125 MG tablet Take 1 tablet by mouth 2 (two) times daily for 10 days. 20 tablet Ovidio Steele, Marguerita Beards, PA-C   benzonatate (TESSALON) 100 MG capsule Take 1 capsule (100 mg total) by mouth every 8 (eight) hours. 21 capsule Carlin Attridge, Marguerita Beards, PA-C   guaiFENesin (ROBITUSSIN) 100 MG/5ML SOLN Take 5 mLs (100 mg total) by mouth every 4 (four) hours as needed for cough or to loosen phlegm. 236 mL Kamonte Mcmichen, Marguerita Beards, PA-C   acetaminophen (TYLENOL) 325 MG tablet Take 2 tablets (650 mg total) by mouth every 6 (six) hours as needed. 30 tablet Lauriann Milillo, Marguerita Beards, PA-C     PDMP not reviewed this encounter.   Purnell Shoemaker, PA-C 05/07/20 1122

## 2020-05-07 NOTE — Discharge Instructions (Signed)
Take medications as prescribed -Augmentin twice a day for 10 days -Flonase daily as per -Continue Zyrtec at home -Use benzonatate/Tessalon for cough as needed -Use Robitussin as -Hydrate well  If severe symptoms such as high fever, shortness of breath, chest pain severe abdominal pain, worse headache of your life go to the emergency department  Follow-up with your primary care next week  If your Covid-19 test is positive, you will receive a phone call from [facility] regarding your results. Negative test results are not called. Both positive and negative results area always visible on MyChart. If you do not have a MyChart account, sign up instructions are in your discharge papers.   Persons who are directed to care for themselves at home may discontinue isolation under the following conditions:   At least 10 days have passed since symptom onset and  At least 24 hours have passed without running a fever (this means without the use of fever-reducing medications) and  Other symptoms have improved.  Persons infected with COVID-19 who never develop symptoms may discontinue isolation and other precautions 10 days after the date of their first positive COVID-19 test.

## 2020-05-22 ENCOUNTER — Other Ambulatory Visit (INDEPENDENT_AMBULATORY_CARE_PROVIDER_SITE_OTHER): Payer: Self-pay | Admitting: Primary Care

## 2020-07-02 ENCOUNTER — Other Ambulatory Visit (INDEPENDENT_AMBULATORY_CARE_PROVIDER_SITE_OTHER): Payer: Self-pay | Admitting: Family Medicine

## 2020-07-02 ENCOUNTER — Other Ambulatory Visit (INDEPENDENT_AMBULATORY_CARE_PROVIDER_SITE_OTHER): Payer: Self-pay | Admitting: Primary Care

## 2020-07-02 NOTE — Telephone Encounter (Signed)
Requested Prescriptions  Pending Prescriptions Disp Refills  . amLODipine (NORVASC) 10 MG tablet [Pharmacy Med Name: AMLODIPINE BESYLATE 10MG  TABLET] 30 tablet 0    Sig: TAKE 1 (ONE) TABLET (10 MG TOTAL) BY MOUTH DAILY.     Cardiovascular:  Calcium Channel Blockers Failed - 07/02/2020  9:28 AM      Failed - Valid encounter within last 6 months    Recent Outpatient Visits          6 months ago Essential hypertension   [facility] 07/04/2020, NP   9 months ago Hypertension, unspecified type   [facility], NP   1 year ago Need for Tdap vaccination   [facility], NP   2 years ago Hypertension, unspecified type   [facility], PA-C   3 years ago Dysuria   [facility], PA-C      Future Appointments            In 4 days Loletta Specter, NP [facility]           Passed - Last BP in normal range    BP Readings from Last 1 Encounters:  05/07/20 125/69         called and schedule appointment.  Gave 30 day courtesy refill

## 2020-07-02 NOTE — Telephone Encounter (Signed)
Requested Prescriptions  Pending Prescriptions Disp Refills   PROAIR HFA 108 (90 Base) MCG/ACT inhaler [Pharmacy Med Name: PROAIR HFA 90 MCG/INH AEROSOL] 8.5 g 0    Sig: INHALE 2 PUFFS INTO THE LUNGS EVERY 6 (SIX) HOURS AS NEEDED FOR WHEEZING OR SHORTNESS OF BREATH.     Pulmonology:  Beta Agonists Failed - 07/02/2020  9:28 AM      Failed - One inhaler should last at least one month. If the patient is requesting refills earlier, contact the patient to check for uncontrolled symptoms.      Passed - Valid encounter within last 12 months    Recent Outpatient Visits          6 months ago Essential hypertension   [facility] Grayce Sessions, NP   9 months ago Hypertension, unspecified type   [facility] Grayce Sessions, NP   1 year ago Need for Tdap vaccination   [facility] Grayce Sessions, NP   2 years ago Hypertension, unspecified type   [facility] Loletta Specter, PA-C   3 years ago Dysuria   [facility] Loletta Specter, PA-C      Future Appointments            In 4 days Grayce Sessions, NP [facility]

## 2020-07-06 ENCOUNTER — Ambulatory Visit (INDEPENDENT_AMBULATORY_CARE_PROVIDER_SITE_OTHER): Payer: Medicaid Other | Admitting: Primary Care

## 2020-07-27 ENCOUNTER — Encounter (HOSPITAL_COMMUNITY): Payer: Self-pay | Admitting: Emergency Medicine

## 2020-07-27 ENCOUNTER — Emergency Department (HOSPITAL_COMMUNITY): Payer: Medicaid Other

## 2020-07-27 ENCOUNTER — Observation Stay (HOSPITAL_COMMUNITY)
Admission: EM | Admit: 2020-07-27 | Discharge: 2020-07-28 | Disposition: A | Discharge status: Home / Self Care | Payer: Medicaid Other | Attending: Family Medicine | Admitting: Family Medicine

## 2020-07-27 DIAGNOSIS — B9689 Other specified bacterial agents as the cause of diseases classified elsewhere: Secondary | ICD-10-CM | POA: Diagnosis not present

## 2020-07-27 DIAGNOSIS — J0300 Acute streptococcal tonsillitis, unspecified: Principal | ICD-10-CM | POA: Insufficient documentation

## 2020-07-27 DIAGNOSIS — Z79899 Other long term (current) drug therapy: Secondary | ICD-10-CM | POA: Insufficient documentation

## 2020-07-27 DIAGNOSIS — D72829 Elevated white blood cell count, unspecified: Secondary | ICD-10-CM | POA: Diagnosis present

## 2020-07-27 DIAGNOSIS — Z20822 Contact with and (suspected) exposure to covid-19: Secondary | ICD-10-CM | POA: Diagnosis not present

## 2020-07-27 DIAGNOSIS — N3001 Acute cystitis with hematuria: Secondary | ICD-10-CM | POA: Diagnosis not present

## 2020-07-27 DIAGNOSIS — J45909 Unspecified asthma, uncomplicated: Secondary | ICD-10-CM | POA: Diagnosis not present

## 2020-07-27 DIAGNOSIS — Z720 Tobacco use: Secondary | ICD-10-CM | POA: Diagnosis present

## 2020-07-27 DIAGNOSIS — F17200 Nicotine dependence, unspecified, uncomplicated: Secondary | ICD-10-CM | POA: Diagnosis not present

## 2020-07-27 DIAGNOSIS — J029 Acute pharyngitis, unspecified: Secondary | ICD-10-CM | POA: Diagnosis present

## 2020-07-27 DIAGNOSIS — N390 Urinary tract infection, site not specified: Secondary | ICD-10-CM

## 2020-07-27 DIAGNOSIS — I10 Essential (primary) hypertension: Secondary | ICD-10-CM | POA: Insufficient documentation

## 2020-07-27 LAB — URINALYSIS, ROUTINE W REFLEX MICROSCOPIC
Glucose, UA: NEGATIVE mg/dL
Ketones, ur: 20 mg/dL — AB
Nitrite: NEGATIVE
Protein, ur: 100 mg/dL — AB
Specific Gravity, Urine: 1.029 (ref 1.005–1.030)
WBC, UA: >50 WBC/hpf — ABNORMAL HIGH (ref 0–5)
pH: 5 (ref 5.0–8.0)

## 2020-07-27 LAB — CBC WITH DIFFERENTIAL/PLATELET
Abs Immature Granulocytes: 0.12 10*3/uL — ABNORMAL HIGH (ref 0.00–0.07)
Basophils Absolute: 0.1 10*3/uL (ref 0.0–0.1)
Basophils Relative: 0 %
Eosinophils Absolute: 0.1 10*3/uL (ref 0.0–0.5)
Eosinophils Relative: 0 %
HCT: 44 % (ref 36.0–46.0)
Hemoglobin: 15.3 g/dL — ABNORMAL HIGH (ref 12.0–15.0)
Immature Granulocytes: 1 %
Lymphocytes Relative: 17 %
Lymphs Abs: 4.2 10*3/uL — ABNORMAL HIGH (ref 0.7–4.0)
MCH: 29.6 pg (ref 26.0–34.0)
MCHC: 34.8 g/dL (ref 30.0–36.0)
MCV: 85.1 fL (ref 80.0–100.0)
Monocytes Absolute: 1.5 10*3/uL — ABNORMAL HIGH (ref 0.1–1.0)
Monocytes Relative: 6 %
Neutro Abs: 18 10*3/uL — ABNORMAL HIGH (ref 1.7–7.7)
Neutrophils Relative %: 76 %
Platelets: 327 10*3/uL (ref 150–400)
RBC: 5.17 MIL/uL — ABNORMAL HIGH (ref 3.87–5.11)
RDW: 14.3 % (ref 11.5–15.5)
WBC: 23.9 10*3/uL — ABNORMAL HIGH (ref 4.0–10.5)
nRBC: 0 % (ref 0.0–0.2)

## 2020-07-27 LAB — BASIC METABOLIC PANEL
Anion gap: 12 (ref 5–15)
BUN: 14 mg/dL (ref 6–20)
CO2: 30 mmol/L (ref 22–32)
Calcium: 9.4 mg/dL (ref 8.9–10.3)
Chloride: 96 mmol/L — ABNORMAL LOW (ref 98–111)
Creatinine, Ser: 0.76 mg/dL (ref 0.44–1.00)
GFR, Estimated: >60 mL/min (ref >60)
Glucose, Bld: 96 mg/dL (ref 70–99)
Potassium: 3.1 mmol/L — ABNORMAL LOW (ref 3.5–5.1)
Sodium: 138 mmol/L (ref 135–145)

## 2020-07-27 LAB — GROUP A STREP BY PCR: Group A Strep by PCR: DETECTED — AB

## 2020-07-27 MED ORDER — DEXAMETHASONE SODIUM PHOSPHATE 10 MG/ML IJ SOLN
10.0000 mg | Freq: Once | INTRAMUSCULAR | Status: AC
  Administered 2020-07-27: 10 mg via INTRAVENOUS
  Filled 2020-07-27: qty 1

## 2020-07-27 MED ORDER — SODIUM CHLORIDE 0.9 % IV BOLUS
1000.0000 mL | Freq: Once | INTRAVENOUS | Status: AC
  Administered 2020-07-27: 1000 mL via INTRAVENOUS

## 2020-07-27 MED ORDER — SODIUM CHLORIDE 0.9 % IV SOLN
3.0000 g | Freq: Once | INTRAVENOUS | Status: AC
  Administered 2020-07-27: 3 g via INTRAVENOUS
  Filled 2020-07-27: qty 3

## 2020-07-27 MED ORDER — OXYCODONE-ACETAMINOPHEN 5-325 MG PO TABS
1.0000 | ORAL_TABLET | Freq: Once | ORAL | Status: AC
  Administered 2020-07-27: 1 via ORAL
  Filled 2020-07-27: qty 1

## 2020-07-27 MED ORDER — IOHEXOL 300 MG/ML  SOLN
75.0000 mL | Freq: Once | INTRAMUSCULAR | Status: AC | PRN
  Administered 2020-07-27: 75 mL via INTRAVENOUS

## 2020-07-27 NOTE — ED Provider Notes (Signed)
[place] EMERGENCY DEPARTMENT Provider Note  CSN: [id number] Arrival date & time: 07/27/20 1215    History Chief Complaint  Patient presents with  . Sore Throat  . Dysuria    HPI  Robin Fischer is a 49 y.o. female with history of HTN reports 2 days of worsening sore throat, worse with swallowing, associated with drooling at night, spitting her secretions during the day and a nosebleed 2 days ago. She has felt hot but has not taken her temperature. She feels like her lymph nodes are swollen. She has a secondary complaint of dysuria and hematuria.   Past Medical History:  Diagnosis Date  . Asthma   . Bronchitis   . GERD (gastroesophageal reflux disease)   . High cholesterol   . Hypertension   . Pancreatitis   . Tobacco abuse     History reviewed. No pertinent surgical history.  Family History  Problem Relation Age of Onset  . Hypertension Mother   . Hyperlipidemia Mother   . Hypertension Father   . Hyperlipidemia Father     Social History   Tobacco Use  . Smoking status: Current Every Day Smoker  . Smokeless tobacco: Never Used  Substance Use Topics  . Alcohol use: Yes    Comment: beer  . Drug use: No     Home Medications Prior to Admission medications   Medication Sig Start Date End Date Taking? Authorizing Provider  acetaminophen (TYLENOL) 325 MG tablet Take 2 tablets (650 mg total) by mouth every 6 (six) hours as needed. 05/07/20   [name], [name], PA-C  amLODipine (NORVASC) 10 MG tablet TAKE 1 (ONE) TABLET (10 MG TOTAL) BY MOUTH DAILY. 07/02/20   [name], NP  atorvastatin (LIPITOR) 40 MG tablet Take 1 tablet (40 mg total) by mouth daily. 01/01/20   [name], NP  benzonatate (TESSALON) 100 MG capsule Take 1 capsule (100 mg total) by mouth every 8 (eight) hours. 05/07/20   [name], [name], PA-C  Blood Pressure Monitor KIT 1 Bag by Does not apply route 3 (three) times daily as needed. 09/26/19   [name], NP  budesonide (PULMICORT)  180 MCG/ACT inhaler Inhale 2 puffs into the lungs 2 (two) times daily. 09/26/19   [name], NP  cetirizine (ZYRTEC) 10 MG tablet Take 1 tablet (10 mg total) by mouth daily. 09/26/19   [name], NP  famotidine (PEPCID) 20 MG tablet TAKE 1 TABLET (20 MG TOTAL) BY MOUTH DAILY. 03/23/20   [name], NP  fluticasone (FLONASE) 50 MCG/ACT nasal spray Place 1 spray into both nostrils daily. 05/07/20   [name], [name], PA-C  guaiFENesin (ROBITUSSIN) 100 MG/5ML SOLN Take 5 mLs (100 mg total) by mouth every 4 (four) hours as needed for cough or to loosen phlegm. 05/07/20   [name], [name], PA-C  PROAIR HFA 108 (90 Base) MCG/ACT inhaler INHALE 2 PUFFS INTO THE LUNGS EVERY 6 (SIX) HOURS AS NEEDED FOR WHEEZING OR SHORTNESS OF BREATH. 07/02/20   Charlott Rakes, MD  traZODone (DESYREL) 100 MG tablet TAKE ONE TABLET BY MOUTH DAILY AT BEDTIME. 05/22/20   [name], NP     Allergies    Patient has no known allergies.   Review of Systems   Review of Systems A comprehensive review of systems was completed and negative except as noted in HPI.    Physical Exam BP 134/74 (BP Location: Left Arm)   Pulse 97   Temp 99.6 F (37.6 C) (Oral)  Resp 18   Ht 5' 8" (1.727 m)   Wt 74.8 kg   SpO2 93%   BMI 25.09 kg/m   Physical Exam Vitals and nursing note reviewed.  Constitutional:      Appearance: Normal appearance.  HENT:     Head: Normocephalic and atraumatic.     Nose: Nose normal.     Mouth/Throat:     Mouth: Mucous membranes are moist.     Comments: Moderate trismus, bilateral tonsillar pillar edema L>R, hot potato voice Eyes:     Extraocular Movements: Extraocular movements intact.     Conjunctiva/sclera: Conjunctivae normal.  Cardiovascular:     Rate and Rhythm: Normal rate.  Pulmonary:     Effort: Pulmonary effort is normal.     Breath sounds: Normal breath sounds.  Abdominal:     General: Abdomen is flat.     Palpations: Abdomen is soft.     Tenderness: There is  no abdominal tenderness.  Musculoskeletal:        General: No swelling. Normal range of motion.     Cervical back: Neck supple.  Lymphadenopathy:     Cervical: Cervical adenopathy present.  Skin:    General: Skin is warm and dry.  Neurological:     General: No focal deficit present.     Mental Status: She is alert.  Psychiatric:        Mood and Affect: Mood normal.      ED Results / Procedures / Treatments   Labs (all labs ordered are listed, but only abnormal results are displayed) Labs Reviewed  GROUP A STREP BY PCR - Abnormal; Notable for the following components:      Result Value   Group A Strep by PCR DETECTED (*)    All other components within normal limits  URINALYSIS, ROUTINE W REFLEX MICROSCOPIC - Abnormal; Notable for the following components:   Color, Urine AMBER (*)    APPearance CLOUDY (*)    Hgb urine dipstick MODERATE (*)    Bilirubin Urine SMALL (*)    Ketones, ur 20 (*)    Protein, ur 100 (*)    Leukocytes,Ua MODERATE (*)    WBC, UA >50 (*)    Bacteria, UA FEW (*)    All other components within normal limits  BASIC METABOLIC PANEL - Abnormal; Notable for the following components:   Potassium 3.1 (*)    Chloride 96 (*)    All other components within normal limits  CBC WITH DIFFERENTIAL/PLATELET - Abnormal; Notable for the following components:   WBC 23.9 (*)    RBC 5.17 (*)    Hemoglobin 15.3 (*)    Neutro Abs 18.0 (*)    Lymphs Abs 4.2 (*)    Monocytes Absolute 1.5 (*)    Abs Immature Granulocytes 0.12 (*)    All other components within normal limits  RESPIRATORY PANEL BY RT PCR (FLU A&B, COVID)    EKG None  Radiology CT Soft Tissue Neck W Contrast  Result Date: 07/27/2020 CLINICAL DATA:  Throat pain with difficulty swallowing EXAM: CT NECK WITH CONTRAST TECHNIQUE: Multidetector CT imaging of the neck was performed using the standard protocol following the bolus administration of intravenous contrast. CONTRAST:  69m OMNIPAQUE IOHEXOL 300  MG/ML  SOLN COMPARISON:  11/24/2017 FINDINGS: PHARYNX AND LARYNX: The nasopharynx is normal. The palatine tonsils are markedly enlarged. No peritonsillar or retropharyngeal fluid collection. The lingual tonsils are also enlarged. Normal larynx and epiglottis. SALIVARY GLANDS: Normal parotid, submandibular and sublingual glands. THYROID:  Normal. LYMPH NODES: Right-greater-than-left reactive cervical lymphadenopathy. VASCULAR: Major cervical vessels are patent. LIMITED INTRACRANIAL: Normal. VISUALIZED ORBITS: None MASTOIDS AND VISUALIZED PARANASAL SINUSES: No fluid levels or advanced mucosal thickening. No mastoid effusion. SKELETON: No bony spinal canal stenosis. No lytic or blastic lesions. UPPER CHEST: Clear. OTHER: None. IMPRESSION: 1. Markedly enlarged palatine and lingual tonsils, consistent with acute tonsillopharyngitis. No peritonsillar or retropharyngeal abscess. 2. Reactive cervical lymphadenopathy. Electronically Signed   By: Ulyses Jarred M.D.   On: 07/27/2020 23:22    Procedures Procedures  Medications Ordered in the ED Medications  oxyCODONE-acetaminophen (PERCOCET/ROXICET) 5-325 MG per tablet 1 tablet (1 tablet Oral Given 07/27/20 1726)  sodium chloride 0.9 % bolus 1,000 mL (1,000 mLs Intravenous New Bag/Given 07/27/20 2215)  dexamethasone (DECADRON) injection 10 mg (10 mg Intravenous Given 07/27/20 2215)  Ampicillin-Sulbactam (UNASYN) 3 g in sodium chloride 0.9 % 100 mL IVPB (3 g Intravenous New Bag/Given 07/27/20 2320)  iohexol (OMNIPAQUE) 300 MG/ML solution 75 mL (75 mLs Intravenous Contrast Given 07/27/20 2258)     MDM Rules/Calculators/A&P MDM Patient's exam is concerning for peritonsillar abscess but visualization is difficult due to trismus. Will send for CT, check labs and give steroids, IVF and Unasyn. She was given percocet in the waiting room. UA also concerning for infection, unasyn should cover for now.  ED Course  I have reviewed the triage vital signs and the nursing  notes.  Pertinent labs & imaging results that were available during my care of the patient were reviewed by me and considered in my medical decision making (see chart for details).  Clinical Course as of Jul 28 12  Mon Jul 27, 2020  2248 Strep is positive. BMP is normal.    [CS]  1443 WBC markedly elevated, consist with tonsillitis/abscess.    [CS]  2355 CT images and results reviewed, markedly enlarged tonsils but no signs of drainable abscess. Given severity of symptoms and tonsil size, will plan admission for aggressive treatment. Hospitalist paged.    [CS]  Tue Jul 28, 2020  0013 Spoke with Dr. Trilby Drummer, Hospitalist, who will evaluate for admission.    [CS]    Clinical Course User Index [CS] Truddie Hidden, MD    Final Clinical Impression(s) / ED Diagnoses Final diagnoses:  Strep tonsillitis  Acute cystitis with hematuria    Rx / DC Orders ED Discharge Orders    None       Truddie Hidden, MD 07/28/20 [phone]

## 2020-07-27 NOTE — ED Triage Notes (Addendum)
Pt arrives via [facility]. Pt reports throat pain and difficulty swallowing. Also endorses burning with urination.

## 2020-07-27 NOTE — ED Notes (Signed)
Pt. Requesting something for pain.

## 2020-07-28 ENCOUNTER — Other Ambulatory Visit: Payer: Self-pay

## 2020-07-28 DIAGNOSIS — J029 Acute pharyngitis, unspecified: Secondary | ICD-10-CM | POA: Diagnosis present

## 2020-07-28 DIAGNOSIS — N390 Urinary tract infection, site not specified: Secondary | ICD-10-CM

## 2020-07-28 LAB — BASIC METABOLIC PANEL
Anion gap: 13 (ref 5–15)
Anion gap: 14 (ref 5–15)
BUN: 11 mg/dL (ref 6–20)
BUN: 7 mg/dL (ref 6–20)
CO2: 25 mmol/L (ref 22–32)
CO2: 27 mmol/L (ref 22–32)
Calcium: 9.1 mg/dL (ref 8.9–10.3)
Calcium: 9.4 mg/dL (ref 8.9–10.3)
Chloride: 96 mmol/L — ABNORMAL LOW (ref 98–111)
Chloride: 95 mmol/L — ABNORMAL LOW (ref 98–111)
Creatinine, Ser: 0.63 mg/dL (ref 0.44–1.00)
Creatinine, Ser: 0.68 mg/dL (ref 0.44–1.00)
GFR, Estimated: >60 mL/min (ref >60)
GFR, Estimated: >60 mL/min (ref >60)
Glucose, Bld: 161 mg/dL — ABNORMAL HIGH (ref 70–99)
Glucose, Bld: 191 mg/dL — ABNORMAL HIGH (ref 70–99)
Potassium: 5.4 mmol/L — ABNORMAL HIGH (ref 3.5–5.1)
Potassium: 3.7 mmol/L (ref 3.5–5.1)
Sodium: 134 mmol/L — ABNORMAL LOW (ref 135–145)
Sodium: 136 mmol/L (ref 135–145)

## 2020-07-28 LAB — HIV ANTIBODY (ROUTINE TESTING W REFLEX): HIV Screen 4th Generation wRfx: NONREACTIVE

## 2020-07-28 LAB — CBC
HCT: 40.5 % (ref 36.0–46.0)
Hemoglobin: 14.4 g/dL (ref 12.0–15.0)
MCH: 30.3 pg (ref 26.0–34.0)
MCHC: 35.6 g/dL (ref 30.0–36.0)
MCV: 85.3 fL (ref 80.0–100.0)
Platelets: 344 10*3/uL (ref 150–400)
RBC: 4.75 MIL/uL (ref 3.87–5.11)
RDW: 15.3 % (ref 11.5–15.5)
WBC: 20.7 10*3/uL — ABNORMAL HIGH (ref 4.0–10.5)
nRBC: 0 % (ref 0.0–0.2)

## 2020-07-28 LAB — RESPIRATORY PANEL BY RT PCR (FLU A&B, COVID)
Influenza A by PCR: NEGATIVE
Influenza B by PCR: NEGATIVE
SARS Coronavirus 2 by RT PCR: NEGATIVE

## 2020-07-28 MED ORDER — BUDESONIDE 0.5 MG/2ML IN SUSP
0.5000 mg | Freq: Two times a day (BID) | RESPIRATORY_TRACT | Status: DC
  Administered 2020-07-28 (×2): 0.5 mg via RESPIRATORY_TRACT
  Filled 2020-07-28 (×4): qty 2

## 2020-07-28 MED ORDER — NICOTINE 14 MG/24HR TD PT24: 14.0000 mg | MEDICATED_PATCH | Freq: Every day | TRANSDERMAL | Status: DC

## 2020-07-28 MED ORDER — TRAZODONE HCL 100 MG PO TABS
100.0000 mg | ORAL_TABLET | Freq: Every day | ORAL | Status: DC
  Filled 2020-07-28: qty 1

## 2020-07-28 MED ORDER — ENOXAPARIN SODIUM 40 MG/0.4ML ~~LOC~~ SOLN
40.0000 mg | Freq: Every day | SUBCUTANEOUS | Status: DC
  Administered 2020-07-28: 40 mg via SUBCUTANEOUS
  Filled 2020-07-28 (×2): qty 0.4

## 2020-07-28 MED ORDER — FAMOTIDINE 20 MG PO TABS
20.0000 mg | ORAL_TABLET | Freq: Every day | ORAL | Status: DC
  Administered 2020-07-28: 20 mg via ORAL
  Filled 2020-07-28: qty 1

## 2020-07-28 MED ORDER — ACETAMINOPHEN 325 MG PO TABS
650.0000 mg | ORAL_TABLET | Freq: Four times a day (QID) | ORAL | Status: DC | PRN
  Administered 2020-07-28: 650 mg via ORAL
  Filled 2020-07-28: qty 2

## 2020-07-28 MED ORDER — ACETAMINOPHEN 650 MG RE SUPP: 650.0000 mg | Freq: Four times a day (QID) | RECTAL | Status: DC | PRN

## 2020-07-28 MED ORDER — SODIUM CHLORIDE 0.9% FLUSH
3.0000 mL | Freq: Two times a day (BID) | INTRAVENOUS | Status: DC
  Administered 2020-07-28 (×3): 3 mL via INTRAVENOUS

## 2020-07-28 MED ORDER — FLUTICASONE PROPIONATE 50 MCG/ACT NA SUSP
1.0000 | Freq: Every day | NASAL | Status: DC
  Administered 2020-07-28: 1 via NASAL
  Filled 2020-07-28: qty 16

## 2020-07-28 MED ORDER — SODIUM CHLORIDE 0.9 % IV SOLN
2.0000 g | Freq: Every day | INTRAVENOUS | Status: DC
  Administered 2020-07-28 (×2): 2 g via INTRAVENOUS
  Filled 2020-07-28 (×2): qty 2

## 2020-07-28 MED ORDER — ATORVASTATIN CALCIUM 40 MG PO TABS
40.0000 mg | ORAL_TABLET | Freq: Every day | ORAL | Status: DC
  Administered 2020-07-28: 40 mg via ORAL
  Filled 2020-07-28: qty 1

## 2020-07-28 MED ORDER — NICOTINE 14 MG/24HR TD PT24
14.0000 mg | MEDICATED_PATCH | Freq: Every day | TRANSDERMAL | Status: DC
  Administered 2020-07-28: 14 mg via TRANSDERMAL
  Filled 2020-07-28 (×2): qty 1

## 2020-07-28 MED ORDER — AMLODIPINE BESYLATE 10 MG PO TABS
10.0000 mg | ORAL_TABLET | Freq: Every day | ORAL | Status: DC
  Administered 2020-07-28: 10 mg via ORAL
  Filled 2020-07-28: qty 1

## 2020-07-28 MED ORDER — ALBUTEROL SULFATE HFA 108 (90 BASE) MCG/ACT IN AERS
2.0000 | INHALATION_SPRAY | RESPIRATORY_TRACT | Status: DC | PRN
  Filled 2020-07-28: qty 6.7

## 2020-07-28 NOTE — H&P (Signed)
History and Physical   Robin Fischer JSE:[id number] DOB: [birth year] DOA: 07/27/2020  PCP: [name], NP   Patient coming from: Home  Chief Complaint: Dysuria, throat pain  HPI: Robin Fischer is a 49 y.o. female with medical history significant of asthma, hypertension, pancreatitis, alcohol use, tobacco use presents with 3 days Of throat pain and dysuria.  Patient states that she awoke early in the morning on Saturday and noted that she was congested and when she blew her nose she noticed some blood.  She then realized when she tried to swallow that she had a sore throat.  Later that day she noticed that she was getting have some dysuria.  Symptoms did not improve and she has had difficulty swallowing for several days leading to decreased p.o. intake causing her to treat further care.  She endorsed some hot sweats but denies fever, constipation, abdominal pain, chest pain, shortness of breath.  She states that she has not been out much but she may have been exposed through her child who goes to school.  Does report urinary frequency  ED Course: Vital signs in ED notable for hypertension up to 073 systolic.  Lab work notable for potassium of 3.1, hemoglobin of 15.3, leukocytosis to 23.  CT soft tissue neck showed markedly enlarged Palatino lingual tonsils consistent with tonsillopharyngitis.  Patient's strep test was positive in ED.  Urinalysis consistent with UTI.  Review of Systems: As per HPI otherwise all other systems reviewed and are negative.  Past Medical History:  Diagnosis Date  . Asthma   . Bronchitis   . GERD (gastroesophageal reflux disease)   . High cholesterol   . Hypertension   . Pancreatitis   . Tobacco abuse     History reviewed. No pertinent surgical history.  Social History  reports that she has been smoking. She has never used smokeless tobacco. She reports current alcohol use. She reports that she does not use drugs.  No Known Allergies  Family  History  Problem Relation Age of Onset  . Hypertension Mother   . Hyperlipidemia Mother   . Hypertension Father   . Hyperlipidemia Father   Review admission  Prior to Admission medications   Medication Sig Start Date End Date Taking? Authorizing Provider  acetaminophen (TYLENOL) 325 MG tablet Take 2 tablets (650 mg total) by mouth every 6 (six) hours as needed. 05/07/20   [name], [name], PA-C  amLODipine (NORVASC) 10 MG tablet TAKE 1 (ONE) TABLET (10 MG TOTAL) BY MOUTH DAILY. 07/02/20   [name], NP  atorvastatin (LIPITOR) 40 MG tablet Take 1 tablet (40 mg total) by mouth daily. 01/01/20   [name], NP  benzonatate (TESSALON) 100 MG capsule Take 1 capsule (100 mg total) by mouth every 8 (eight) hours. 05/07/20   [name], [name], PA-C  Blood Pressure Monitor KIT 1 Bag by Does not apply route 3 (three) times daily as needed. 09/26/19   [name], NP  budesonide (PULMICORT) 180 MCG/ACT inhaler Inhale 2 puffs into the lungs 2 (two) times daily. 09/26/19   [name], NP  cetirizine (ZYRTEC) 10 MG tablet Take 1 tablet (10 mg total) by mouth daily. 09/26/19   [name], NP  famotidine (PEPCID) 20 MG tablet TAKE 1 TABLET (20 MG TOTAL) BY MOUTH DAILY. 03/23/20   [name], NP  fluticasone (FLONASE) 50 MCG/ACT nasal spray Place 1 spray into both nostrils daily. 05/07/20   [name], [name], PA-C  guaiFENesin (ROBITUSSIN) 100 MG/5ML SOLN  Take 5 mLs (100 mg total) by mouth every 4 (four) hours as needed for cough or to loosen phlegm. 05/07/20   [name], [name], PA-C  PROAIR HFA 108 (90 Base) MCG/ACT inhaler INHALE 2 PUFFS INTO THE LUNGS EVERY 6 (SIX) HOURS AS NEEDED FOR WHEEZING OR SHORTNESS OF BREATH. 07/02/20   Charlott Rakes, MD  traZODone (DESYREL) 100 MG tablet TAKE ONE TABLET BY MOUTH DAILY AT BEDTIME. 05/22/20   [name], NP    Physical Exam: Vitals:   07/27/20 2225 07/28/20 0028 07/28/20 0030 07/28/20 0045  BP:  (!) 180/70 (!) 171/64 (!) 179/76    Pulse: 97 (!) 102 97 99  Resp:  _0 Temp:    98 F (36.7 C)  TempSrc:    Oral  SpO2: 93% 95% 95% 97%  Weight:      Height:       Physical Exam Constitutional:      General: She is not in acute distress.    Appearance: Normal appearance.  HENT:     Head: Normocephalic and atraumatic.     Mouth/Throat:     Mouth: Mucous membranes are moist.     Pharynx: Oropharynx is clear. Posterior oropharyngeal erythema present.  Eyes:     Extraocular Movements: Extraocular movements intact.     Pupils: Pupils are equal, round, and reactive to light.  Neck:     Comments: No stridor, but increased upper airway sounds are noted Cardiovascular:     Rate and Rhythm: Normal rate and regular rhythm.     Pulses: Normal pulses.     Heart sounds: Normal heart sounds.  Pulmonary:     Effort: Pulmonary effort is normal. No respiratory distress.     Breath sounds: Normal breath sounds.  Abdominal:     General: Bowel sounds are normal. There is no distension.     Palpations: Abdomen is soft.     Tenderness: There is no abdominal tenderness.  Musculoskeletal:        General: No swelling or deformity.  Skin:    General: Skin is warm and dry.  Neurological:     General: No focal deficit present.     Mental Status: Mental status is at baseline.    Labs on Admission: I have personally reviewed following labs and imaging studies  CBC: Recent Labs  Lab 07/27/20 2214  WBC 23.9*  NEUTROABS 18.0*  HGB 15.3*  HCT 44.0  MCV 85.1  PLT 035    Basic Metabolic Panel: Recent Labs  Lab 07/27/20 2214  NA 138  K 3.1*  CL 96*  CO2 30  GLUCOSE 96  BUN 14  CREATININE 0.76  CALCIUM 9.4    GFR: Estimated Creatinine Clearance: 83.9 mL/min (by C-G formula based on SCr of 0.76 mg/dL).  Liver Function Tests: No results for input(s): AST, ALT, ALKPHOS, BILITOT, PROT, ALBUMIN in the last 168 hours.  Urine analysis:    Component Value Date/Time   COLORURINE AMBER (A) 07/27/2020 2015    APPEARANCEUR CLOUDY (A) 07/27/2020 2015   LABSPEC 1.029 07/27/2020 2015   PHURINE 5.0 07/27/2020 2015   GLUCOSEU NEGATIVE 07/27/2020 2015   HGBUR MODERATE (A) 07/27/2020 2015   BILIRUBINUR SMALL (A) 07/27/2020 2015   BILIRUBINUR negative 01/18/2017 1658   KETONESUR 20 (A) 07/27/2020 2015   PROTEINUR 100 (A) 07/27/2020 2015   UROBILINOGEN 0.2 01/18/2017 1658   NITRITE NEGATIVE 07/27/2020 2015   LEUKOCYTESUR MODERATE (A) 07/27/2020 2015    Radiological Exams on Admission: CT  Soft Tissue Neck W Contrast  Result Date: 07/27/2020 CLINICAL DATA:  Throat pain with difficulty swallowing EXAM: CT NECK WITH CONTRAST TECHNIQUE: Multidetector CT imaging of the neck was performed using the standard protocol following the bolus administration of intravenous contrast. CONTRAST:  75m OMNIPAQUE IOHEXOL 300 MG/ML  SOLN COMPARISON:  11/24/2017 FINDINGS: PHARYNX AND LARYNX: The nasopharynx is normal. The palatine tonsils are markedly enlarged. No peritonsillar or retropharyngeal fluid collection. The lingual tonsils are also enlarged. Normal larynx and epiglottis. SALIVARY GLANDS: Normal parotid, submandibular and sublingual glands. THYROID: Normal. LYMPH NODES: Right-greater-than-left reactive cervical lymphadenopathy. VASCULAR: Major cervical vessels are patent. LIMITED INTRACRANIAL: Normal. VISUALIZED ORBITS: None MASTOIDS AND VISUALIZED PARANASAL SINUSES: No fluid levels or advanced mucosal thickening. No mastoid effusion. SKELETON: No bony spinal canal stenosis. No lytic or blastic lesions. UPPER CHEST: Clear. OTHER: None. IMPRESSION: 1. Markedly enlarged palatine and lingual tonsils, consistent with acute tonsillopharyngitis. No peritonsillar or retropharyngeal abscess. 2. Reactive cervical lymphadenopathy. Electronically Signed   By: KUlyses JarredM.D.   On: 07/27/2020 23:22    EKG: Not obtained  Assessment/Plan Principal Problem:   Tonsillopharyngitis Active Problems:   Asthma   Hypertension    Tobacco abuse   Leukocytosis   Acute lower UTI  Tonsillopharyngitis > Sore throat for 3 days, markedly enlarged palatine and lingual tonsils on CT, increased upper airway sounds. > Leukocytosis to 23 (possible component of hemoconcentration) > Received a dose of Decadron to decrease swelling in ED - Observe overnight - Ceftriaxone - Trend fever curve and white count - Continuous pulse ox  UTI > Analysis in ED consistent with UTI in setting of dysuria and increased urinary frequency > WBC elevated as above - Add on urine culture - Ceftriaxone - Trend fever and white count  Hypertension - Continue home amlodipine  Asthma - Continue home Pulmicort and as needed albuterol  Tobacco use - Nicotine patch  DVT prophylaxis: Lovenox  Code Status:   Full  Family Communication:  None on admission Disposition Plan:   Patient is from:  Home  Anticipated DC to:  Home  Anticipated DC date:  11/23 or 11/24  Anticipated DC barriers: None  Consults called:  None Admission status:  Observation, MedSurg   Severity of Illness: The appropriate patient status for this patient is OBSERVATION. Observation status is judged to be reasonable and necessary in order to provide the required intensity of service to ensure the patient's safety. The patient's presenting symptoms, physical exam findings, and initial radiographic and laboratory data in the context of their medical condition is felt to place them at decreased risk for further clinical deterioration. Furthermore, it is anticipated that the patient will be medically stable for discharge from the hospital within 2 midnights of admission. The following factors support the patient status of observation.   " The patient's presenting symptoms include sore throat, decreased p.o. intake, urinary frequency, dysuria. " The physical exam findings include increased upper airway sounds. " The initial radiographic and laboratory data are consistent with  tonsillopharyngitis with markedly enlarged tonsils.   AMarcelyn BruinsMD [facility]  How to contact the TBer[facility] PaAttending or Consulting provider [place] covering provider during after hours [place] for this patient?   1. Check the care team in [facility] look for a) attending/consulting [facility] provider listed and b) the [facility] listed 2. Log into [email or web address] and use [place]'s universal password to access. If you do not have the password, please contact the hospital operator. 3.  Locate the [facility] provider you are looking for under [facility] and page to a number that you can be directly reached. 4. If you still have difficulty reaching the provider, please page the [facility] (Director on Call) for the Hospitalists listed on amion for assistance.  07/28/2020, 12:55 AM

## 2020-07-28 NOTE — Care Plan (Signed)
This 49 years old female with medical history significant for asthma, hypertension, pancreatitis, alcohol use, tobacco use presents in the ED with c/o: sore throat and dysuria for 3 days.  CT soft tissue neck : showed tonsillopharyngitis.  UA consistent with UTI.   leukocytosis 23.  Patient is admitted for tonsillopharyngitis and UTI started on ceftriaxone, blood cultures, urine culture pending. Patient was seen and examined.  she feels improved.

## 2020-07-28 NOTE — Progress Notes (Signed)
Patient upon hearing the news that her dad is transported to [facility] with unresponsiveness, she became very anxious to go see her dad in the ED. Patient advised she could not not go down and see dad since she is a patient herself. After receiving her IV antibiotic, she decided to leave AMA. Form given, education done, patient appended signature and left. Assessments remained unchanged prior.

## 2020-07-29 ENCOUNTER — Telehealth: Payer: Self-pay

## 2020-07-29 ENCOUNTER — Other Ambulatory Visit (INDEPENDENT_AMBULATORY_CARE_PROVIDER_SITE_OTHER): Payer: Self-pay | Admitting: Primary Care

## 2020-07-29 MED ORDER — CLINDAMYCIN HCL 300 MG PO CAPS: 300.0000 mg | ORAL_CAPSULE | Freq: Three times a day (TID) | ORAL | 0 refills | Status: DC

## 2020-07-29 MED ORDER — PHENAZOPYRIDINE HCL 100 MG PO TABS: 100.0000 mg | ORAL_TABLET | Freq: Three times a day (TID) | ORAL | 0 refills | Status: DC | PRN

## 2020-07-29 NOTE — Telephone Encounter (Signed)
Transition Care Management Follow-up Telephone Call  Date of discharge and from where: left [facility] 07/28/2020  How have you been since you were released from the hospital? She said that she still has a sore throat as well as a UTI and would like Ms Randa Evens, NP to prescribe antibiotics for her.  She explained that she needed to leave the hospital to see her father who was brought to the ED unresponsive. He has dementia and she is his caregiver.  She did not want to go back to the ED.  She has an 40 year old daughter at home and has no one to care for her. Informed her that this CM would inform Ms Randa Evens, NP of this request but there is no guarantee that she will be able to fill these medications and she ( patient) may need to return to the ED/hospital for treatment. Message was sent to Ms Randa Evens, NP with patient's request.   Any questions or concerns? Yes -noted above  Items Reviewed:  Did the pt receive and understand the discharge instructions provided? no discharge instructions, left AMA  Medications obtained and verified? no new medications ordered, left AMA.  she said that she has her medications delivered uses [facility].  Other? No   Any new allergies since your discharge? No , none reported   Do you have support at home? No , she said she lives with her daughter but has no one else in the area to assist.  Home Care and Equipment/Supplies: Were home health services ordered? no If so, what is the name of the agency? n/a Has the agency set up a time to come to the patient's home? n/a Were any new equipment or medical supplies ordered?  No What is the name of the medical supply agency? n/a Were you able to get the supplies/equipment?  n/a Do you have any questions related to the use of the equipment or supplies? No, n/a  Functional Questionnaire: (I = Independent and D = Dependent) ADLs: independent  Follow up appointments reviewed:   PCP Hospital f/u appt  confirmed? appointment not scheduled  Specialist Hospital f/u appt confirmed? none scheduled   Are transportation arrangements needed? not addressed  If their condition worsens, is the pt aware to call PCP or go to the Emergency Dept.? yes  Was the patient provided with contact information for the PCP's office or ED? Yes  Was to pt encouraged to call back with questions or concerns?  Yes  Call returned to the patient this afternoon and informed her that [facility] sent prescription for antibiotic and pyridium to her pharmacy.

## 2020-07-29 NOTE — Telephone Encounter (Signed)
Refilled per protocol, no follow up on file.

## 2020-07-31 ENCOUNTER — Other Ambulatory Visit (INDEPENDENT_AMBULATORY_CARE_PROVIDER_SITE_OTHER): Payer: Self-pay | Admitting: Primary Care

## 2020-07-31 ENCOUNTER — Ambulatory Visit (INDEPENDENT_AMBULATORY_CARE_PROVIDER_SITE_OTHER): Payer: Self-pay

## 2020-07-31 NOTE — Telephone Encounter (Signed)
Patient called and says on Tuesday, 07/29/20, she went to the ED and was given an injection in her lower right abdomen to prevent blood clots in her legs. She says she was supposed to stay overnight, but signed herself out. She says today she notices a bruise to that area about the size of a golf ball, hard, and itchy. She says the color is blue and purple. She denies any other symptoms. I advised this will be sent to the office for Marcelino Duster to review on Monday with the office opens and someone will call with her recommendation. Care advice given, patient verbalized understanding.  Reason for Disposition . Minor bruising at site of heparin injection  (e.g., Heparin, Lovenox, Innohep)  Answer Assessment - Initial Assessment Questions 1. APPEARANCE of BRUISE: "Describe the bruise."      Purple and blue 2. SIZE: "How large is the bruise?"      Golf ball  3. NUMBER: "How many bruises are there?"      One bruise 4. LOCATION: "Where is the bruise located?"      Right lower side of abdomen 5. ONSET: "How long ago did the bruise occur?"      07/28/20  6. CAUSE: "Tell me how it happened."     Injection to prevent blood clots in the legs 7. MEDICAL HISTORY: "Do you have any medical problems that can cause easy bruising or bleeding?" (e.g., leukemia, liver disease, recent chemotherapy)      No 8. MEDICATIONS: "Do you take any medications which thin the blood such as: aspirin, heparin, ibuprofen (NSAIDS), Plavix, or Coumadin?"      No 9. OTHER SYMPTOMS: "Do you have any other symptoms?"  (e.g., weakness, dizziness, pain, fever, nosebleed, blood in urine/stool)     No 10. PREGNANCY: "Is there any chance you are pregnant?" "When was your last menstrual period?"     No  Protocols used: BRUISES-A-AH

## 2020-08-05 NOTE — Discharge Summary (Signed)
Physician Discharge Summary  Robin Fischer MRN:[id number] DOB: 05/30/1971 DOA: 07/27/2020  PCP: Edwards, Michelle P, NP  Admit date: 07/27/2020   Discharge date: 07/28/2020 Admitted From: Home. Disposition: Left AGAINST MEDICAL ADVICE.  Recommendations for Outpatient Follow-up:  1. Follow up with PCP in 1-2 weeks. 2. Please obtain BMP/CBC in one week 3. Patient left AGAINST MEDICAL ADVICE.  Home Health: None Equipment/Devices: None Discharge Condition: Fair CODE STATUS:Full code Diet recommendation: Heart Healthy  Brief Summary/ Hospital course: This 49 years old female with medical history significant for asthma, hypertension, pancreatitis, alcohol use, tobacco use presents in the ED with c/o: sore throat and dysuria for 3 days.  CT soft tissue neck : showed tonsillopharyngitis.  UA consistent with UTI.   leukocytosis 23K.  Patient was admitted for tonsillopharyngitis and UTI and started on ceftriaxone, blood cultures, urine culture were pending.  Patient upon hearing the news that her father was taken to the ED with unresponsiveness.  She became very anxious to see her father.  Patient decided to leave AGAINST MEDICAL ADVICE.  She was explained the risks and benefits she understood and left.   Discharge Diagnoses:  Principal Problem:   Tonsillopharyngitis Active Problems:   Asthma   Hypertension   Tobacco abuse   Leukocytosis   Acute lower UTI    Discharge Instructions She left AGAINST MEDICAL ADVICE.  Allergies as of 07/28/2020   No Known Allergies     Medication List    ASK your doctor about these medications   acetaminophen 325 MG tablet Commonly known as: Tylenol Take 2 tablets (650 mg total) by mouth every 6 (six) hours as needed.   atorvastatin 40 MG tablet Commonly known as: LIPITOR Take 1 tablet (40 mg total) by mouth daily.   benzonatate 100 MG capsule Commonly known as: TESSALON Take 1 capsule (100 mg total) by mouth every 8 (eight) hours.    Blood Pressure Monitor Kit 1 Bag by Does not apply route 3 (three) times daily as needed.   budesonide 180 MCG/ACT inhaler Commonly known as: PULMICORT Inhale 2 puffs into the lungs 2 (two) times daily.   cetirizine 10 MG tablet Commonly known as: ZYRTEC Take 1 tablet (10 mg total) by mouth daily.   fluticasone 50 MCG/ACT nasal spray Commonly known as: FLONASE Place 1 spray into both nostrils daily.   guaiFENesin 100 MG/5ML Soln Commonly known as: ROBITUSSIN Take 5 mLs (100 mg total) by mouth every 4 (four) hours as needed for cough or to loosen phlegm.   Olopatadine HCl 0.2 % Soln Place 1 drop into both eyes every morning.   ProAir HFA 108 (90 Base) MCG/ACT inhaler Generic drug: albuterol INHALE 2 PUFFS INTO THE LUNGS EVERY 6 (SIX) HOURS AS NEEDED FOR WHEEZING OR SHORTNESS OF BREATH.   Restasis 0.05 % ophthalmic emulsion Generic drug: cycloSPORINE Place 1 drop into both eyes 2 (two) times daily.   traZODone 100 MG tablet Commonly known as: DESYREL TAKE ONE TABLET BY MOUTH DAILY AT BEDTIME.       No Known Allergies  Consultations:  None   Procedures/Studies: CT Soft Tissue Neck W Contrast  Result Date: 07/27/2020 CLINICAL DATA:  Throat pain with difficulty swallowing EXAM: CT NECK WITH CONTRAST TECHNIQUE: Multidetector CT imaging of the neck was performed using the standard protocol following the bolus administration of intravenous contrast. CONTRAST:  75mL OMNIPAQUE IOHEXOL 300 MG/ML  SOLN COMPARISON:  11/24/2017 FINDINGS: PHARYNX AND LARYNX: The nasopharynx is normal. The palatine tonsils are markedly enlarged. No peritonsillar or   retropharyngeal fluid collection. The lingual tonsils are also enlarged. Normal larynx and epiglottis. SALIVARY GLANDS: Normal parotid, submandibular and sublingual glands. THYROID: Normal. LYMPH NODES: Right-greater-than-left reactive cervical lymphadenopathy. VASCULAR: Major cervical vessels are patent. LIMITED INTRACRANIAL: Normal.  VISUALIZED ORBITS: None MASTOIDS AND VISUALIZED PARANASAL SINUSES: No fluid levels or advanced mucosal thickening. No mastoid effusion. SKELETON: No bony spinal canal stenosis. No lytic or blastic lesions. UPPER CHEST: Clear. OTHER: None. IMPRESSION: 1. Markedly enlarged palatine and lingual tonsils, consistent with acute tonsillopharyngitis. No peritonsillar or retropharyngeal abscess. 2. Reactive cervical lymphadenopathy. Electronically Signed   By: Kevin  Herman M.D.   On: 07/27/2020 23:22     Subjective: Patient left AGAINST MEDICAL ADVICE.  Patient was seen earlier in the day, reports burning while urination and pain while swallowing, denies any fever.  Discharge Exam: Vitals:   07/28/20 1755 07/28/20 1910  BP: (!) 157/75   Pulse: 80   Resp: 16   Temp: 99 F (37.2 C)   SpO2: 96% 95%   Vitals:   07/28/20 0521 07/28/20 1221 07/28/20 1755 07/28/20 1910  BP: (!) 143/80 (!) 154/84 (!) 157/75   Pulse: 92 94 80   Resp: 18 19 16   Temp: 98.4 F (36.9 C) 98.1 F (36.7 C) 99 F (37.2 C)   TempSrc: Oral Oral Oral   SpO2: 94% 92% 96% 95%  Weight:      Height:        General: Pt alert, awake, not in acute distress Cardiovascular: RRR, S1/S2 +, no rubs, no gallops Respiratory: CTA bilaterally, no wheezing, no rhonchi Abdominal: Soft, NT, ND, bowel sounds + Extremities: no edema, no cyanosis    The results of significant diagnostics from this hospitalization (including imaging, microbiology, ancillary and laboratory) are listed below for reference.     Microbiology: Recent Results (from the past 240 hour(s))  Group A Strep by PCR     Status: Abnormal   Collection Time: 07/27/20  9:22 PM   Specimen: Throat; Sterile Swab  Result Value Ref Range Status   Group A Strep by PCR DETECTED (A) NOT DETECTED Final    Comment: Performed at [facility], [address] , [place] [address]  Respiratory Panel by RT PCR (Flu A&B, Covid) - Nasopharyngeal Swab     Status: None    Collection Time: 07/28/20 12:38 AM   Specimen: Nasopharyngeal Swab; Nasopharyngeal(NP) swabs in vial transport medium  Result Value Ref Range Status   SARS Coronavirus 2 by RT PCR NEGATIVE NEGATIVE Final    Comment: (NOTE) SARS-CoV-2 target nucleic acids are NOT DETECTED.  The SARS-CoV-2 RNA is generally detectable in upper respiratoy specimens during the acute phase of infection. The lowest concentration of SARS-CoV-2 viral copies this assay can detect is 131 copies/mL. A negative result does not preclude SARS-Cov-2 infection and should not be used as the sole basis for treatment or other patient management decisions. A negative result may occur with  improper specimen collection/handling, submission of specimen other than nasopharyngeal swab, presence of viral mutation(s) within the areas targeted by this assay, and inadequate number of viral copies (<131 copies/mL). A negative result must be combined with clinical observations, patient history, and epidemiological information. The expected result is Negative.  Fact Sheet for Patients:  https://www.fda.gov/media/142436/download  Fact Sheet for Healthcare Providers:  https://www.fda.gov/media/142435/download  This test is no t yet approved or cleared by the [place] FDA and  has been authorized for detection and/or diagnosis of SARS-CoV-2 by FDA under an Emergency Use   Authorization (EUA). This EUA will remain  in effect (meaning this test can be used) for the duration of the COVID-19 declaration under Section 564(b)(1) of the Act, 21 U.S.C. section 360bbb-3(b)(1), unless the authorization is terminated or revoked sooner.     Influenza A by PCR NEGATIVE NEGATIVE Final   Influenza B by PCR NEGATIVE NEGATIVE Final    Comment: (NOTE) The Xpert Xpress SARS-CoV-2/FLU/RSV assay is intended as an aid in  the diagnosis of influenza from Nasopharyngeal swab specimens and  should not be used as a sole basis for treatment. Nasal  washings and  aspirates are unacceptable for Xpert Xpress SARS-CoV-2/FLU/RSV  testing.  Fact Sheet for Patients: PinkCheek.be  Fact Sheet for Healthcare Providers: GravelBags.it  This test is not yet approved or cleared by the [place] FDA and  has been authorized for detection and/or diagnosis of SARS-CoV-2 by  FDA under an Emergency Use Authorization (EUA). This EUA will remain  in effect (meaning this test can be used) for the duration of the  Covid-19 declaration under Section 564(b)(1) of the Act, 21  U.S.C. section 360bbb-3(b)(1), unless the authorization is  terminated or revoked. Performed at [facility], [place] [address], [place], [place] [address]      Labs: BNP (last 3 results) No results for input(s): BNP in the last 8760 hours. Basic Metabolic Panel: No results for input(s): NA, K, CL, CO2, GLUCOSE, BUN, CREATININE, CALCIUM, MG, PHOS in the last 168 hours. Liver Function Tests: No results for input(s): AST, ALT, ALKPHOS, BILITOT, PROT, ALBUMIN in the last 168 hours. No results for input(s): LIPASE, AMYLASE in the last 168 hours. No results for input(s): AMMONIA in the last 168 hours. CBC: No results for input(s): WBC, NEUTROABS, HGB, HCT, MCV, PLT in the last 168 hours. Cardiac Enzymes: No results for input(s): CKTOTAL, CKMB, CKMBINDEX, TROPONINI in the last 168 hours. BNP: Invalid input(s): POCBNP CBG: No results for input(s): GLUCAP in the last 168 hours. D-Dimer No results for input(s): DDIMER in the last 72 hours. Hgb A1c No results for input(s): HGBA1C in the last 72 hours. Lipid Profile No results for input(s): CHOL, HDL, LDLCALC, TRIG, CHOLHDL, LDLDIRECT in the last 72 hours. Thyroid function studies No results for input(s): TSH, T4TOTAL, T3FREE, THYROIDAB in the last 72 hours.  Invalid input(s): FREET3 Anemia work up No results for input(s): VITAMINB12, FOLATE, FERRITIN, TIBC,  IRON, RETICCTPCT in the last 72 hours. Urinalysis    Component Value Date/Time   COLORURINE AMBER (A) 07/27/2020 2015   APPEARANCEUR CLOUDY (A) 07/27/2020 2015   LABSPEC 1.029 07/27/2020 2015   PHURINE 5.0 07/27/2020 2015   GLUCOSEU NEGATIVE 07/27/2020 2015   HGBUR MODERATE (A) 07/27/2020 2015   BILIRUBINUR SMALL (A) 07/27/2020 2015   BILIRUBINUR negative 01/18/2017 1658   KETONESUR 20 (A) 07/27/2020 2015   PROTEINUR 100 (A) 07/27/2020 2015   UROBILINOGEN 0.2 01/18/2017 1658   NITRITE NEGATIVE 07/27/2020 2015   LEUKOCYTESUR MODERATE (A) 07/27/2020 2015   Sepsis Labs Invalid input(s): PROCALCITONIN,  WBC,  LACTICIDVEN Microbiology Recent Results (from the past 240 hour(s))  Group A Strep by PCR     Status: Abnormal   Collection Time: 07/27/20  9:22 PM   Specimen: Throat; Sterile Swab  Result Value Ref Range Status   Group A Strep by PCR DETECTED (A) NOT DETECTED Final    Comment: Performed at [facility], [address], [place], [place] [address]  Respiratory Panel by RT PCR (Flu A&B, Covid) - Nasopharyngeal  Swab     Status: None   Collection Time: 07/28/20 12:38 AM   Specimen: Nasopharyngeal Swab; Nasopharyngeal(NP) swabs in vial transport medium  Result Value Ref Range Status   SARS Coronavirus 2 by RT PCR NEGATIVE NEGATIVE Final    Comment: (NOTE) SARS-CoV-2 target nucleic acids are NOT DETECTED.  The SARS-CoV-2 RNA is generally detectable in upper respiratoy specimens during the acute phase of infection. The lowest concentration of SARS-CoV-2 viral copies this assay can detect is 131 copies/mL. A negative result does not preclude SARS-Cov-2 infection and should not be used as the sole basis for treatment or other patient management decisions. A negative result may occur with  improper specimen collection/handling, submission of specimen other than nasopharyngeal swab, presence of viral mutation(s) within the areas targeted by this assay, and inadequate number of  viral copies (<131 copies/mL). A negative result must be combined with clinical observations, patient history, and epidemiological information. The expected result is Negative.  Fact Sheet for Patients:  PinkCheek.be  Fact Sheet for Healthcare Providers:  GravelBags.it  This test is no t yet approved or cleared by the [place] FDA and  has been authorized for detection and/or diagnosis of SARS-CoV-2 by FDA under an Emergency Use Authorization (EUA). This EUA will remain  in effect (meaning this test can be used) for the duration of the COVID-19 declaration under Section 564(b)(1) of the Act, 21 U.S.C. section 360bbb-3(b)(1), unless the authorization is terminated or revoked sooner.     Influenza A by PCR NEGATIVE NEGATIVE Final   Influenza B by PCR NEGATIVE NEGATIVE Final    Comment: (NOTE) The Xpert Xpress SARS-CoV-2/FLU/RSV assay is intended as an aid in  the diagnosis of influenza from Nasopharyngeal swab specimens and  should not be used as a sole basis for treatment. Nasal washings and  aspirates are unacceptable for Xpert Xpress SARS-CoV-2/FLU/RSV  testing.  Fact Sheet for Patients: PinkCheek.be  Fact Sheet for Healthcare Providers: GravelBags.it  This test is not yet approved or cleared by the [place] FDA and  has been authorized for detection and/or diagnosis of SARS-CoV-2 by  FDA under an Emergency Use Authorization (EUA). This EUA will remain  in effect (meaning this test can be used) for the duration of the  Covid-19 declaration under Section 564(b)(1) of the Act, 21  U.S.C. section 360bbb-3(b)(1), unless the authorization is  terminated or revoked. Performed at [facility], [place] [address] [place], [place] [address]      Time coordinating discharge: Over 30 minutes  SIGNED:   Shawna Clamp, MD  [facility] 08/05/2020, 5:58 PM Pager   If 7PM-7AM, please contact night-coverage [email or web address]

## 2020-09-24 ENCOUNTER — Other Ambulatory Visit (INDEPENDENT_AMBULATORY_CARE_PROVIDER_SITE_OTHER): Payer: Self-pay | Admitting: Primary Care

## 2021-02-04 ENCOUNTER — Other Ambulatory Visit (INDEPENDENT_AMBULATORY_CARE_PROVIDER_SITE_OTHER): Payer: Self-pay | Admitting: Primary Care

## 2021-02-04 NOTE — Telephone Encounter (Signed)
Call to patient- medication follow up appointment scheduled- #30 given

## 2021-03-01 ENCOUNTER — Ambulatory Visit (INDEPENDENT_AMBULATORY_CARE_PROVIDER_SITE_OTHER): Payer: Medicaid Other | Admitting: Primary Care

## 2021-03-09 ENCOUNTER — Telehealth (INDEPENDENT_AMBULATORY_CARE_PROVIDER_SITE_OTHER): Payer: Self-pay | Admitting: Primary Care

## 2021-03-09 ENCOUNTER — Other Ambulatory Visit (INDEPENDENT_AMBULATORY_CARE_PROVIDER_SITE_OTHER): Payer: Self-pay | Admitting: Primary Care

## 2021-03-09 DIAGNOSIS — F4320 Adjustment disorder, unspecified: Secondary | ICD-10-CM

## 2021-03-09 DIAGNOSIS — G4700 Insomnia, unspecified: Secondary | ICD-10-CM

## 2021-03-09 DIAGNOSIS — K219 Gastro-esophageal reflux disease without esophagitis: Secondary | ICD-10-CM

## 2021-03-09 MED ORDER — FAMOTIDINE 20 MG PO TABS: 20.0000 mg | ORAL_TABLET | Freq: Every day | ORAL | 0 refills | Status: DC

## 2021-03-09 MED ORDER — ALPRAZOLAM 0.25 MG PO TABS: 0.2500 mg | ORAL_TABLET | Freq: Every evening | ORAL | 0 refills | Status: DC | PRN

## 2021-03-09 NOTE — Telephone Encounter (Signed)
Medication: omeprazole (PRILOSEC) 40 MG capsule [[id number]]  DISCONTINUED  Has the patient contacted their pharmacy? YES  (Agent: If no, request that the patient contact the pharmacy for the refill.) (Agent: If yes, when and what did the pharmacy advise?)  Preferred Pharmacy (with phone number or street name): [facility] - [place], [place] - [address] [place] [address] Phone: [phone] Fax: [phone] Hours: Not open 24 hours

## 2021-03-09 NOTE — Progress Notes (Signed)
CMA called patient after we received information from [facility] Jasslyn Finkel died on 03/24/2021- grieving sent in xanax .25mg  prn at [facility] for anxiety and insomnia , also asked for medication for GERD Pepcid sent. She was instructed after she handle everything she needs to schedule an appt. Patient agreed

## 2021-03-09 NOTE — Telephone Encounter (Signed)
Patient called and advised she will need an appointment scheduled before sending in refills. She says her father passed on 2022-12-30 and she is not able to schedule at this time. She says she can't sleep and just spoke to [place] who is sending in something to help with that. I advised Alprazolam and Famotidine were sent in already. I advised I will send this request to [facility] for approval as well.

## 2021-03-09 NOTE — Telephone Encounter (Signed)
Patient says this medication doesn't work for her. She says Robin Fischer is sending something else in. I advised Famotidine was sent today as well as Alprazolam. Patient verbalized understanding.

## 2021-03-09 NOTE — Telephone Encounter (Signed)
Requested medication (s) are due for refill today: Yes  Requested medication (s) are on the active medication list: Yes  Last refill:  7 and 9 months ago  Future visit scheduled: No  Notes to clinic:  Unable to refill per protocol, appointment needed.       Requested Prescriptions  Pending Prescriptions Disp Refills   amLODipine (NORVASC) 10 MG tablet [Pharmacy Med Name: AMLODIPINE BESYLATE 10 MG ORAL TABLET] 90 tablet 1    Sig: TAKE 1 (ONE) TABLET (10 MG TOTAL) BY MOUTH DAILY.      Cardiovascular:  Calcium Channel Blockers Failed - 03/09/2021  3:36 PM      Failed - Last BP in normal range    BP Readings from Last 1 Encounters:  07/28/20 (!) 157/75          Failed - Valid encounter within last 6 months    Recent Outpatient Visits           1 year ago Essential hypertension   [facility] Grayce Sessions, NP   1 year ago Hypertension, unspecified type   [facility] Grayce Sessions, NP   1 year ago Need for Tdap vaccination   [facility] Grayce Sessions, NP   3 years ago Hypertension, unspecified type   [facility] Loletta Specter, PA-C   4 years ago Dysuria   [facility] Loletta Specter, PA-C                  traZODone (DESYREL) 100 MG tablet [Pharmacy Med Name: TRAZODONE HCL 100 MG ORAL TABLET] 90 tablet 1    Sig: TAKE ONE TABLET BY MOUTH DAILY AT BEDTIME.      Psychiatry: Antidepressants - Serotonin Modulator Failed - 03/09/2021  3:36 PM      Failed - Valid encounter within last 6 months    Recent Outpatient Visits           1 year ago Essential hypertension   [facility] Grayce Sessions, NP   1 year ago Hypertension, unspecified type   [facility] Grayce Sessions, NP   1 year ago Need for Tdap vaccination   [facility] Grayce Sessions, NP   3  years ago Hypertension, unspecified type   [facility] Loletta Specter, PA-C   4 years ago Dysuria   [facility] Loletta Specter, PA-C

## 2021-04-02 ENCOUNTER — Emergency Department (HOSPITAL_COMMUNITY)
Admission: EM | Admit: 2021-04-02 | Discharge: 2021-04-03 | Disposition: A | Discharge status: Home / Self Care | Payer: Medicaid Other | Attending: Emergency Medicine | Admitting: Emergency Medicine

## 2021-04-02 ENCOUNTER — Other Ambulatory Visit: Payer: Self-pay

## 2021-04-02 DIAGNOSIS — R112 Nausea with vomiting, unspecified: Secondary | ICD-10-CM

## 2021-04-02 DIAGNOSIS — U071 COVID-19: Secondary | ICD-10-CM | POA: Insufficient documentation

## 2021-04-02 DIAGNOSIS — R7989 Other specified abnormal findings of blood chemistry: Secondary | ICD-10-CM

## 2021-04-02 DIAGNOSIS — I10 Essential (primary) hypertension: Secondary | ICD-10-CM | POA: Insufficient documentation

## 2021-04-02 DIAGNOSIS — J45909 Unspecified asthma, uncomplicated: Secondary | ICD-10-CM | POA: Diagnosis not present

## 2021-04-02 DIAGNOSIS — Z7951 Long term (current) use of inhaled steroids: Secondary | ICD-10-CM | POA: Diagnosis not present

## 2021-04-02 DIAGNOSIS — Z79899 Other long term (current) drug therapy: Secondary | ICD-10-CM | POA: Insufficient documentation

## 2021-04-02 DIAGNOSIS — K219 Gastro-esophageal reflux disease without esophagitis: Secondary | ICD-10-CM | POA: Insufficient documentation

## 2021-04-02 DIAGNOSIS — F17200 Nicotine dependence, unspecified, uncomplicated: Secondary | ICD-10-CM | POA: Diagnosis not present

## 2021-04-02 DIAGNOSIS — R1010 Upper abdominal pain, unspecified: Secondary | ICD-10-CM

## 2021-04-02 LAB — COMPREHENSIVE METABOLIC PANEL
ALT: 46 U/L — ABNORMAL HIGH (ref 0–44)
AST: 181 U/L — ABNORMAL HIGH (ref 15–41)
Albumin: 3.6 g/dL (ref 3.5–5.0)
Alkaline Phosphatase: 352 U/L — ABNORMAL HIGH (ref 38–126)
Anion gap: 14 (ref 5–15)
BUN: <5 mg/dL — ABNORMAL LOW (ref 6–20)
CO2: 28 mmol/L (ref 22–32)
Calcium: 9 mg/dL (ref 8.9–10.3)
Chloride: 95 mmol/L — ABNORMAL LOW (ref 98–111)
Creatinine, Ser: 0.69 mg/dL (ref 0.44–1.00)
GFR, Estimated: >60 mL/min (ref >60)
Glucose, Bld: 98 mg/dL (ref 70–99)
Potassium: 3.3 mmol/L — ABNORMAL LOW (ref 3.5–5.1)
Sodium: 137 mmol/L (ref 135–145)
Total Bilirubin: 2.1 mg/dL — ABNORMAL HIGH (ref 0.3–1.2)
Total Protein: 6.8 g/dL (ref 6.5–8.1)

## 2021-04-02 LAB — CBC WITH DIFFERENTIAL/PLATELET
Abs Immature Granulocytes: 0.04 10*3/uL (ref 0.00–0.07)
Basophils Absolute: 0.1 10*3/uL (ref 0.0–0.1)
Basophils Relative: 1 %
Eosinophils Absolute: 0.1 10*3/uL (ref 0.0–0.5)
Eosinophils Relative: 1 %
HCT: 45.8 % (ref 36.0–46.0)
Hemoglobin: 16.9 g/dL — ABNORMAL HIGH (ref 12.0–15.0)
Immature Granulocytes: 0 %
Lymphocytes Relative: 14 %
Lymphs Abs: 1.3 10*3/uL (ref 0.7–4.0)
MCH: 30.6 pg (ref 26.0–34.0)
MCHC: 36.9 g/dL — ABNORMAL HIGH (ref 30.0–36.0)
MCV: 82.8 fL (ref 80.0–100.0)
Monocytes Absolute: 0.8 10*3/uL (ref 0.1–1.0)
Monocytes Relative: 9 %
Neutro Abs: 6.8 10*3/uL (ref 1.7–7.7)
Neutrophils Relative %: 75 %
Platelets: 243 10*3/uL (ref 150–400)
RBC: 5.53 MIL/uL — ABNORMAL HIGH (ref 3.87–5.11)
RDW: 14.6 % (ref 11.5–15.5)
WBC: 9.1 10*3/uL (ref 4.0–10.5)
nRBC: 0 % (ref 0.0–0.2)

## 2021-04-02 LAB — URINALYSIS, ROUTINE W REFLEX MICROSCOPIC
Bacteria, UA: NONE SEEN
Glucose, UA: NEGATIVE mg/dL
Hgb urine dipstick: NEGATIVE
Ketones, ur: NEGATIVE mg/dL
Leukocytes,Ua: NEGATIVE
Nitrite: NEGATIVE
Protein, ur: 30 mg/dL — AB
Specific Gravity, Urine: 1.023 (ref 1.005–1.030)
pH: 9 — ABNORMAL HIGH (ref 5.0–8.0)

## 2021-04-02 LAB — LIPASE, BLOOD: Lipase: 19 U/L (ref 11–51)

## 2021-04-02 MED ORDER — ONDANSETRON 4 MG PO TBDP
4.0000 mg | ORAL_TABLET | Freq: Once | ORAL | Status: AC
  Administered 2021-04-02: 4 mg via ORAL
  Filled 2021-04-02: qty 1

## 2021-04-02 MED ORDER — ACETAMINOPHEN 325 MG PO TABS
650.0000 mg | ORAL_TABLET | Freq: Once | ORAL | Status: AC
  Administered 2021-04-02: 650 mg via ORAL
  Filled 2021-04-02: qty 2

## 2021-04-02 NOTE — ED Provider Notes (Signed)
Emergency Medicine Provider Triage Evaluation Note  Robin Fischer , a 50 y.o. female  was evaluated in triage.  Pt complains of nvd that started today. Also c/o abd and recent covid exposure.  Review of Systems  Positive: Nvd, abd pain Negative: Chest pain  Physical Exam  BP 118/90 (BP Location: Left Arm)   Pulse 94   Temp (!) 101.3 F (38.5 C) (Oral)   Resp 20   SpO2 97%  Gen:   Awake, no distress   Resp:  Normal effort  MSK:   Moves extremities without difficulty  Other:  Abd is diffusely ttp  Medical Decision Making  Medically screening exam initiated at 8:07 PM.  Appropriate orders placed.  [name] was informed that the remainder of the evaluation will be completed by another provider, this initial triage assessment does not replace that evaluation, and the importance of remaining in the ED until their evaluation is complete.     [name] 04/02/21 2008    Plunkett, Whitney, MD 04/02/21 2231

## 2021-04-02 NOTE — ED Triage Notes (Signed)
Pt BIB [facility] c/o lower abdominal pain with NV, generalized bodyaches x2 days. Has been unable to keep food down. HX pancreatitis. Recent COVID exposure

## 2021-04-03 LAB — SARS CORONAVIRUS 2 (TAT 6-24 HRS): SARS Coronavirus 2: POSITIVE — AB

## 2021-04-03 MED ORDER — MOLNUPIRAVIR EUA 200MG CAPSULE: 4.0000 | ORAL_CAPSULE | Freq: Two times a day (BID) | ORAL | 0 refills | Status: AC

## 2021-04-03 MED ORDER — ONDANSETRON 4 MG PO TBDP
8.0000 mg | ORAL_TABLET | Freq: Once | ORAL | Status: AC
  Administered 2021-04-03: 8 mg via ORAL
  Filled 2021-04-03: qty 2

## 2021-04-03 MED ORDER — MOLNUPIRAVIR EUA 200MG CAPSULE: 4.0000 | ORAL_CAPSULE | Freq: Two times a day (BID) | ORAL | 0 refills | Status: DC

## 2021-04-03 NOTE — ED Notes (Signed)
Pt ambulated to the bathroom in zone with no pt's due to covid status. Pt ambulated on her own power, gait even and steady.

## 2021-04-03 NOTE — ED Notes (Signed)
Pt tolerated po intake.  

## 2021-04-03 NOTE — ED Provider Notes (Addendum)
[facility] EMERGENCY DEPARTMENT Provider Note   CSN: [id number] Arrival date & time: 04/02/21  1956     History Chief Complaint  Patient presents with   Abdominal Pain    Robin Fischer is a 50 y.o. female.  Patient c/o fever, body aches, scratchy throat, nasal congestion, non prod cough, in the past 3-4 days. Symptoms acute onset, moderate, persistent. Pt has received vaccination for covid x 2, and had covid previously. Denies chest pain or sob. States had some upper abd pain and nausea/vomiting earlier, currently improved. No bloody or bilious emesis. No abd distension. Having normal bms. No hx pud or gallstones. +hx pancreatitis. Denies dysuria or gu c/o. No lower abd or pelvic pain. No back/flank pain.   The history is provided by the patient.  Abdominal Pain Associated symptoms: cough, fever, nausea, sore throat and vomiting   Associated symptoms: no chest pain, no dysuria and no shortness of breath       Past Medical History:  Diagnosis Date   Asthma    Bronchitis    GERD (gastroesophageal reflux disease)    High cholesterol    Hypertension    Pancreatitis    Tobacco abuse     Patient Active Problem List   Diagnosis Date Noted   Tonsillopharyngitis 07/28/2020   Acute lower UTI 07/28/2020   Dehydration 05/04/2019   Pancreatitis 05/04/2019   Hypokalemia 08/20/2018   Tobacco abuse 08/20/2018   Leukocytosis 08/20/2018   Alcohol abuse 20/25/4270   Alcoholic pancreatitis 62/37/6283   Abnormal LFTs 08/20/2018   Acute pancreatitis 08/20/2018   Asthma    Hypertension     No past surgical history on file.   OB History   No obstetric history on file.     Family History  Problem Relation Age of Onset   Hypertension Mother    Hyperlipidemia Mother    Hypertension Father    Hyperlipidemia Father     Social History   Tobacco Use   Smoking status: Every Day   Smokeless tobacco: Never  Substance Use Topics   Alcohol use: Yes     Comment: beer   Drug use: No    Home Medications Prior to Admission medications   Medication Sig Start Date End Date Taking? Authorizing Provider  acetaminophen (TYLENOL) 325 MG tablet Take 2 tablets (650 mg total) by mouth every 6 (six) hours as needed. Patient taking differently: Take 650 mg by mouth every 6 (six) hours as needed (leg pain).  05/07/20   [name], [name], PA-C  ALPRAZolam [name]) 0.25 MG tablet Take 1 tablet (0.25 mg total) by mouth at bedtime as needed for anxiety or sleep. 03/09/21   [name], NP  amLODipine (NORVASC) 10 MG tablet TAKE 1 (ONE) TABLET (10 MG TOTAL) BY MOUTH DAILY. 03/12/21   [name], NP  atorvastatin (LIPITOR) 40 MG tablet Take 1 tablet (40 mg total) by mouth daily. 01/01/20   [name], NP  benzonatate (TESSALON) 100 MG capsule Take 1 capsule (100 mg total) by mouth every 8 (eight) hours. Patient not taking: Reported on 07/28/2020 05/07/20   [name], [name], PA-C  Blood Pressure Monitor KIT 1 Bag by Does not apply route 3 (three) times daily as needed. 09/26/19   [name], NP  budesonide (PULMICORT) 180 MCG/ACT inhaler Inhale 2 puffs into the lungs 2 (two) times daily. Patient not taking: Reported on 07/28/2020 09/26/19   [name], NP  cetirizine (ZYRTEC) 10 MG tablet TAKE 1 TABLET (  10 MG TOTAL) BY MOUTH DAILY. 09/24/20   [name], NP  clindamycin (CLEOCIN) 300 MG capsule Take 1 capsule (300 mg total) by mouth 3 (three) times daily. 07/29/20   [name], NP  famotidine (PEPCID) 20 MG tablet Take 1 tablet (20 mg total) by mouth daily. 03/09/21   [name], NP  fluticasone (FLONASE) 50 MCG/ACT nasal spray Place 1 spray into both nostrils daily. 05/07/20   [name], [name], PA-C  guaiFENesin (ROBITUSSIN) 100 MG/5ML SOLN Take 5 mLs (100 mg total) by mouth every 4 (four) hours as needed for cough or to loosen phlegm. Patient not taking: Reported on 07/28/2020 05/07/20   [name], [name], PA-C  Olopatadine HCl 0.2  % SOLN Place 1 drop into both eyes every morning.  04/03/20   [provider]  phenazopyridine (PYRIDIUM) 100 MG tablet Take 1 tablet (100 mg total) by mouth 3 (three) times daily as needed for pain. 07/29/20   [name], NP  PROAIR HFA 108 (90 Base) MCG/ACT inhaler INHALE 2 PUFFS INTO THE LUNGS EVERY 6 (SIX) HOURS AS NEEDED FOR WHEEZING OR SHORTNESS OF BREATH. Patient taking differently: Inhale 2 puffs into the lungs every 4 (four) hours as needed for wheezing or shortness of breath.  07/02/20   Charlott Rakes, MD  RESTASIS 0.05 % ophthalmic emulsion Place 1 drop into both eyes 2 (two) times daily. 02/28/20   [provider]  traZODone (DESYREL) 100 MG tablet TAKE ONE TABLET BY MOUTH DAILY AT BEDTIME. 03/12/21   [name], NP    Allergies    Patient has no known allergies.  Review of Systems   Review of Systems  Constitutional:  Positive for fever.  HENT:  Positive for congestion, rhinorrhea and sore throat.   Eyes:  Negative for redness.  Respiratory:  Positive for cough. Negative for shortness of breath.   Cardiovascular:  Negative for chest pain and leg swelling.  Gastrointestinal:  Positive for abdominal pain, nausea and vomiting.  Genitourinary:  Negative for dysuria and flank pain.  Musculoskeletal:  Positive for myalgias. Negative for neck pain and neck stiffness.  Skin:  Negative for rash.  Neurological:  Negative for headaches.  Hematological:  Does not bruise/bleed easily.  Psychiatric/Behavioral:  Negative for confusion.    Physical Exam Updated Vital Signs BP 129/82 (BP Location: Left Arm)   Pulse 69   Temp 99.4 F (37.4 C)   Resp 18   SpO2 94%   Physical Exam Vitals and nursing note reviewed.  Constitutional:      Appearance: Normal appearance. She is well-developed.  HENT:     Head: Atraumatic.     Nose: Congestion present.     Mouth/Throat:     Mouth: Mucous membranes are moist.     Pharynx: Oropharynx is clear. No  oropharyngeal exudate or posterior oropharyngeal erythema.  Eyes:     General: No scleral icterus.    Conjunctiva/sclera: Conjunctivae normal.     Pupils: Pupils are equal, round, and reactive to light.  Neck:     Trachea: No tracheal deviation.     Comments: No stiffness or rigidity.  Cardiovascular:     Rate and Rhythm: Normal rate and regular rhythm.     Pulses: Normal pulses.     Heart sounds: Normal heart sounds. No murmur heard.   No friction rub. No gallop.  Pulmonary:     Effort: Pulmonary effort is normal. No respiratory distress.     Breath sounds: Normal breath sounds.  Abdominal:     General: Bowel sounds are normal. There is no distension.     Palpations: Abdomen is soft. There is no mass.     Tenderness: There is no abdominal tenderness. There is no guarding.  Genitourinary:    Comments: No cva tenderness.  Musculoskeletal:        General: No swelling or tenderness.     Cervical back: Normal range of motion and neck supple. No rigidity. No muscular tenderness.     Right lower leg: No edema.     Left lower leg: No edema.  Lymphadenopathy:     Cervical: No cervical adenopathy.  Skin:    General: Skin is warm and dry.     Findings: No rash.  Neurological:     Mental Status: She is alert.     Comments: Alert, speech normal.   Psychiatric:        Mood and Affect: Mood normal.    ED Results / Procedures / Treatments   Labs (all labs ordered are listed, but only abnormal results are displayed) Results for orders placed or performed during the hospital encounter of 04/02/21  SARS CORONAVIRUS 2 (TAT 6-24 HRS) Nasopharyngeal Nasopharyngeal Swab   Specimen: Nasopharyngeal Swab  Result Value Ref Range   SARS Coronavirus 2 POSITIVE (A) NEGATIVE  CBC with Differential  Result Value Ref Range   WBC 9.1 4.0 - 10.5 K/uL   RBC 5.53 (H) 3.87 - 5.11 MIL/uL   Hemoglobin 16.9 (H) 12.0 - 15.0 g/dL   HCT 45.8 36.0 - 46.0 %   MCV 82.8 80.0 - 100.0 fL   MCH 30.6 26.0 - 34.0  pg   MCHC 36.9 (H) 30.0 - 36.0 g/dL   RDW 14.6 11.5 - 15.5 %   Platelets 243 150 - 400 K/uL   nRBC 0.0 0.0 - 0.2 %   Neutrophils Relative % 75 %   Neutro Abs 6.8 1.7 - 7.7 K/uL   Lymphocytes Relative 14 %   Lymphs Abs 1.3 0.7 - 4.0 K/uL   Monocytes Relative 9 %   Monocytes Absolute 0.8 0.1 - 1.0 K/uL   Eosinophils Relative 1 %   Eosinophils Absolute 0.1 0.0 - 0.5 K/uL   Basophils Relative 1 %   Basophils Absolute 0.1 0.0 - 0.1 K/uL   Immature Granulocytes 0 %   Abs Immature Granulocytes 0.04 0.00 - 0.07 K/uL  Comprehensive metabolic panel  Result Value Ref Range   Sodium 137 135 - 145 mmol/L   Potassium 3.3 (L) 3.5 - 5.1 mmol/L   Chloride 95 (L) 98 - 111 mmol/L   CO2 28 22 - 32 mmol/L   Glucose, Bld 98 70 - 99 mg/dL   BUN <5 (L) 6 - 20 mg/dL   Creatinine, Ser 0.69 0.44 - 1.00 mg/dL   Calcium 9.0 8.9 - 10.3 mg/dL   Total Protein 6.8 6.5 - 8.1 g/dL   Albumin 3.6 3.5 - 5.0 g/dL   AST 181 (H) 15 - 41 U/L   ALT 46 (H) 0 - 44 U/L   Alkaline Phosphatase 352 (H) 38 - 126 U/L   Total Bilirubin 2.1 (H) 0.3 - 1.2 mg/dL   GFR, Estimated >60 >60 mL/min   Anion gap 14 5 - 15  Lipase, blood  Result Value Ref Range   Lipase 19 11 - 51 U/L  Urinalysis, Routine w reflex microscopic Urine, Clean Catch  Result Value Ref Range   Color, Urine AMBER (A) YELLOW   APPearance CLEAR CLEAR  Specific Gravity, Urine 1.023 1.005 - 1.030   pH 9.0 (H) 5.0 - 8.0   Glucose, UA NEGATIVE NEGATIVE mg/dL   Hgb urine dipstick NEGATIVE NEGATIVE   Bilirubin Urine MODERATE (A) NEGATIVE   Ketones, ur NEGATIVE NEGATIVE mg/dL   Protein, ur 30 (A) NEGATIVE mg/dL   Nitrite NEGATIVE NEGATIVE   Leukocytes,Ua NEGATIVE NEGATIVE   RBC / HPF 0-5 0 - 5 RBC/hpf   WBC, UA 0-5 0 - 5 WBC/hpf   Bacteria, UA NONE SEEN NONE SEEN   Squamous Epithelial / LPF 0-5 0 - 5   Mucus PRESENT    EKG None  Radiology No results found.  Procedures Procedures   Medications Ordered in ED Medications  ondansetron (ZOFRAN-ODT)  disintegrating tablet 8 mg (has no administration in time range)  acetaminophen (TYLENOL) tablet 650 mg (650 mg Oral Given 04/02/21 2010)  ondansetron (ZOFRAN-ODT) disintegrating tablet 4 mg (4 mg Oral Given 04/02/21 2011)    ED Course  I have reviewed the triage vital signs and the nursing notes.  Pertinent labs & imaging results that were available during my care of the patient were reviewed by me and considered in my medical decision making (see chart for details).    MDM Rules/Calculators/A&P                          Labs sent. Acetaminophen po.  Reviewed nursing notes and prior charts for additional history. Prior imaging studies - no  gallstones. +hx pancreatitis ?etoh related then.   Labs reviewed/interpreted by me -  covid test is positive. Ast mildly elevated > alt, ratio c/w being etoh related. Abd is soft and non tender. Lipase normal.   Zofran po. Po fluids.   Pt is tolerating po.  Normal vitals, breathing comfortably.   Pt currently appears stable for d/c.    Final Clinical Impression(s) / ED Diagnoses Final diagnoses:  None    Rx / DC Orders ED Discharge Orders     None          Lajean Saver, MD 04/03/21 [phone]

## 2021-04-03 NOTE — Discharge Instructions (Addendum)
It was our pleasure to provide your ER care today - we hope that you feel better.  Drink plenty of fluids/stay well hydrated.   You may try taking zyrtec-d or claritin-d as need for congestion.  Take molnupiravir as prescribed. Avoid smoking.   From today's labs, a few of your liver function tests are elevated - AST 181, ALT 46, bili 2.1) - for now, hold your cholesterol med, avoid alcohol use, and follow up with primary care doctor in 1-2 weeks - discuss lab results with them.   Return to ER if worse, new symptoms, increased trouble breathing, persistent vomiting, new/severe pain, or other concern.

## 2021-04-03 NOTE — ED Notes (Signed)
Pt offered ginger ale and saltines for PO challenge

## 2021-07-13 ENCOUNTER — Other Ambulatory Visit (INDEPENDENT_AMBULATORY_CARE_PROVIDER_SITE_OTHER): Payer: Self-pay | Admitting: Primary Care

## 2021-07-13 DIAGNOSIS — K219 Gastro-esophageal reflux disease without esophagitis: Secondary | ICD-10-CM

## 2021-07-13 NOTE — Telephone Encounter (Signed)
Sent to PCP ?

## 2021-07-15 ENCOUNTER — Other Ambulatory Visit (INDEPENDENT_AMBULATORY_CARE_PROVIDER_SITE_OTHER): Payer: Self-pay | Admitting: Primary Care

## 2021-07-15 DIAGNOSIS — K219 Gastro-esophageal reflux disease without esophagitis: Secondary | ICD-10-CM

## 2021-07-15 NOTE — Telephone Encounter (Unsigned)
Copied from CRM [id number]. Topic: Quick Communication - Rx Refill/Question >> Jul 15, 2021  3:06 PM Gaetana Michaelis A wrote: Medication: amLODipine (NORVASC) 10 MG tablet [[id number]]   famotidine (PEPCID) 20 MG tablet [[id number]]   Has the patient contacted their pharmacy? Yes.  The patient was directed to contact their PCP by their pharmacy  (Agent: If no, request that the patient contact the pharmacy for the refill. If patient does not wish to contact the pharmacy document the reason why and proceed with request.) (Agent: If yes, when and what did the pharmacy advise?)  Preferred Pharmacy (with phone number or street name): [facility] - [place], [place] - [address]  Phone:  [phone] Fax:  [phone]  Has the patient been seen for an appointment in the last year OR does the patient have an upcoming appointment? Yes.    Agent: Please be advised that RX refills may take up to 3 business days. We ask that you follow-up with your pharmacy.

## 2021-07-15 NOTE — Telephone Encounter (Signed)
Requested Prescriptions  Refused Prescriptions Disp Refills  . amLODipine (NORVASC) 10 MG tablet 30 tablet 0     Cardiovascular:  Calcium Channel Blockers Failed - 07/15/2021  3:18 PM      Failed - Valid encounter within last 6 months    Recent Outpatient Visits          1 year ago Essential hypertension   [facility] Grayce Sessions, NP   1 year ago Hypertension, unspecified type   [facility] Grayce Sessions, NP   2 years ago Need for Tdap vaccination   [facility] Grayce Sessions, NP   3 years ago Hypertension, unspecified type   [facility] Loletta Specter, PA-C   4 years ago Dysuria   [facility] Loletta Specter, PA-C             Passed - Last BP in normal range    BP Readings from Last 1 Encounters:  04/03/21 129/82         . famotidine (PEPCID) 20 MG tablet 30 tablet 0    Sig: Take 1 tablet (20 mg total) by mouth daily.     Gastroenterology:  H2 Antagonists Failed - 07/15/2021  3:18 PM      Failed - Valid encounter within last 12 months    Recent Outpatient Visits          1 year ago Essential hypertension   [facility] Grayce Sessions, NP   1 year ago Hypertension, unspecified type   [facility] Grayce Sessions, NP   2 years ago Need for Tdap vaccination   [facility] Grayce Sessions, NP   3 years ago Hypertension, unspecified type   [facility] Loletta Specter, PA-C   4 years ago Dysuria   [facility] Loletta Specter, PA-C

## 2021-08-16 ENCOUNTER — Other Ambulatory Visit (INDEPENDENT_AMBULATORY_CARE_PROVIDER_SITE_OTHER): Payer: Self-pay | Admitting: Primary Care

## 2021-08-16 ENCOUNTER — Ambulatory Visit (HOSPITAL_COMMUNITY)
Admission: EM | Admit: 2021-08-16 | Discharge: 2021-08-16 | Disposition: A | Discharge status: Home / Self Care | Payer: Medicaid Other | Attending: Physician Assistant | Admitting: Physician Assistant

## 2021-08-16 ENCOUNTER — Other Ambulatory Visit: Payer: Self-pay

## 2021-08-16 ENCOUNTER — Encounter (HOSPITAL_COMMUNITY): Payer: Self-pay | Admitting: Emergency Medicine

## 2021-08-16 DIAGNOSIS — N3001 Acute cystitis with hematuria: Secondary | ICD-10-CM | POA: Diagnosis not present

## 2021-08-16 DIAGNOSIS — R350 Frequency of micturition: Secondary | ICD-10-CM | POA: Diagnosis present

## 2021-08-16 DIAGNOSIS — K219 Gastro-esophageal reflux disease without esophagitis: Secondary | ICD-10-CM

## 2021-08-16 DIAGNOSIS — R300 Dysuria: Secondary | ICD-10-CM | POA: Diagnosis present

## 2021-08-16 LAB — POCT URINALYSIS DIPSTICK, ED / UC
Glucose, UA: NEGATIVE mg/dL
Nitrite: POSITIVE — AB
Protein, ur: 100 mg/dL — AB
Specific Gravity, Urine: 1.025 (ref 1.005–1.030)
Urobilinogen, UA: 2 mg/dL — ABNORMAL HIGH (ref 0.0–1.0)
pH: 6.5 (ref 5.0–8.0)

## 2021-08-16 MED ORDER — CEPHALEXIN 500 MG PO CAPS: 500.0000 mg | ORAL_CAPSULE | Freq: Four times a day (QID) | ORAL | 0 refills | Status: DC

## 2021-08-16 NOTE — ED Provider Notes (Signed)
[place]    CSN: [id number] Arrival date & time: 08/16/21  1129      History   Chief Complaint Chief Complaint  Patient presents with   Flank Pain   Urinary Frequency   Nasal Congestion    HPI Robin Fischer is a 50 y.o. female.   Patient presents today with a 3 to 4-day history of UTI symptoms.  She reports urinary frequency, urinary urgency, dysuria, malodorous urine.  She denies any abdominal pain, fever, nausea, vomiting.  Denies any vaginal symptoms including bleeding, discharge, pain.  She has not tried any over-the-counter medication for symptom management.  She does have a history of UTI and states current symptoms are similar to previous episodes of this condition.  Denies any recent antibiotic use.  She denies history of nephrolithiasis, recurrent UTI, seeing urologist in the past, single kidney, recent urogenital procedure.   Past Medical History:  Diagnosis Date   Asthma    Bronchitis    GERD (gastroesophageal reflux disease)    High cholesterol    Hypertension    Pancreatitis    Tobacco abuse     Patient Active Problem List   Diagnosis Date Noted   Tonsillopharyngitis 07/28/2020   Acute lower UTI 07/28/2020   Dehydration 05/04/2019   Pancreatitis 05/04/2019   Hypokalemia 08/20/2018   Tobacco abuse 08/20/2018   Leukocytosis 08/20/2018   Alcohol abuse 84/69/6295   Alcoholic pancreatitis 28/41/3244   Abnormal LFTs 08/20/2018   Acute pancreatitis 08/20/2018   Asthma    Hypertension     History reviewed. No pertinent surgical history.  OB History   No obstetric history on file.      Home Medications    Prior to Admission medications   Medication Sig Start Date End Date Taking? Authorizing Provider  cephALEXin (KEFLEX) 500 MG capsule Take 1 capsule (500 mg total) by mouth 4 (four) times daily. 08/16/21  Yes Najla Aughenbaugh, Derry Skill, PA-C  acetaminophen (TYLENOL) 325 MG tablet Take 2 tablets (650 mg total) by mouth every 6 (six) hours as  needed. Patient taking differently: Take 650 mg by mouth every 6 (six) hours as needed (leg pain).  05/07/20   [name], [name], PA-C  ALPRAZolam [name]) 0.25 MG tablet Take 1 tablet (0.25 mg total) by mouth at bedtime as needed for anxiety or sleep. 03/09/21   [name], NP  amLODipine (NORVASC) 10 MG tablet TAKE 1 (ONE) TABLET (10 MG TOTAL) BY MOUTH DAILY. 07/14/21   [name], NP  atorvastatin (LIPITOR) 40 MG tablet Take 1 tablet (40 mg total) by mouth daily. 01/01/20   [name], NP  benzonatate (TESSALON) 100 MG capsule Take 1 capsule (100 mg total) by mouth every 8 (eight) hours. Patient not taking: Reported on 07/28/2020 05/07/20   [name], [name], PA-C  Blood Pressure Monitor KIT 1 Bag by Does not apply route 3 (three) times daily as needed. 09/26/19   [name], NP  budesonide (PULMICORT) 180 MCG/ACT inhaler Inhale 2 puffs into the lungs 2 (two) times daily. Patient not taking: Reported on 07/28/2020 09/26/19   [name], NP  cetirizine (ZYRTEC) 10 MG tablet TAKE 1 TABLET (10 MG TOTAL) BY MOUTH DAILY. 09/24/20   [name], NP  clindamycin (CLEOCIN) 300 MG capsule Take 1 capsule (300 mg total) by mouth 3 (three) times daily. 07/29/20   [name], NP  famotidine (PEPCID) 20 MG tablet TAKE 1 TABLET (20 MG TOTAL) BY MOUTH DAILY. 07/14/21   Oletta Lamas,  Milford Cage, NP  fluticasone (FLONASE) 50 MCG/ACT nasal spray Place 1 spray into both nostrils daily. 05/07/20   [name], [name], PA-C  guaiFENesin (ROBITUSSIN) 100 MG/5ML SOLN Take 5 mLs (100 mg total) by mouth every 4 (four) hours as needed for cough or to loosen phlegm. Patient not taking: Reported on 07/28/2020 05/07/20   [name], [name], PA-C  Olopatadine HCl 0.2 % SOLN Place 1 drop into both eyes every morning.  04/03/20   [provider]  phenazopyridine (PYRIDIUM) 100 MG tablet Take 1 tablet (100 mg total) by mouth 3 (three) times daily as needed for pain. 07/29/20   [name], NP  PROAIR  HFA 108 (90 Base) MCG/ACT inhaler INHALE 2 PUFFS INTO THE LUNGS EVERY 6 (SIX) HOURS AS NEEDED FOR WHEEZING OR SHORTNESS OF BREATH. Patient taking differently: Inhale 2 puffs into the lungs every 4 (four) hours as needed for wheezing or shortness of breath.  07/02/20   Charlott Rakes, MD  RESTASIS 0.05 % ophthalmic emulsion Place 1 drop into both eyes 2 (two) times daily. 02/28/20   [provider]  traZODone (DESYREL) 100 MG tablet TAKE ONE TABLET BY MOUTH DAILY AT BEDTIME. 07/14/21   [name], NP    Family History Family History  Problem Relation Age of Onset   Hypertension Mother    Hyperlipidemia Mother    Hypertension Father    Hyperlipidemia Father     Social History Social History   Tobacco Use   Smoking status: Every Day   Smokeless tobacco: Never  Substance Use Topics   Alcohol use: Yes    Comment: beer   Drug use: No     Allergies   Patient has no known allergies.   Review of Systems Review of Systems  Constitutional:  Positive for activity change. Negative for appetite change, fatigue and fever.  Respiratory:  Negative for cough and shortness of breath.   Cardiovascular:  Negative for chest pain.  Gastrointestinal:  Negative for abdominal pain, diarrhea, nausea and vomiting.  Genitourinary:  Positive for dysuria, frequency and urgency. Negative for vaginal bleeding, vaginal discharge and vaginal pain.  Neurological:  Negative for dizziness, light-headedness and headaches.    Physical Exam Triage Vital Signs ED Triage Vitals  Enc Vitals Group     BP 08/16/21 1339 (!) 147/68     Pulse Rate 08/16/21 1339 76     Resp 08/16/21 1339 17     Temp 08/16/21 1339 99 F (37.2 C)     Temp Source 08/16/21 1339 Oral     SpO2 08/16/21 1339 96 %     Weight --      Height --      Head Circumference --      Peak Flow --      Pain Score 08/16/21 1338 8     Pain Loc --      Pain Edu? --      Excl. in [place]? --    No data found.  Updated Vital  Signs BP (!) 147/68 (BP Location: Left Arm)   Pulse 76   Temp 99 F (37.2 C) (Oral)   Resp 17   SpO2 96%   Visual Acuity Right Eye Distance:   Left Eye Distance:   Bilateral Distance:    Right Eye Near:   Left Eye Near:    Bilateral Near:     Physical Exam Vitals reviewed.  Constitutional:      General: She is awake. She is not in acute  distress.    Appearance: Normal appearance. She is well-developed. She is not ill-appearing.     Comments: Very pleasant female appears stated age in no acute distress sitting comfortably in exam room  HENT:     Head: Normocephalic and atraumatic.  Cardiovascular:     Rate and Rhythm: Normal rate and regular rhythm.     Heart sounds: Normal heart sounds, S1 normal and S2 normal. No murmur heard. Pulmonary:     Effort: Pulmonary effort is normal.     Breath sounds: Normal breath sounds. No wheezing, rhonchi or rales.     Comments: Clear to auscultation bilaterally Abdominal:     General: Bowel sounds are normal.     Palpations: Abdomen is soft.     Tenderness: There is no abdominal tenderness. There is no right CVA tenderness, left CVA tenderness, guarding or rebound.     Comments: Benign abdominal exam.  No CVA tenderness.  Genitourinary:    Comments: Exam deferred Psychiatric:        Behavior: Behavior is cooperative.     UC Treatments / Results  Labs (all labs ordered are listed, but only abnormal results are displayed) Labs Reviewed  POCT URINALYSIS DIPSTICK, ED / UC - Abnormal; Notable for the following components:      Result Value   Bilirubin Urine SMALL (*)    Ketones, ur TRACE (*)    Hgb urine dipstick MODERATE (*)    Protein, ur 100 (*)    Urobilinogen, UA 2.0 (*)    Nitrite POSITIVE (*)    Leukocytes,Ua SMALL (*)    All other components within normal limits  URINE CULTURE    EKG   Radiology No results found.  Procedures Procedures (including critical care time)  Medications Ordered in UC Medications - No  data to display  Initial Impression / Assessment and Plan / UC Course  I have reviewed the triage vital signs and the nursing notes.  Pertinent labs & imaging results that were available during my care of the patient were reviewed by me and considered in my medical decision making (see chart for details).     UA consistent with UTI; low suspicion for pyelonephritis given no CVA tenderness on exam and vital signs are normal in clinic today.  Patient was started on Keflex 500 mg 4 times a day for 7 days.  Urine culture was obtained-results pending.  Discussed potentially change antibiotics based on susceptibilities identified on culture.  She was encouraged to rest and drink plenty of fluid.  She was provided a work excuse note.  Discussed that she was noted to have hematuria which is likely related to infection but that UA should be rechecked in a few weeks to ensure hematuria resolves with clearing of infection.  She is to follow-up with her PCP for further evaluation and management.  Discussed alarm symptoms that warrant emergent evaluation.  Strict return precautions given to which she expressed understanding.  Final Clinical Impressions(s) / UC Diagnoses   Final diagnoses:  Acute cystitis with hematuria  Urinary frequency  Dysuria     Discharge Instructions      Urine appears that you have a urinary tract infection.  We are starting you on Keflex 4 times a day.  Make sure to drink plenty of water.  We are sending her urine off for culture if any change antibiotics we will contact you to schedule an appointment.  I do recommend you follow-up with your PCP within a few weeks to  ensure that blood noted on dipstick today resolves once we clear the infection.  If you have any worsening symptoms including fever, nausea, vomiting, abdominal pain, blood in your urine, fatigue, malaise you need to be seen immediately.     ED Prescriptions     Medication Sig Dispense Auth. Provider    cephALEXin (KEFLEX) 500 MG capsule Take 1 capsule (500 mg total) by mouth 4 (four) times daily. 28 capsule Pat Sires K, PA-C      PDMP not reviewed this encounter.   Terrilee Croak, PA-C 08/16/21 1411

## 2021-08-16 NOTE — ED Triage Notes (Signed)
Pt c/o right flank pain and urinary frequency and urgency since Friday.

## 2021-08-16 NOTE — Telephone Encounter (Signed)
Sent to PCP ?

## 2021-08-16 NOTE — ED Triage Notes (Signed)
Pt adds that she has had sinus congestion for a couple months

## 2021-08-16 NOTE — Discharge Instructions (Signed)
Urine appears that you have a urinary tract infection.  We are starting you on Keflex 4 times a day.  Make sure to drink plenty of water.  We are sending her urine off for culture if any change antibiotics we will contact you to schedule an appointment.  I do recommend you follow-up with your PCP within a few weeks to ensure that blood noted on dipstick today resolves once we clear the infection.  If you have any worsening symptoms including fever, nausea, vomiting, abdominal pain, blood in your urine, fatigue, malaise you need to be seen immediately.

## 2021-08-18 LAB — URINE CULTURE: Culture: >=100000 — AB

## 2021-08-26 ENCOUNTER — Other Ambulatory Visit (INDEPENDENT_AMBULATORY_CARE_PROVIDER_SITE_OTHER): Payer: Self-pay | Admitting: Primary Care

## 2021-08-26 DIAGNOSIS — K219 Gastro-esophageal reflux disease without esophagitis: Secondary | ICD-10-CM

## 2021-08-26 NOTE — Telephone Encounter (Signed)
Requested medication (s) are due for refill today:   Yes  Requested medication (s) are on the active medication list:   Yes  Future visit scheduled:   No  Looks like in chart that she has been notified back to July that she needs an appt but she hasn't called in and made an appt yet.   Last ordered: All 3 meds were last refilled for 30 days without refills on 07/14/2021  Returned for [place] to review for refills.   Requested Prescriptions  Pending Prescriptions Disp Refills   amLODipine (NORVASC) 10 MG tablet [Pharmacy Med Name: AMLODIPINE BESYLATE 10 MG ORAL TABLET] 30 tablet 0    Sig: TAKE 1 (ONE) TABLET (10 MG TOTAL) BY MOUTH DAILY.     Cardiovascular:  Calcium Channel Blockers Failed - 08/26/2021 11:18 AM      Failed - Last BP in normal range    BP Readings from Last 1 Encounters:  08/16/21 (!) 147/68          Failed - Valid encounter within last 6 months    Recent Outpatient Visits           1 year ago Essential hypertension   [facility] Grayce Sessions, NP   1 year ago Hypertension, unspecified type   [facility] Grayce Sessions, NP   2 years ago Need for Tdap vaccination   [facility] Grayce Sessions, NP   3 years ago Hypertension, unspecified type   [facility] Loletta Specter, PA-C   4 years ago Dysuria   [facility] Loletta Specter, PA-C               traZODone (DESYREL) 100 MG tablet [Pharmacy Med Name: TRAZODONE HCL 100 MG ORAL TABLET] 30 tablet 0    Sig: TAKE ONE TABLET BY MOUTH DAILY AT BEDTIME.     Psychiatry: Antidepressants - Serotonin Modulator Failed - 08/26/2021 11:18 AM      Failed - Valid encounter within last 6 months    Recent Outpatient Visits           1 year ago Essential hypertension   [facility] Grayce Sessions, NP   1 year ago Hypertension, unspecified type   [facility]  [facility] Grayce Sessions, NP   2 years ago Need for Tdap vaccination   [facility] Grayce Sessions, NP   3 years ago Hypertension, unspecified type   [facility] Loletta Specter, PA-C   4 years ago Dysuria   [facility] Loletta Specter, PA-C               famotidine (PEPCID) 20 MG tablet [Pharmacy Med Name: FAMOTIDINE 20 MG ORAL TABLET] 30 tablet 0    Sig: TAKE 1 TABLET (20 MG TOTAL) BY MOUTH DAILY.     Gastroenterology:  H2 Antagonists Failed - 08/26/2021 11:18 AM      Failed - Valid encounter within last 12 months    Recent Outpatient Visits           1 year ago Essential hypertension   [facility] Grayce Sessions, NP   1 year ago Hypertension, unspecified type   [facility] Grayce Sessions, NP   2 years ago Need for Tdap vaccination   [facility] Grayce Sessions, NP   3 years ago Hypertension, unspecified type   [facility] Loletta Specter, PA-C   4 years ago Dysuria   [facility] Loletta Specter, PA-C

## 2021-09-13 ENCOUNTER — Ambulatory Visit (INDEPENDENT_AMBULATORY_CARE_PROVIDER_SITE_OTHER): Payer: Self-pay | Admitting: *Deleted

## 2021-09-13 NOTE — Telephone Encounter (Signed)
Requested medications are due for refill today.  yes  Requested medications are on the active medications list.  yes  Last refill. 07/14/2021  Future visit scheduled.   Yes - 09/28/2021  Notes to clinic.  Pt is requesting refills to get her to her appointment later this month. Please evaluate.    Requested Prescriptions  Pending Prescriptions Disp Refills   famotidine (PEPCID) 20 MG tablet 30 tablet 0    Sig: Take 1 tablet (20 mg total) by mouth daily.     Gastroenterology:  H2 Antagonists Failed - 09/13/2021 10:14 AM      Failed - Valid encounter within last 12 months    Recent Outpatient Visits           1 year ago Essential hypertension   [facility] Grayce Sessions, NP   1 year ago Hypertension, unspecified type   [facility] Grayce Sessions, NP   2 years ago Need for Tdap vaccination   [facility] Grayce Sessions, NP   3 years ago Hypertension, unspecified type   [facility] Loletta Specter, PA-C   4 years ago Dysuria   [facility] Loletta Specter, PA-C       Future Appointments             In 2 weeks Grayce Sessions, NP [facility]             amLODipine (NORVASC) 10 MG tablet 30 tablet 0     Cardiovascular:  Calcium Channel Blockers Failed - 09/13/2021 10:14 AM      Failed - Last BP in normal range    BP Readings from Last 1 Encounters:  08/16/21 (!) 147/68          Failed - Valid encounter within last 6 months    Recent Outpatient Visits           1 year ago Essential hypertension   [facility] Grayce Sessions, NP   1 year ago Hypertension, unspecified type   [facility] Grayce Sessions, NP   2 years ago Need for Tdap vaccination   [facility] Grayce Sessions, NP   3 years ago Hypertension, unspecified type   [facility]  [facility] Loletta Specter, PA-C   4 years ago Dysuria   [facility] Loletta Specter, PA-C       Future Appointments             In 2 weeks Grayce Sessions, NP [facility]             traZODone (DESYREL) 100 MG tablet 30 tablet 0    Sig: Take 1 tablet (100 mg total) by mouth at bedtime.     Psychiatry: Antidepressants - Serotonin Modulator Failed - 09/13/2021 10:14 AM      Failed - Valid encounter within last 6 months    Recent Outpatient Visits           1 year ago Essential hypertension   [facility] Grayce Sessions, NP   1 year ago Hypertension, unspecified type   [facility] Grayce Sessions, NP   2 years ago Need for Tdap vaccination   [facility] Grayce Sessions, NP  3 years ago Hypertension, unspecified type   [facility] Loletta SpecterGomez, Roger David, PA-C   4 years ago Dysuria   [facility] Loletta SpecterGomez, Roger David, PA-C       Future Appointments             In 2 weeks Grayce SessionsEdwards, Michelle P, NP [facility]            Refused Prescriptions Disp Refills   amLODipine (NORVASC) 10 MG tablet [Pharmacy Med Name: AMLODIPINE BESYLATE 10 MG ORAL TABLET] 30 tablet 0    Sig: TAKE 1 (ONE) TABLET (10 MG TOTAL) BY MOUTH DAILY.     Cardiovascular:  Calcium Channel Blockers Failed - 09/13/2021 10:14 AM      Failed - Last BP in normal range    BP Readings from Last 1 Encounters:  08/16/21 (!) 147/68          Failed - Valid encounter within last 6 months    Recent Outpatient Visits           1 year ago Essential hypertension   [facility] Grayce SessionsEdwards, Michelle P, NP   1 year ago Hypertension, unspecified type   [facility] Grayce SessionsEdwards, Michelle P, NP   2 years ago Need for Tdap vaccination   [facility] Grayce SessionsEdwards, Michelle P, NP   3 years ago Hypertension, unspecified type   [facility] Loletta SpecterGomez, Roger David, PA-C   4 years ago Dysuria   [facility] Loletta SpecterGomez, Roger David, PA-C       Future Appointments             In 2 weeks [facility] SessionsEdwards, Michelle P, [facility]             traZODone (DESYREL) 100 MG tablet [Pharmacy Med Name: TRAZODONE HCL 100 MG ORAL TABLET] 30 tablet 0    Sig: TAKE ONE TABLET BY MOUTH DAILY AT BEDTIME.     Psychiatry: Antidepressants - Serotonin Modulator Failed - 09/13/2021 10:14 AM      Failed - Valid encounter within last 6 months    Recent Outpatient Visits           1 year ago Essential hypertension   [facility] Grayce SessionsEdwards, Michelle P, NP   1 year ago Hypertension, unspecified type   [facility] Grayce SessionsEdwards, Michelle P, NP   2 years ago Need for Tdap vaccination   [facility] Grayce SessionsEdwards, Michelle P, NP   3 years ago Hypertension, unspecified type   [facility] Loletta SpecterGomez, Roger David, PA-C   4 years ago Dysuria   [facility] Loletta SpecterGomez, Roger David, PA-C       Future Appointments             In 2 weeks Grayce SessionsEdwards, Michelle P, [facility]             famotidine (PEPCID) 20 MG tablet [Pharmacy Med Name: FAMOTIDINE 20 MG ORAL TABLET] 30 tablet 0    Sig: TAKE 1 TABLET (20 MG TOTAL) BY MOUTH DAILY.     Gastroenterology:  H2 Antagonists Failed - 09/13/2021 10:14 AM      Failed - Valid encounter within last 12 months    Recent Outpatient Visits  1 year ago Essential hypertension   [facility] Grayce Sessions, NP   1 year ago Hypertension, unspecified type   [facility] Grayce Sessions, NP   2 years ago Need for Tdap vaccination   [facility] Grayce Sessions, NP    3 years ago Hypertension, unspecified type   [facility] Loletta Specter, PA-C   4 years ago Dysuria   [facility] Loletta Specter, PA-C       Future Appointments             In 2 weeks Grayce Sessions, NP [facility]

## 2021-09-13 NOTE — Addendum Note (Signed)
Addended by: Elby Beck F on: 09/13/2021 10:14 AM   Modules accepted: Orders

## 2021-09-13 NOTE — Telephone Encounter (Signed)
Pt following on refill request for these medications.  Pt has made appt for 09/28/21. Pt would like enough medication to get her through to appt.   Pt keeps repeating: "I need my bp  medication," I have a headache! "   I explained she has missed her last 2 appointments, but as soon as I can reschedule you, we can ask for that medication Clinical placed to NT.  amLODipine (NORVASC) 10 MG tablet famotidine (PEPCID) 20 MG tablet traZODone (DESYREL) 100 MG tablet [facility] - [place], [place] - [address]

## 2021-09-13 NOTE — Telephone Encounter (Signed)
Summary: out of bp mediation/headache   Pt called to get refill on her amlodipine. Pt not seen since 12/2019. Explained to pt she needed appt. However, she keeps repeating this.  Then she said well I have a headache.  I asked her if she knew what her bp was.  She said, no I do not know how to do any of that.  Pt seems a little agitated. Appt made, but I thought she may need to speak w/ a nurse b/c appt not until 09/28/21        Chief Complaint: ran out of BP medications and wants refill . Has not taken in 4 days  Symptoms: headache, dizziness, blurred vision, reports hx toes numb on bilateral feet  Frequency: since not taking BP medications 4 days ago  Pertinent Negatives: Patient denies na  Disposition: '[]' ED /'[]' Urgent Care (no appt availability in office) / '[x]' Appointment(In office/virtual)/ '[]'  [place] [facility]/ '[]' Home Care/ '[]' Refused Recommended Disposition /'[]' [place] [facility]/ '[]'  Follow-up with PCP Additional Notes:  Patient declined to take BP during encounter.  Reports she does have BP monitor kit and just wants refill of her medication amlodipine. Instructed patient to take BP and report if greater then 160/80 . Earliest appt 09/28/21. Recommended UC or ED if symptoms worsen. Please advise if earlier visit available .      Reason for Disposition  Ran out of BP medications  Answer Assessment - Initial Assessment Questions 1. BLOOD PRESSURE: "What is the blood pressure?" "Did you take at least two measurements 5 minutes apart?"     Patient did not want to take for NT  2. ONSET: "When did you take your blood pressure?"     Did not take  3. HOW: "How did you obtain the blood pressure?" (e.g., visiting nurse, automatic home BP monitor)     Has BP monitor Kit ordered and has at home, patient reports she can not see it well 4. HISTORY: "Do you have a history of high blood pressure?"     yes 5. MEDICATIONS: "Are you taking any medications for blood pressure?" "Have you missed  any doses recently?"     Amlodipine  6. OTHER SYMPTOMS: "Do you have any symptoms?" (e.g., headache, chest pain, blurred vision, difficulty breathing, weakness)     Headache, dizziness, blurred vision, toes bilateral feet numb 7. PREGNANCY: "Is there any chance you are pregnant?" "When was your last menstrual period?"     na  Protocols used: Blood Pressure - High-A-AH

## 2021-09-15 ENCOUNTER — Ambulatory Visit (INDEPENDENT_AMBULATORY_CARE_PROVIDER_SITE_OTHER): Payer: Self-pay

## 2021-09-15 NOTE — Telephone Encounter (Signed)
Summary: reflux vomiting/BP medication   Pt stated she is not feeling well stated she has a lot of acid reflux vomiting when waking up from her sleep stated she feels very tired. Requesting medication. Pt unsure of medication name. Pt stated she spoke with a nurse regarding her BP medication on 09/13/2021. Pt unsure of the name and has not gotten an update about the medication refill. Stated she has been out of medication since around Christmas time.   Pt stated her BP currently is Top 153 Middle 65 bottom 83   ( Unsure what pt means by middle number)   Seeking clinical advice.   No same day appt avail. Pt has appt on 01/24       Chief Complaint: Out of BP medication and Pepcid since Christmas. Symptoms: Headache and sometimes blurred vision. BP today 153/65.Instructed to go to ED for worsening of symptoms. Frequency: Started last week. Pertinent Negatives: Patient denies chest pain or other symptoms. Disposition: [] ED /[] Urgent Care (no appt availability in office) / [] Appointment(In office/virtual)/ []  [facility]/ [] Home Care/ [] Refused Recommended Disposition /[] [facility]/ []  Follow-up with PCP Additional Notes: Pt. Asking for refills until her appointment. Please advise.  Answer Assessment - Initial Assessment Questions 1. BLOOD PRESSURE: "What is the blood pressure?" "Did you take at least two measurements 5 minutes apart?"     153/65 2. ONSET: "When did you take your blood pressure?"     Today 3. HOW: "How did you obtain the blood pressure?" (e.g., visiting nurse, automatic home BP monitor)     Home cuff 4. HISTORY: "Do you have a history of high blood pressure?"     Yes 5. MEDICATIONS: "Are you taking any medications for blood pressure?" "Have you missed any doses recently?"     Yes. Has been out of BP and Pepcid since Christmas 6. OTHER SYMPTOMS: "Do you have any symptoms?" (e.g., headache, chest pain, blurred vision, difficulty breathing, weakness)      Headache and sometimes blurred vision 7. PREGNANCY: "Is there any chance you are pregnant?" "When was your last menstrual period?"     No  Protocols used: Blood Pressure - High-A-AH

## 2021-09-15 NOTE — Telephone Encounter (Signed)
Patient advised to continue monitoring/documenting BP. If BP or acid reflux become worse go to UC as office does not have any open availability before her scheduled appointment.

## 2021-09-22 ENCOUNTER — Other Ambulatory Visit: Payer: Self-pay

## 2021-09-22 ENCOUNTER — Ambulatory Visit (HOSPITAL_COMMUNITY)
Admission: EM | Admit: 2021-09-22 | Discharge: 2021-09-22 | Disposition: A | Discharge status: Home / Self Care | Payer: Medicaid Other | Attending: Family Medicine | Admitting: Family Medicine

## 2021-09-22 ENCOUNTER — Encounter (HOSPITAL_COMMUNITY): Payer: Self-pay | Admitting: Emergency Medicine

## 2021-09-22 DIAGNOSIS — N3090 Cystitis, unspecified without hematuria: Secondary | ICD-10-CM | POA: Diagnosis not present

## 2021-09-22 LAB — POCT URINALYSIS DIPSTICK, ED / UC
Glucose, UA: 100 mg/dL — AB
Ketones, ur: 40 mg/dL — AB
Nitrite: POSITIVE — AB
Protein, ur: >=300 mg/dL — AB
Specific Gravity, Urine: 1.01 (ref 1.005–1.030)
Urobilinogen, UA: >=8 mg/dL (ref 0.0–1.0)
pH: 8.5 — ABNORMAL HIGH (ref 5.0–8.0)

## 2021-09-22 LAB — POC URINE PREG, ED: Preg Test, Ur: NEGATIVE

## 2021-09-22 MED ORDER — CEPHALEXIN 500 MG PO CAPS: 500.0000 mg | ORAL_CAPSULE | Freq: Two times a day (BID) | ORAL | 0 refills | Status: DC

## 2021-09-22 MED ORDER — PHENAZOPYRIDINE HCL 200 MG PO TABS: 200.0000 mg | ORAL_TABLET | Freq: Three times a day (TID) | ORAL | 0 refills | Status: DC

## 2021-09-22 NOTE — ED Triage Notes (Signed)
Pt reports pelvic/lower abd pains that started today and passing blood and blood clots when urinating.

## 2021-09-22 NOTE — ED Notes (Signed)
Called phone number, no answer.

## 2021-09-22 NOTE — Discharge Instructions (Signed)
You have had a urine culture sent today. We will call you with any significant abnormalities or if there is need to begin or change treatment or pursue further follow up.  You may also review your test results online through MyChart. If you do not have a MyChart account, instructions to sign up should be on your discharge paperwork.  

## 2021-09-22 NOTE — ED Provider Notes (Signed)
[facility]    ASSESSMENT & PLAN:  1. Cystitis    Begin: Meds ordered this encounter  Medications   cephALEXin (KEFLEX) 500 MG capsule    Sig: Take 1 capsule (500 mg total) by mouth 2 (two) times daily.    Dispense:  10 capsule    Refill:  0   phenazopyridine (PYRIDIUM) 200 MG tablet    Sig: Take 1 tablet (200 mg total) by mouth 3 (three) times daily.    Dispense:  6 tablet    Refill:  0   No signs of pyelonephritis. Discussed. Tolerating PO fluid intake without n/v. Urine culture sent. Will notify patient of any tx changes, if needed. Will follow up with her PCP or here if not showing improvement over the next 48 hours, sooner if needed.  Outlined signs and symptoms indicating need for more acute intervention. Patient verbalized understanding. After Visit Summary given.  SUBJECTIVE:  Robin Fischer is a 51 y.o. female who complains of urinary frequency, urgency and dysuria for the past 12 hours. Without associated flank pain, fever, chills, vaginal discharge or bleeding. Gross hematuria: present. No specific aggravating or alleviating factors reported. No LE edema. Normal PO intake without n/v/d. Without specific abdominal pain. Ambulatory without difficulty. OTC treatment: none. H/O UTI: infrequent.  LMP: No LMP recorded. Patient is postmenopausal.  OBJECTIVE:  Vitals:   09/22/21 1411  BP: (!) 126/57  Pulse: 74  Resp: 19  Temp: 98.5 F (36.9 C)  SpO2: 96%   General appearance: alert; no distress HENT: oropharynx: moist Lungs: unlabored respirations Abdomen: benign Back: no CVA tenderness Extremities: no edema; symmetrical with no gross deformities Skin: warm and dry Neurologic: normal gait Psychological: alert and cooperative; normal mood and affect  Labs Reviewed  POCT URINALYSIS DIPSTICK, ED / UC - Abnormal; Notable for the following components:      Result Value   Glucose, UA 100 (*)    Bilirubin Urine LARGE (*)    Ketones, ur 40 (*)     Hgb urine dipstick LARGE (*)    pH 8.5 (*)    Protein, ur >=300 (*)    Nitrite POSITIVE (*)    Leukocytes,Ua LARGE (*)    All other components within normal limits  POC URINE PREG, ED    No Known Allergies  Past Medical History:  Diagnosis Date   Asthma    Bronchitis    GERD (gastroesophageal reflux disease)    High cholesterol    Hypertension    Pancreatitis    Tobacco abuse    Social History   Socioeconomic History   Marital status: Single    Spouse name: Not on file   Number of children: Not on file   Years of education: Not on file   Highest education level: Not on file  Occupational History   Not on file  Tobacco Use   Smoking status: Every Day   Smokeless tobacco: Never  Substance and Sexual Activity   Alcohol use: Yes    Comment: beer   Drug use: No   Sexual activity: Not on file  Other Topics Concern   Not on file  Social History Narrative   Not on file   Social Determinants of Health   Financial Resource Strain: Not on file  Food Insecurity: Not on file  Transportation Needs: Not on file  Physical Activity: Not on file  Stress: Not on file  Social Connections: Not on file  Intimate Partner Violence: Not on file  Family History  Problem Relation Age of Onset   Hypertension Mother    Hyperlipidemia Mother    Hypertension Father    Hyperlipidemia Father         Vanessa Kick, MD 09/22/21 [phone]

## 2021-09-23 ENCOUNTER — Encounter (HOSPITAL_COMMUNITY): Payer: Self-pay

## 2021-09-23 ENCOUNTER — Other Ambulatory Visit: Payer: Self-pay

## 2021-09-23 ENCOUNTER — Emergency Department (HOSPITAL_COMMUNITY): Payer: Medicaid Other

## 2021-09-23 ENCOUNTER — Observation Stay (HOSPITAL_COMMUNITY)
Admission: EM | Admit: 2021-09-23 | Discharge: 2021-09-25 | Disposition: A | Discharge status: Home / Self Care | Payer: Medicaid Other | Attending: Urology | Admitting: Urology

## 2021-09-23 DIAGNOSIS — J45909 Unspecified asthma, uncomplicated: Secondary | ICD-10-CM | POA: Diagnosis present

## 2021-09-23 DIAGNOSIS — R310 Gross hematuria: Principal | ICD-10-CM | POA: Insufficient documentation

## 2021-09-23 DIAGNOSIS — N361 Urethral diverticulum: Secondary | ICD-10-CM | POA: Diagnosis not present

## 2021-09-23 DIAGNOSIS — N2889 Other specified disorders of kidney and ureter: Secondary | ICD-10-CM

## 2021-09-23 DIAGNOSIS — I10 Essential (primary) hypertension: Secondary | ICD-10-CM | POA: Diagnosis present

## 2021-09-23 DIAGNOSIS — Z20822 Contact with and (suspected) exposure to covid-19: Secondary | ICD-10-CM | POA: Diagnosis not present

## 2021-09-23 DIAGNOSIS — R0600 Dyspnea, unspecified: Secondary | ICD-10-CM

## 2021-09-23 DIAGNOSIS — Z720 Tobacco use: Secondary | ICD-10-CM | POA: Diagnosis present

## 2021-09-23 LAB — CBC
HCT: 42.4 % (ref 36.0–46.0)
Hemoglobin: 15.4 g/dL — ABNORMAL HIGH (ref 12.0–15.0)
MCH: 30.9 pg (ref 26.0–34.0)
MCHC: 36.3 g/dL — ABNORMAL HIGH (ref 30.0–36.0)
MCV: 85 fL (ref 80.0–100.0)
Platelets: 293 10*3/uL (ref 150–400)
RBC: 4.99 MIL/uL (ref 3.87–5.11)
RDW: 14.4 % (ref 11.5–15.5)
WBC: 14.7 10*3/uL — ABNORMAL HIGH (ref 4.0–10.5)
nRBC: 0.1 % (ref 0.0–0.2)

## 2021-09-23 LAB — BASIC METABOLIC PANEL
Anion gap: 12 (ref 5–15)
BUN: 7 mg/dL (ref 6–20)
CO2: 25 mmol/L (ref 22–32)
Calcium: 9.5 mg/dL (ref 8.9–10.3)
Chloride: 100 mmol/L (ref 98–111)
Creatinine, Ser: 0.52 mg/dL (ref 0.44–1.00)
GFR, Estimated: >60 mL/min (ref >60)
Glucose, Bld: 108 mg/dL — ABNORMAL HIGH (ref 70–99)
Potassium: 3.5 mmol/L (ref 3.5–5.1)
Sodium: 137 mmol/L (ref 135–145)

## 2021-09-23 MED ORDER — LIDOCAINE HCL URETHRAL/MUCOSAL 2 % EX GEL
1.0000 | Freq: Once | CUTANEOUS | Status: AC
Start: 2021-09-23 — End: 2021-09-23
  Administered 2021-09-23: 1 via URETHRAL
  Filled 2021-09-23: qty 11

## 2021-09-23 MED ORDER — LIDOCAINE HCL URETHRAL/MUCOSAL 2 % EX GEL
1.0000 "application " | Freq: Once | CUTANEOUS | Status: AC
  Administered 2021-09-23: 1 via URETHRAL
  Filled 2021-09-23: qty 11

## 2021-09-23 NOTE — ED Provider Triage Note (Signed)
Emergency Medicine Provider Triage Evaluation Note  Robin Fischer , a 51 y.o. female  was evaluated in triage.  Pt complains of hematuria for two days. She states that she is urinating large blood clots. Yesterday she was seen by Urgent Care and diagnosed with a UTI. She has been on Keflex. Today she states that the clots are so large that she is unable to urinate. She has not urinated since 11 am but feels like she needs to. She complains of right flank pain and suprapubic pressure. Denies fevers or chills. Denies bleeding from vagina.   Review of Systems  Positive: Hematuria, pelvic pain, flank pain Negative:   Physical Exam  BP (!) 137/100    Pulse 80    Temp 98.2 F (36.8 C) (Oral)    Resp 18    SpO2 97%  Gen:   Awake, no distress   Resp:  Normal effort  MSK:   Moves extremities without difficulty  Other:  R CVA ttp, Suprapubic ttp, no guarding or rigidity.   Medical Decision Making  Medically screening exam initiated at 3:07 PM.  Appropriate orders placed.  Robin Fischer was informed that the remainder of the evaluation will be completed by another provider, this initial triage assessment does not replace that evaluation, and the importance of remaining in the ED until their evaluation is complete.  Bladder scan. UA, labs, CT stone study ordered.    Adolphus Birchwood, [place] 09/23/21 1514

## 2021-09-23 NOTE — ED Provider Notes (Signed)
[facility] DEPT Provider Note   CSN: [id number] Arrival date & time: 09/23/21  1423     History  Chief Complaint  Patient presents with   Hematuria    Robin Fischer is a 51 y.o. female who presents today for evaluation of blood in her urine. She states that yesterday she started having frank blood in her urine and significant pain.  She denies any flank pain.  She states that about every 15 minutes she is passing large clots through her urethra.  She has not had a menstrual cycle in 11 years and denies any possibility of vaginal bleeding.  She states that she has not had any trauma.  No history of similar.  She denies any recent catheterizations or procedures.  She went to urgent care yesterday for reports of frequency urgency and dysuria according to note.  She was given Pyridium and started on Keflex.  She shows me a picture of the toilet bowl with what appears to be straight blood with large clots in it.    HPI     Home Medications Prior to Admission medications   Medication Sig Start Date End Date Taking? Authorizing Provider  acetaminophen (TYLENOL) 325 MG tablet Take 2 tablets (650 mg total) by mouth every 6 (six) hours as needed. Patient taking differently: Take 650 mg by mouth every 6 (six) hours as needed (leg pain).  05/07/20   [name], [name], PA-C  ALPRAZolam [name]) 0.25 MG tablet Take 1 tablet (0.25 mg total) by mouth at bedtime as needed for anxiety or sleep. 03/09/21   [name], NP  amLODipine (NORVASC) 10 MG tablet TAKE 1 (ONE) TABLET (10 MG TOTAL) BY MOUTH DAILY. 07/14/21   [name], NP  atorvastatin (LIPITOR) 40 MG tablet Take 1 tablet (40 mg total) by mouth daily. 01/01/20   [name], NP  benzonatate (TESSALON) 100 MG capsule Take 1 capsule (100 mg total) by mouth every 8 (eight) hours. Patient not taking: Reported on 07/28/2020 05/07/20   [name], [name], PA-C  Blood Pressure Monitor KIT 1 Bag by Does not  apply route 3 (three) times daily as needed. 09/26/19   [name], NP  budesonide (PULMICORT) 180 MCG/ACT inhaler Inhale 2 puffs into the lungs 2 (two) times daily. Patient not taking: Reported on 07/28/2020 09/26/19   [name], NP  cephALEXin (KEFLEX) 500 MG capsule Take 1 capsule (500 mg total) by mouth 2 (two) times daily. 09/22/21   Vanessa Kick, MD  cetirizine (ZYRTEC) 10 MG tablet TAKE 1 TABLET (10 MG TOTAL) BY MOUTH DAILY. 09/24/20   [name], NP  clindamycin (CLEOCIN) 300 MG capsule Take 1 capsule (300 mg total) by mouth 3 (three) times daily. 07/29/20   [name], NP  famotidine (PEPCID) 20 MG tablet TAKE 1 TABLET (20 MG TOTAL) BY MOUTH DAILY. 07/14/21   [name], NP  fluticasone (FLONASE) 50 MCG/ACT nasal spray Place 1 spray into both nostrils daily. 05/07/20   [name], [name], PA-C  guaiFENesin (ROBITUSSIN) 100 MG/5ML SOLN Take 5 mLs (100 mg total) by mouth every 4 (four) hours as needed for cough or to loosen phlegm. Patient not taking: Reported on 07/28/2020 05/07/20   [name], [name], PA-C  Olopatadine HCl 0.2 % SOLN Place 1 drop into both eyes every morning.  04/03/20   [provider]  phenazopyridine (PYRIDIUM) 100 MG tablet Take 200 mg by mouth 3 (three) times daily. 09/22/21   [provider]  phenazopyridine (PYRIDIUM) 200 MG tablet Take 1 tablet (200 mg total) by mouth 3 (three) times daily. 09/22/21   Vanessa Kick, MD  PROAIR HFA 108 [id number] Base) MCG/ACT inhaler INHALE 2 PUFFS INTO THE LUNGS EVERY 6 (SIX) HOURS AS NEEDED FOR WHEEZING OR SHORTNESS OF BREATH. Patient taking differently: Inhale 2 puffs into the lungs every 4 (four) hours as needed for wheezing or shortness of breath.  07/02/20   Charlott Rakes, MD  RESTASIS 0.05 % ophthalmic emulsion Place 1 drop into both eyes 2 (two) times daily. 02/28/20   [provider]  traZODone (DESYREL) 100 MG tablet TAKE ONE TABLET BY MOUTH DAILY AT BEDTIME. 07/14/21   [name], NP      Allergies    Patient has no known allergies.    Review of Systems   Review of Systems See HPI Physical Exam Updated Vital Signs BP (!) 131/54    Pulse 81    Temp 98.2 F (36.8 C) (Oral)    Resp 18    SpO2 99%  Physical Exam Vitals and nursing note reviewed.  Constitutional:      General: She is not in acute distress.    Appearance: She is not diaphoretic.  HENT:     Head: Normocephalic and atraumatic.  Eyes:     General: No scleral icterus.       Right eye: No discharge.        Left eye: No discharge.     Conjunctiva/sclera: Conjunctivae normal.  Cardiovascular:     Rate and Rhythm: Normal rate and regular rhythm.  Pulmonary:     Effort: Pulmonary effort is normal. No respiratory distress.     Breath sounds: No stridor.  Abdominal:     General: There is no distension.  Musculoskeletal:        General: No deformity.     Cervical back: Normal range of motion.  Skin:    General: Skin is warm and dry.  Neurological:     Mental Status: She is alert.     Motor: No abnormal muscle tone.  Psychiatric:        Behavior: Behavior normal.    ED Results / Procedures / Treatments   Labs (all labs ordered are listed, but only abnormal results are displayed) Labs Reviewed  BASIC METABOLIC PANEL - Abnormal; Notable for the following components:      Result Value   Glucose, Bld 108 (*)    All other components within normal limits  CBC - Abnormal; Notable for the following components:   WBC 14.7 (*)    Hemoglobin 15.4 (*)    MCHC 36.3 (*)    All other components within normal limits  URINALYSIS, ROUTINE W REFLEX MICROSCOPIC    EKG None  Radiology CT Renal Stone Study  Result Date: 09/23/2021 CLINICAL DATA:  Hematuria for 2 days, urinary tract infection EXAM: CT ABDOMEN AND PELVIS WITHOUT CONTRAST TECHNIQUE: Multidetector CT imaging of the abdomen and pelvis was performed following the standard protocol without IV contrast. RADIATION DOSE REDUCTION:  This exam was performed according to the departmental dose-optimization program which includes automated exposure control, adjustment of the mA and/or kV according to patient size and/or use of iterative reconstruction technique. COMPARISON:  05/04/2019 FINDINGS: Lower chest: No acute pleural or parenchymal lung disease. Hepatobiliary: Large geographic areas of decreased attenuation throughout the liver parenchyma are nonspecific on this unenhanced exam, but likely reflect hepatic steatosis. No intrahepatic duct dilation. The gallbladder is unremarkable. Pancreas: Unremarkable unenhanced  appearance. Spleen: Unremarkable unenhanced appearance. Adrenals/Urinary Tract: No urinary tract calculi or obstructive uropathy within either kidney. The bladder is moderately distended. High attenuation material is seen posteriorly along the bladder wall, measuring up to 1 cm in thickness. Given clinical history of gross hematuria, this could reflect blood clot within the bladder lumen. Underlying mucosal lesion cannot be excluded, and cystoscopy is recommended. 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 Again, while this likely reflects blood products, underlying mass cannot be excluded. The adrenals are unremarkable. Stomach/Bowel: No bowel obstruction or ileus. Normal appendix right lower quadrant. No bowel wall thickening or inflammatory change. Vascular/Lymphatic: Aortic atherosclerosis. No enlarged abdominal or pelvic lymph nodes. Reproductive: Uterus and bilateral adnexa are unremarkable. Other: No free fluid or free intraperitoneal gas. No abdominal wall hernia. Musculoskeletal: No acute or destructive bony lesions. Reconstructed images demonstrate no additional findings. IMPRESSION: 1. High attenuation material within the posterior aspect of the bladder, likely representing blood products layering dependently. Underlying  mucosal lesion cannot be excluded. Cystoscopy is recommended. 2. Large urethrocele again noted, now with high attenuation material similar to the bladder lumen compatible with blood products. Urologic consultation may be useful. 3. No urinary tract calculi or obstructive uropathy. 4. Geographic decreased attenuation of the liver parenchyma, nonspecific on this unenhanced exam but likely reflecting hepatic steatosis. 5.  Aortic Atherosclerosis (ICD10-I70.0). Electronically Signed   By: Randa Ngo M.D.   On: 09/23/2021 15:48    Procedures Procedures    Medications Ordered in ED Medications  lidocaine (XYLOCAINE) 2 % jelly 1 application (1 application Urethral Given 09/23/21 2145)    ED Course/ Medical Decision Making/ A&P Clinical Course as of 09/23/21 2305  Thu Sep 23, 2021  2039 Spoke with Dr. Junious Silk of urology, he recommended a 2 way Foley catheter be placed with hand irrigation.  If there is any difficulty placing the catheter or if the bleeding does not clear up give him a call back. [EH]  2235 Patient re-evaluated, has about 75-144m of blood in catheter bag over half an hour.  [EH]  2246 Spoke with Dr. EJunious Silk urology, he will come see the patient and evaluate them. [EH]    Clinical Course User Index [EH] HLorin Glass PA-C   Medical Decision Making Amount and/or Complexity of Data Reviewed Labs: ordered.    Details: CBC does show mild leukocytosis.  No significant anemia.  BMP without evidence of AKI, creatinine 0.52.  UA is ordered, however not obtained due to frank blood. Radiology:     Details: CT renal study shows a urethrocele/urethral diverticulum. I did speak wit with Dr. BOwens Sharkto confirm these findings that have previously been noted as a vaginal cyst.  CT also shows layering in the bladder and in the diverticulum consistent with reported clots. Discussion of management or test interpretation with external provider(s): Urology   Patient is a 51year old  woman who presents today for evaluation of about 24 hours of gross hematuria. She had a CT scan obtained without stone, showing concern for diverticulum and bleeding in the bladder.  I spoke with urology who recommended placement of a Foley catheter with irrigation. This was ordered. After irrigation patient had passage of about 75 to 100 mL of what appeared to be mostly blood with clots through the catheter and about half an hour. I spoke with Dr. EJunious Silkfrom urology again who will come see the patient for evaluation.  At shift change care was transferred to Dr. Roslynn Amble who will follow pending studies, re-evaulate and determine disposition.     Note: Portions of this report may have been transcribed using voice recognition software. Every effort was made to ensure accuracy; however, inadvertent computerized transcription errors may be present                           Final Clinical Impression(s) / ED Diagnoses Final diagnoses:  Gross hematuria  Ureteral diverticulum    Rx / DC Orders ED Discharge Orders     None         Lorin Glass, PA-C 09/23/21 2305    Lucrezia Starch, MD 09/24/21 1536

## 2021-09-23 NOTE — ED Triage Notes (Signed)
Pt BIB EMS from home. Pt c/o blood in her urine starting yesterday. Pt has difficulty urinating, starting yesterday. Pt c/o vaginal discomfort. Pt went to UC yesterday and was prescribed antibiotics, pt states she has started the antibiotic regimen.  142/88 100% RA

## 2021-09-24 ENCOUNTER — Observation Stay (HOSPITAL_COMMUNITY): Payer: Medicaid Other

## 2021-09-24 ENCOUNTER — Encounter (HOSPITAL_COMMUNITY): Payer: Self-pay | Admitting: Urology

## 2021-09-24 ENCOUNTER — Observation Stay (HOSPITAL_COMMUNITY): Payer: Medicaid Other | Admitting: Anesthesiology

## 2021-09-24 ENCOUNTER — Encounter (HOSPITAL_COMMUNITY)
Admission: EM | Disposition: A | Discharge status: Home / Self Care | Payer: Self-pay | Source: Home / Self Care | Attending: Emergency Medicine

## 2021-09-24 DIAGNOSIS — I10 Essential (primary) hypertension: Secondary | ICD-10-CM

## 2021-09-24 DIAGNOSIS — R310 Gross hematuria: Secondary | ICD-10-CM | POA: Diagnosis present

## 2021-09-24 DIAGNOSIS — Z720 Tobacco use: Secondary | ICD-10-CM

## 2021-09-24 DIAGNOSIS — Z20822 Contact with and (suspected) exposure to covid-19: Secondary | ICD-10-CM | POA: Diagnosis not present

## 2021-09-24 DIAGNOSIS — J4520 Mild intermittent asthma, uncomplicated: Secondary | ICD-10-CM | POA: Diagnosis not present

## 2021-09-24 DIAGNOSIS — N361 Urethral diverticulum: Secondary | ICD-10-CM | POA: Diagnosis not present

## 2021-09-24 HISTORY — PX: TRANSURETHRAL RESECTION OF BLADDER TUMOR WITH MITOMYCIN-C: SHX6459

## 2021-09-24 LAB — URINALYSIS, ROUTINE W REFLEX MICROSCOPIC
Bilirubin Urine: NEGATIVE
Glucose, UA: NEGATIVE mg/dL
Ketones, ur: NEGATIVE mg/dL
Leukocytes,Ua: NEGATIVE
Nitrite: POSITIVE — AB
Protein, ur: 100 mg/dL — AB
RBC / HPF: >50 RBC/hpf — ABNORMAL HIGH (ref 0–5)
Specific Gravity, Urine: 1.003 — ABNORMAL LOW (ref 1.005–1.030)
pH: 6 (ref 5.0–8.0)

## 2021-09-24 LAB — CBC WITH DIFFERENTIAL/PLATELET
Abs Immature Granulocytes: 0.04 10*3/uL (ref 0.00–0.07)
Basophils Absolute: 0.1 10*3/uL (ref 0.0–0.1)
Basophils Relative: 1 %
Eosinophils Absolute: 0.2 10*3/uL (ref 0.0–0.5)
Eosinophils Relative: 1 %
HCT: 38.6 % (ref 36.0–46.0)
Hemoglobin: 14 g/dL (ref 12.0–15.0)
Immature Granulocytes: 0 %
Lymphocytes Relative: 37 %
Lymphs Abs: 4.7 10*3/uL — ABNORMAL HIGH (ref 0.7–4.0)
MCH: 30.8 pg (ref 26.0–34.0)
MCHC: 36.3 g/dL — ABNORMAL HIGH (ref 30.0–36.0)
MCV: 84.8 fL (ref 80.0–100.0)
Monocytes Absolute: 0.6 10*3/uL (ref 0.1–1.0)
Monocytes Relative: 5 %
Neutro Abs: 7.1 10*3/uL (ref 1.7–7.7)
Neutrophils Relative %: 56 %
Platelets: 256 10*3/uL (ref 150–400)
RBC: 4.55 MIL/uL (ref 3.87–5.11)
RDW: 14.2 % (ref 11.5–15.5)
WBC: 12.7 10*3/uL — ABNORMAL HIGH (ref 4.0–10.5)
nRBC: 0 % (ref 0.0–0.2)

## 2021-09-24 LAB — RESP PANEL BY RT-PCR (FLU A&B, COVID) ARPGX2
Influenza A by PCR: NEGATIVE
Influenza B by PCR: NEGATIVE
SARS Coronavirus 2 by RT PCR: NEGATIVE

## 2021-09-24 LAB — BASIC METABOLIC PANEL
Anion gap: 11 (ref 5–15)
BUN: 8 mg/dL (ref 6–20)
CO2: 28 mmol/L (ref 22–32)
Calcium: 8.9 mg/dL (ref 8.9–10.3)
Chloride: 98 mmol/L (ref 98–111)
Creatinine, Ser: 0.54 mg/dL (ref 0.44–1.00)
GFR, Estimated: >60 mL/min (ref >60)
Glucose, Bld: 100 mg/dL — ABNORMAL HIGH (ref 70–99)
Potassium: 3.2 mmol/L — ABNORMAL LOW (ref 3.5–5.1)
Sodium: 137 mmol/L (ref 135–145)

## 2021-09-24 SURGERY — TRANSURETHRAL RESECTION OF BLADDER TUMOR WITH MITOMYCIN-C
Anesthesia: General | Site: Urethra

## 2021-09-24 MED ORDER — CEFAZOLIN SODIUM-DEXTROSE 2-4 GM/100ML-% IV SOLN
2.0000 g | INTRAVENOUS | Status: AC
  Administered 2021-09-24: 2 g via INTRAVENOUS
  Filled 2021-09-24: qty 100

## 2021-09-24 MED ORDER — FENTANYL CITRATE PF 50 MCG/ML IJ SOSY
25.0000 ug | PREFILLED_SYRINGE | INTRAMUSCULAR | Status: DC | PRN
  Administered 2021-09-24: 50 ug via INTRAVENOUS

## 2021-09-24 MED ORDER — IPRATROPIUM-ALBUTEROL 0.5-2.5 (3) MG/3ML IN SOLN
3.0000 mL | Freq: Four times a day (QID) | RESPIRATORY_TRACT | Status: DC
  Administered 2021-09-24: 3 mL via RESPIRATORY_TRACT
  Filled 2021-09-24: qty 3

## 2021-09-24 MED ORDER — FAMOTIDINE 20 MG PO TABS
20.0000 mg | ORAL_TABLET | Freq: Every day | ORAL | Status: DC
  Administered 2021-09-24 – 2021-09-25 (×2): 20 mg via ORAL
  Filled 2021-09-24 (×2): qty 1

## 2021-09-24 MED ORDER — DEXAMETHASONE SODIUM PHOSPHATE 10 MG/ML IJ SOLN
INTRAMUSCULAR | Status: AC
  Filled 2021-09-24: qty 1

## 2021-09-24 MED ORDER — ACETAMINOPHEN 325 MG PO TABS: 650.0000 mg | ORAL_TABLET | ORAL | Status: DC | PRN

## 2021-09-24 MED ORDER — FENTANYL CITRATE (PF) 250 MCG/5ML IJ SOLN
INTRAMUSCULAR | Status: AC
  Filled 2021-09-24: qty 5

## 2021-09-24 MED ORDER — PROMETHAZINE HCL 25 MG/ML IJ SOLN: 6.2500 mg | INTRAMUSCULAR | Status: DC | PRN

## 2021-09-24 MED ORDER — POTASSIUM CHLORIDE CRYS ER 20 MEQ PO TBCR
40.0000 meq | EXTENDED_RELEASE_TABLET | ORAL | Status: AC
  Administered 2021-09-24 (×2): 40 meq via ORAL
  Filled 2021-09-24 (×2): qty 2

## 2021-09-24 MED ORDER — FENTANYL CITRATE (PF) 100 MCG/2ML IJ SOLN
INTRAMUSCULAR | Status: DC | PRN
  Administered 2021-09-24: 50 ug via INTRAVENOUS
  Administered 2021-09-24: 100 ug via INTRAVENOUS
  Administered 2021-09-24: 50 ug via INTRAVENOUS

## 2021-09-24 MED ORDER — OXYCODONE HCL 5 MG PO TABS: 5.0000 mg | ORAL_TABLET | ORAL | Status: DC | PRN

## 2021-09-24 MED ORDER — MIDAZOLAM HCL 2 MG/2ML IJ SOLN
INTRAMUSCULAR | Status: AC
  Filled 2021-09-24: qty 2

## 2021-09-24 MED ORDER — CHLORHEXIDINE GLUCONATE 0.12 % MT SOLN
15.0000 mL | OROMUCOSAL | Status: AC
  Administered 2021-09-24: 15 mL via OROMUCOSAL

## 2021-09-24 MED ORDER — MORPHINE SULFATE (PF) 2 MG/ML IV SOLN
2.0000 mg | INTRAVENOUS | Status: DC | PRN
  Administered 2021-09-25: 2 mg via INTRAVENOUS
  Filled 2021-09-24: qty 1

## 2021-09-24 MED ORDER — PROPOFOL 10 MG/ML IV BOLUS
INTRAVENOUS | Status: DC | PRN
  Administered 2021-09-24: 150 mg via INTRAVENOUS
  Administered 2021-09-24: 50 mg via INTRAVENOUS

## 2021-09-24 MED ORDER — SODIUM CHLORIDE 0.9 % IR SOLN
Status: DC | PRN
  Administered 2021-09-24: 3000 mL via INTRAVESICAL
  Administered 2021-09-24: 6000 mL via INTRAVESICAL

## 2021-09-24 MED ORDER — ONDANSETRON HCL 4 MG/2ML IJ SOLN: 4.0000 mg | INTRAMUSCULAR | Status: DC | PRN

## 2021-09-24 MED ORDER — CHLORHEXIDINE GLUCONATE CLOTH 2 % EX PADS
6.0000 | MEDICATED_PAD | Freq: Every day | CUTANEOUS | Status: DC
Start: 2021-09-24 — End: 2021-09-25
  Administered 2021-09-24 – 2021-09-25 (×2): 6 via TOPICAL

## 2021-09-24 MED ORDER — PNEUMOCOCCAL VAC POLYVALENT 25 MCG/0.5ML IJ INJ
0.5000 mL | INJECTION | INTRAMUSCULAR | Status: DC
  Filled 2021-09-24: qty 0.5

## 2021-09-24 MED ORDER — ALBUTEROL SULFATE (2.5 MG/3ML) 0.083% IN NEBU
INHALATION_SOLUTION | RESPIRATORY_TRACT | Status: AC
  Filled 2021-09-24: qty 3

## 2021-09-24 MED ORDER — SODIUM CHLORIDE 0.9 % IV SOLN: INTRAVENOUS | Status: DC

## 2021-09-24 MED ORDER — FENTANYL CITRATE PF 50 MCG/ML IJ SOSY
PREFILLED_SYRINGE | INTRAMUSCULAR | Status: AC
  Administered 2021-09-24: 50 ug via INTRAVENOUS
  Filled 2021-09-24: qty 2

## 2021-09-24 MED ORDER — CEPHALEXIN 500 MG PO CAPS: 500.0000 mg | ORAL_CAPSULE | Freq: Every day | ORAL | 0 refills | Status: DC

## 2021-09-24 MED ORDER — LIDOCAINE 2% (20 MG/ML) 5 ML SYRINGE
INTRAMUSCULAR | Status: DC | PRN
  Administered 2021-09-24: 100 mg via INTRAVENOUS

## 2021-09-24 MED ORDER — LACTATED RINGERS IV SOLN: INTRAVENOUS | Status: DC

## 2021-09-24 MED ORDER — NICOTINE POLACRILEX 2 MG MT GUM
2.0000 mg | CHEWING_GUM | OROMUCOSAL | Status: DC | PRN
  Administered 2021-09-24: 2 mg via ORAL
  Filled 2021-09-24 (×2): qty 1

## 2021-09-24 MED ORDER — NICOTINE 14 MG/24HR TD PT24
14.0000 mg | MEDICATED_PATCH | Freq: Every day | TRANSDERMAL | Status: DC
  Administered 2021-09-24 – 2021-09-25 (×2): 14 mg via TRANSDERMAL
  Filled 2021-09-24 (×2): qty 1

## 2021-09-24 MED ORDER — DEXAMETHASONE SODIUM PHOSPHATE 10 MG/ML IJ SOLN
INTRAMUSCULAR | Status: DC | PRN
  Administered 2021-09-24: 5 mg via INTRAVENOUS

## 2021-09-24 MED ORDER — AMISULPRIDE (ANTIEMETIC) 5 MG/2ML IV SOLN: 10.0000 mg | Freq: Once | INTRAVENOUS | Status: DC | PRN

## 2021-09-24 MED ORDER — ATORVASTATIN CALCIUM 40 MG PO TABS: 40.0000 mg | ORAL_TABLET | Freq: Every day | ORAL | Status: DC

## 2021-09-24 MED ORDER — AMLODIPINE BESYLATE 10 MG PO TABS
10.0000 mg | ORAL_TABLET | Freq: Every day | ORAL | Status: DC
  Administered 2021-09-24 – 2021-09-25 (×2): 10 mg via ORAL
  Filled 2021-09-24: qty 1
  Filled 2021-09-24: qty 2

## 2021-09-24 MED ORDER — ALBUTEROL SULFATE (2.5 MG/3ML) 0.083% IN NEBU: 3.0000 mL | INHALATION_SOLUTION | Freq: Four times a day (QID) | RESPIRATORY_TRACT | Status: DC | PRN

## 2021-09-24 MED ORDER — ONDANSETRON HCL 4 MG/2ML IJ SOLN
INTRAMUSCULAR | Status: AC
  Filled 2021-09-24: qty 2

## 2021-09-24 MED ORDER — AMISULPRIDE (ANTIEMETIC) 5 MG/2ML IV SOLN
INTRAVENOUS | Status: AC
  Administered 2021-09-24: 10 mg via INTRAVENOUS
  Filled 2021-09-24: qty 4

## 2021-09-24 MED ORDER — HYOSCYAMINE SULFATE 0.125 MG SL SUBL
0.1250 mg | SUBLINGUAL_TABLET | SUBLINGUAL | Status: DC | PRN
  Filled 2021-09-24: qty 1

## 2021-09-24 MED ORDER — MIDAZOLAM HCL 5 MG/5ML IJ SOLN
INTRAMUSCULAR | Status: DC | PRN
  Administered 2021-09-24: 2 mg via INTRAVENOUS

## 2021-09-24 MED ORDER — IPRATROPIUM-ALBUTEROL 0.5-2.5 (3) MG/3ML IN SOLN
3.0000 mL | Freq: Two times a day (BID) | RESPIRATORY_TRACT | Status: DC
  Administered 2021-09-25: 3 mL via RESPIRATORY_TRACT
  Filled 2021-09-24: qty 3

## 2021-09-24 MED ORDER — IPRATROPIUM-ALBUTEROL 0.5-2.5 (3) MG/3ML IN SOLN: 3.0000 mL | RESPIRATORY_TRACT | Status: DC | PRN

## 2021-09-24 MED ORDER — ALBUTEROL SULFATE (2.5 MG/3ML) 0.083% IN NEBU
2.5000 mg | INHALATION_SOLUTION | Freq: Once | RESPIRATORY_TRACT | Status: AC
  Administered 2021-09-24: 2.5 mg via RESPIRATORY_TRACT

## 2021-09-24 SURGICAL SUPPLY — 12 items
BAG URINE DRAIN 2000ML AR STRL (UROLOGICAL SUPPLIES) IMPLANT
BAG URO CATCHER STRL LF (MISCELLANEOUS) ×2 IMPLANT
CATH TIEMANN FOLEY 18FR 5CC (CATHETERS) ×1 IMPLANT
DRAPE FOOT SWITCH (DRAPES) ×2 IMPLANT
GLOVE SURG ENC TEXT LTX SZ7.5 (GLOVE) ×2 IMPLANT
GOWN STRL REUS W/TWL XL LVL3 (GOWN DISPOSABLE) ×2 IMPLANT
KIT TURNOVER KIT A (KITS) IMPLANT
LOOP CUT BIPOLAR 24F LRG (ELECTROSURGICAL) ×1 IMPLANT
MANIFOLD NEPTUNE II (INSTRUMENTS) ×2 IMPLANT
PACK CYSTO (CUSTOM PROCEDURE TRAY) ×2 IMPLANT
TUBING CONNECTING 10 (TUBING) ×2 IMPLANT
TUBING UROLOGY SET (TUBING) ×2 IMPLANT

## 2021-09-24 NOTE — Consult Note (Addendum)
Consultation    Kirston Luty VQQ:[id number] DOB: 11-16-1970 DOA: 09/23/2021  PCP: [name], NP    Reason for consultation : Post operative hypoxemic respiratory failure   HPI: Robin Fischer is a 51 y.o. female with medical history significant of  asthma, bronchitis, GERD, HTN and tobacco abuse who was admitted to the urology service due to gross hematuria.  Dr Junious Silk performed cystoscopy and patient was diagnosed with urethral diverticulum, then she underwent clot evacuation, fulguration urethra and cathter placement.  She tolerated procedure well and was transferred to PACU. While in PACU patient was not able to wean off supplemental 02 due to persistent hypoxemia.   Patient required general anesthesia during procedure, she was ventilated with LMA with no complications.  Patient reports feeling tight when she woke up in PACU, but not wheezing, or cough. Tight sensation was moderate in intensity, improved with supplemental 02, no worsening factors, and it was associated with hypoxemia.   Patient reports having asthma and chronic bronchitis that has been well controlled with bronchodilator therapy at home with no recent exacerbation.   Review of Systems:  1. General: No fevers, no chills, no weight gain or weight loss 2. ENT: No runny nose or sore throat, no hearing disturbances 3. Pulmonary: No dyspnea, cough, wheezing, or hemoptysis. Positive  tight chest sensation as mentioned in HPI 4. Cardiovascular: No angina, claudication, lower extremity edema, pnd or orthopnea 5. Gastrointestinal: No nausea or vomiting, no diarrhea or constipation 6. Hematology: No easy bruisability or frequent infections 7. Urology: No dysuria, positive hematuria  8. Dermatology: No rashes. 9. Neurology: No seizures or paresthesias 10. Musculoskeletal: No joint pain or deformities  Past Medical History:  Diagnosis Date   Asthma    Bronchitis    GERD (gastroesophageal reflux disease)     High cholesterol    Hypertension    Pancreatitis    Tobacco abuse     History reviewed. No pertinent surgical history.   reports that she has been smoking. She has never used smokeless tobacco. She reports current alcohol use. She reports that she does not use drugs.  No Known Allergies  Family History  Problem Relation Age of Onset   Hypertension Mother    Hyperlipidemia Mother    Hypertension Father    Hyperlipidemia Father      Prior to Admission medications   Medication Sig Start Date End Date Taking? Authorizing Provider  acetaminophen (TYLENOL) 325 MG tablet Take 2 tablets (650 mg total) by mouth every 6 (six) hours as needed. Patient taking differently: Take 650 mg by mouth every 6 (six) hours as needed for mild pain. 05/07/20  Yes [name], [name], PA-C  amLODipine (NORVASC) 10 MG tablet TAKE 1 (ONE) TABLET (10 MG TOTAL) BY MOUTH DAILY. Patient taking differently: Take 10 mg by mouth daily. 07/14/21  Yes [name], NP  budesonide (PULMICORT) 180 MCG/ACT inhaler Inhale 2 puffs into the lungs 2 (two) times daily. Patient taking differently: Inhale 2 puffs into the lungs 2 (two) times daily as needed (shortness of breath). 09/26/19  Yes [name], NP  cephALEXin (KEFLEX) 500 MG capsule Take 1 capsule (500 mg total) by mouth at bedtime. Patient taking differently: Take 500 mg by mouth at bedtime. For 5 days 09/24/21  Yes [name], MD  cetirizine (ZYRTEC) 10 MG tablet TAKE 1 TABLET (10 MG TOTAL) BY MOUTH DAILY. Patient taking differently: Take 10 mg by mouth daily as needed for allergies or rhinitis. 09/24/20  Yes [name],  NP  famotidine (PEPCID) 20 MG tablet TAKE 1 TABLET (20 MG TOTAL) BY MOUTH DAILY. 07/14/21  Yes [name], NP  fluticasone (FLONASE) 50 MCG/ACT nasal spray Place 1 spray into both nostrils daily. Patient taking differently: Place 1 spray into both nostrils daily as needed for allergies or rhinitis. 05/07/20  Yes [name], [name],  PA-C  Olopatadine HCl 0.2 % SOLN Place 1 drop into both eyes every morning.  04/03/20  Yes [provider]  phenazopyridine (PYRIDIUM) 200 MG tablet Take 1 tablet (200 mg total) by mouth 3 (three) times daily. 09/22/21  Yes [name], [name], MD  PROAIR HFA 108 [phone] Base) MCG/ACT inhaler INHALE 2 PUFFS INTO THE LUNGS EVERY 6 (SIX) HOURS AS NEEDED FOR WHEEZING OR SHORTNESS OF BREATH. Patient taking differently: Inhale 2 puffs into the lungs every 4 (four) hours as needed for wheezing or shortness of breath. 07/02/20  Yes [name], [name], MD  RESTASIS 0.05 % ophthalmic emulsion Place 1 drop into both eyes 2 (two) times daily. 02/28/20  Yes [provider]  traZODone (DESYREL) 100 MG tablet TAKE ONE TABLET BY MOUTH DAILY AT BEDTIME. Patient taking differently: Take 100 mg by mouth at bedtime as needed for sleep. 07/14/21  Yes [name], NP  ALPRAZolam [name]) 0.25 MG tablet Take 1 tablet (0.25 mg total) by mouth at bedtime as needed for anxiety or sleep. Patient not taking: Reported on 09/24/2021 03/09/21   [name], NP  atorvastatin (LIPITOR) 40 MG tablet Take 1 tablet (40 mg total) by mouth daily. Patient not taking: Reported on 09/24/2021 01/01/20   [name], NP  Blood Pressure Monitor KIT 1 Bag by Does not apply route 3 (three) times daily as needed. 09/26/19   [name], NP    Physical Exam: Vitals:   09/24/21 1608 09/24/21 1630 09/24/21 1645 09/24/21 1700  BP: 139/82 (!) 142/83 (!) 159/79 (!) 146/77  Pulse: 78 79 72 71  Resp: _0 Temp: 98.1 F (36.7 C)     TempSrc:      SpO2: 100% 95% 93% 97%  Weight:      Height:        Vitals:   09/24/21 1608 09/24/21 1630 09/24/21 1645 09/24/21 1700  BP: 139/82 (!) 142/83 (!) 159/79 (!) 146/77  Pulse: 78 79 72 71  Resp: _1 Temp: 98.1 F (36.7 C)     TempSrc:      SpO2: 100% 95% 93% 97%  Weight:      Height:       General: Not in pain or dyspnea  Neurology: Awake and alert,  non focal Head and Neck. Head normocephalic. Neck supple with no adenopathy or thyromegaly.   E ENT: no pallor, no icterus, oral mucosa moist Cardiovascular: No JVD. S1-S2 present, rhythmic, no gallops, rubs, or murmurs. No lower extremity edema. Pulmonary: positive breath sounds bilaterally, adequate air movement, no wheezing, or rhonchi mild rales at bases bilaterally  Gastrointestinal. Abdomen soft and non tender Skin. No rashes Musculoskeletal: no joint deformities    Labs on Admission: I have personally reviewed following labs and imaging studies  CBC: Recent Labs  Lab 09/23/21 1507 09/24/21 0604  WBC 14.7* 12.7*  NEUTROABS  --  7.1  HGB 15.4* 14.0  HCT 42.4 38.6  MCV 85.0 84.8  PLT 293 867   Basic Metabolic Panel: Recent Labs  Lab 09/23/21 1507 09/24/21 0604  NA 137 137  K 3.5 3.2*  CL  100 98  CO2 25 28  GLUCOSE 108* 100*  BUN 7 8  CREATININE 0.52 0.54  CALCIUM 9.5 8.9   GFR: Estimated Creatinine Clearance: 92.5 mL/min (by C-G formula based on SCr of 0.54 mg/dL). Liver Function Tests: No results for input(s): AST, ALT, ALKPHOS, BILITOT, PROT, ALBUMIN in the last 168 hours. No results for input(s): LIPASE, AMYLASE in the last 168 hours. No results for input(s): AMMONIA in the last 168 hours. Coagulation Profile: No results for input(s): INR, PROTIME in the last 168 hours. Cardiac Enzymes: No results for input(s): CKTOTAL, CKMB, CKMBINDEX, TROPONINI in the last 168 hours. BNP (last 3 results) No results for input(s): PROBNP in the last 8760 hours. HbA1C: No results for input(s): HGBA1C in the last 72 hours. CBG: No results for input(s): GLUCAP in the last 168 hours. Lipid Profile: No results for input(s): CHOL, HDL, LDLCALC, TRIG, CHOLHDL, LDLDIRECT in the last 72 hours. Thyroid Function Tests: No results for input(s): TSH, T4TOTAL, FREET4, T3FREE, THYROIDAB in the last 72 hours. Anemia Panel: No results for input(s): VITAMINB12, FOLATE, FERRITIN, TIBC,  IRON, RETICCTPCT in the last 72 hours. Urine analysis:    Component Value Date/Time   COLORURINE AMBER (A) 09/24/2021 0013   APPEARANCEUR CLEAR 09/24/2021 0013   LABSPEC 1.003 (L) 09/24/2021 0013   PHURINE 6.0 09/24/2021 0013   GLUCOSEU NEGATIVE 09/24/2021 0013   HGBUR MODERATE (A) 09/24/2021 0013   BILIRUBINUR NEGATIVE 09/24/2021 0013   BILIRUBINUR negative 01/18/2017 1658   KETONESUR NEGATIVE 09/24/2021 0013   PROTEINUR 100 (A) 09/24/2021 0013   UROBILINOGEN >=8.0 09/22/2021 1445   NITRITE POSITIVE (A) 09/24/2021 0013   LEUKOCYTESUR NEGATIVE 09/24/2021 0013    Radiological Exams on Admission: CT Renal Stone Study  Result Date: 09/23/2021 CLINICAL DATA:  Hematuria for 2 days, urinary tract infection EXAM: CT ABDOMEN AND PELVIS WITHOUT CONTRAST TECHNIQUE: Multidetector CT imaging of the abdomen and pelvis was performed following the standard protocol without IV contrast. RADIATION DOSE REDUCTION: This exam was performed according to the departmental dose-optimization program which includes automated exposure control, adjustment of the mA and/or kV according to patient size and/or use of iterative reconstruction technique. COMPARISON:  05/04/2019 FINDINGS: Lower chest: No acute pleural or parenchymal lung disease. Hepatobiliary: Large geographic areas of decreased attenuation throughout the liver parenchyma are nonspecific on this unenhanced exam, but likely reflect hepatic steatosis. No intrahepatic duct dilation. The gallbladder is unremarkable. Pancreas: Unremarkable unenhanced appearance. Spleen: Unremarkable unenhanced appearance. Adrenals/Urinary Tract: No urinary tract calculi or obstructive uropathy within either kidney. The bladder is moderately distended. High attenuation material is seen posteriorly along the bladder wall, measuring up to 1 cm in thickness. Given clinical history of gross hematuria, this could reflect blood clot within the bladder lumen. Underlying mucosal lesion  cannot be excluded, and cystoscopy is recommended. 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 Again, while this likely reflects blood products, underlying mass cannot be excluded. The adrenals are unremarkable. Stomach/Bowel: No bowel obstruction or ileus. Normal appendix right lower quadrant. No bowel wall thickening or inflammatory change. Vascular/Lymphatic: Aortic atherosclerosis. No enlarged abdominal or pelvic lymph nodes. Reproductive: Uterus and bilateral adnexa are unremarkable. Other: No free fluid or free intraperitoneal gas. No abdominal wall hernia. Musculoskeletal: No acute or destructive bony lesions. Reconstructed images demonstrate no additional findings. IMPRESSION: 1. High attenuation material within the posterior aspect of the bladder, likely representing blood products layering dependently. Underlying mucosal lesion  cannot be excluded. Cystoscopy is recommended. 2. Large urethrocele again noted, now with high attenuation material similar to the bladder lumen compatible with blood products. Urologic consultation may be useful. 3. No urinary tract calculi or obstructive uropathy. 4. Geographic decreased attenuation of the liver parenchyma, nonspecific on this unenhanced exam but likely reflecting hepatic steatosis. 5.  Aortic Atherosclerosis (ICD10-I70.0). Electronically Signed   By: Randa Ngo M.D.   On: 09/23/2021 15:48      Assessment/Plan Principal Problem:   Gross hematuria Active Problems:   Asthma   Hypertension   Tobacco abuse  Mrs. Tolley has been consulted for acute hypoxic post operative respiratory failure.  Patient underwent general anesthesia, invasive mechanical ventilation through LMA, with no complications. Noted hypoxic respiratory failure in the recovery room, unable to wean off supplemental oxygen.  Oxygen saturation 65-79% on room air. On her physical  examination blood pressure 146/77, heart rate 71, respiratory rate 16, oxygen saturation 97% on 2 L per nasal cannula. Her lungs had no wheezing or rhonchi but bibasilar rales, heart S1-S2, present, rhythmic, soft abdomen, no lower extremity edema.  Sodium 137, potassium 3.2, chloride 98, bicarb 28, glucose 100, BUN 8, creatinine 0.54, white count 12.7, hemoglobin 14.0, hematocrit 38.6, platelets 256. SARS COVID-19 negative.  Urinalysis specific gravity 1.003, > 50 red cells.  Post operative acute hypoxemic respiratory failure, in the setting of chronic bronchitis and asthma.  Likely multifactorial respiratory failure in the setting of chronic gas exchange impairment due to asthma and chronic bronchitis, possible COPD.  Patient under sedation, possible affecting lung mechanics and oxygenation.  Plan to continue supplemental 02 per [place] to keep 02 saturation 92% or greater.  Check chest radiograph. Add bronchodilator therapy q6 and as needed. Incentive spirometer and out of bed to chair tid with meals  2. Hypokalemia. Add Kcl 80 meq in 2 divided dosed and follow up K in am, renal function stable with serum cr at 0,54.   3. Gross hematuria. Hgb has been stable, continue close follow up and post cystoscopy management per urology team.   4. HTN. Continue blood pressure control with amlodipine.   5. Reactive leukocytosis. Wbc is 12,7 from 14,7, no signs of systemic infection.  Patient had one dose of prophylactic antibiotic therapy.     Status is: Observation   DVT prophylaxis: Scd   Code Status:   full  Family Communication:   No family at the bedside     Malorie Bigford Gerome Apley MD [facility]   09/24/2021, 6:12 PM

## 2021-09-24 NOTE — ED Notes (Signed)
X 1 lt Tyrese Capriotti, x 1 lav sent to lab for hold.

## 2021-09-24 NOTE — Transfer of Care (Signed)
Immediate Anesthesia Transfer of Care Note  Patient: Robin Fischer  Procedure(s) Performed: CYSTOSCOPY; CLOT EVACUATION; FULGERATION OF URETHRA (Urethra)  Patient Location: PACU  Anesthesia Type:General  Level of Consciousness: awake, alert , oriented and patient cooperative  Airway & Oxygen Therapy: Patient Spontanous Breathing and Patient connected to face mask oxygen  Post-op Assessment: Report given to RN, Post -op Vital signs reviewed and stable and Patient moving all extremities X 4  Post vital signs: stable  Last Vitals:  Vitals Value Taken Time  BP 143/81 09/24/21 1716  Temp 36.7 C 09/24/21 1608  Pulse 61 09/24/21 1721  Resp 16 09/24/21 1721  SpO2 93 % 09/24/21 1721  Vitals shown include unvalidated device data.  Last Pain:  Vitals:   09/24/21 1625  TempSrc:   PainSc: 5          Complications: No notable events documented.

## 2021-09-24 NOTE — Op Note (Signed)
Preoperative diagnosis: Urethral diverticulum, gross hematuria Postoperative diagnosis: Same  Procedure: Cystoscopy with clot evacuation, fulguration of urethra, Foley catheter placement  Surgeon: Junious Silk  Anesthesia: General  Indication procedure: Robin Fischer is a 51 year old female who developed vaginal pressure and gross hematuria.  She had trouble voiding.  CT scan revealed clot and a urethral diverticulum.  Because the diverticulum was distended it was pushing up on the urethra and making Foley catheter placement difficult and causing the patient pain.  Findings: On exam under anesthesia the vulva appeared normal without lesion, the introitus appeared normal.  There was no prolapse.  The meatus appeared normal.  There was a large anterior vaginal wall cystic mass which was palpably normal without induration consistent with a benign urethral diverticulum.  On cystoscopy the urethra was unremarkable apart from a large ostomy on the patient's right inferior edge of the diverticulum at 7:00.  The floor of the urethra was intact and it does look like the ostia could be closed at the time of a diverticulectomy. Once inside the diverticulum it was full of clot.  The clot was evacuated with the cystoscope and the diverticulum and palpation from below.  The remainder of the clot was extracted with rigid biopsy forceps.  Once the clot was evacuated there was benign findings in the diverticulum.  The wall was smooth without mucosal lesion.  There was a bridge of tissue that look like it had distended and torn and that might of contributed to the bleeding.  This was fulgurated and no other obvious bleeding was noted.  The patient had taken Pyridium.  There was orange E flux bilaterally but otherwise clear.  Trigone and ureteral orifice ease appeared normal.  There were good bilateral jets.  Bladder appeared normal without mucosal lesion.  No stone or foreign body.  No bleeding.  Description of procedure:  After consent was obtained patient brought to the operating room.  After adequate anesthesia she is placed on lithotomy position and prepped draped in usual sterile fashion.  Timeout was performed to confirm the patient and procedure.  Exam under anesthesia performed.  Cystoscope was passed per urethra where the ostomy was noted.  The bladder was inspected.  There was a few small clots in the bladder.  I backed the scope back out into the urethra and into the ostia and down into the diverticulum.  The scope was disassembled and with palpation on the anterior vaginal wall I was able to express most of the clot out of the diverticulum.  The remainder of the clot was removed with rigid biopsy forceps.  Once all the clot was clear I then reinspected the diverticulum, the urethra and the bladder.  The bladder was inspected with a 30 and 70 degree lens.  Again no worrisome findings or mucosal lesions in the diverticulum or the bladder.  There was a web of tissue in the diverticulum of the look like it had torn on the edges bleeding.  I swapped out for the resectoscope and passed that with the visual obturator into the bladder.  The loop and the handle were passed.  I was able to get another good look at the bladder and urethra with the continuous-flow.  I have asked the resectoscope out and then into the diverticulum and fulgurated lightly inside the diverticulum.  There was no other areas that were bleeding.  The scope was backed out.  I then repassed the cystoscope again inspected the diverticulum and the bladder again with no bleeding.  The  scope was removed and an 72 [place] coud catheter advanced and left to gravity drainage without difficulty.  She was then awakened taken the cover room in stable condition.  Complications: None  Blood loss: Minimal  Specimens: None  Drains: 84 French coud catheter  Disposition: Patient stable to PACU - I spoke to Robin Fischer and discussed the procedure, post-op care and  follow-up.

## 2021-09-24 NOTE — Anesthesia Postprocedure Evaluation (Signed)
Anesthesia Post Note  Patient: Mrytle Bento  Procedure(s) Performed: CYSTOSCOPY; CLOT EVACUATION; FULGERATION OF URETHRA (Urethra)     Patient location during evaluation: PACU Anesthesia Type: General Level of consciousness: awake and alert Pain management: pain level controlled Vital Signs Assessment: post-procedure vital signs reviewed and stable Respiratory status: spontaneous breathing, nonlabored ventilation, respiratory function stable and patient connected to nasal cannula oxygen Cardiovascular status: blood pressure returned to baseline and stable Postop Assessment: no apparent nausea or vomiting Anesthetic complications: no   No notable events documented.  Last Vitals:  Vitals:   09/24/21 1711 09/24/21 1712  BP:    Pulse:    Resp:    Temp:    SpO2: (!) 65% (!) 75%    Last Pain:  Vitals:   09/24/21 1820  TempSrc:   PainSc: 0-No pain                 Quindon Denker,W. EDMOND

## 2021-09-24 NOTE — Consult Note (Signed)
Consultation: Gross hematuria, urethral diverticulum Requested by: Dr. Madalyn Rob  History of Present Illness: "Joseph Art" is a 51 year old female who developed gross hematuria with clots yesterday.  She has had trouble voiding.  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  The diverticulum measured 3.5 x 3.1 cm.  Because of her trouble voiding and clots, nurses placed a 14 French Foley catheter but her urine was not clearing and it did not seem to be draining or irrigating well.  Urology was consulted. Of note, the patient had a CT scan in 2019 when the diverticulum measured about 2 cm and in 2020 when the diverticulum measured about 2.8 cm.  Of note, she denies any prior GU history.  She is a smoker but denies any prior chemical, radiation or chemotherapy exposure.  I tried to irrigate the 14 French Foley catheter and it did not irrigate very well.  The urine was bloody with some small clots.  Also, there appeared to be too much catheter hanging out for proper seating of the balloon at the bladder neck.  The nurse did say she met some resistance placing the catheter.  With the nurse Natosha as chaperone, I deflated the Foley balloon and removed that catheter.  I discussed placing a new catheter with patient and she elected to proceed.   Past Medical History:  Diagnosis Date   Asthma    Bronchitis    GERD (gastroesophageal reflux disease)    High cholesterol    Hypertension    Pancreatitis    Tobacco abuse    History reviewed. No pertinent surgical history.  Home Medications:  (Not in a hospital admission)  Allergies: No Known Allergies  Family History  Problem Relation Age of Onset   Hypertension Mother    Hyperlipidemia Mother    Hypertension Father    Hyperlipidemia Father    Social History:  reports that she has been  smoking. She has never used smokeless tobacco. She reports current alcohol use. She reports that she does not use drugs.  ROS: A complete review of systems was performed.  All systems are negative except for pertinent findings as noted. Review of Systems  All other systems reviewed and are negative.   Physical Exam:  Vital signs in last 24 hours: Temp:  [98.1 F (36.7 C)-98.2 F (36.8 C)] 98.1 F (36.7 C) (01/19 2359) Pulse Rate:  [78-81] 80 (01/19 2359) Resp:  [16-20] 20 (01/19 2359) BP: (118-137)/(52-100) 130/52 (01/19 2359) SpO2:  [97 %-100 %] 100 % (01/19 2359) Nurse Natosha-chaperone General:  Alert and oriented, No acute distress HEENT: Normocephalic, atraumatic Cardiovascular: Regular rate and rhythm Lungs: Regular rate and effort Abdomen: Soft, nontender, nondistended, no abdominal masses Back: No CVA tenderness Extremities: No edema Neurologic: Grossly intact GU: The vulva appeared normal without lesion, the introitus appeared normal without lesion.  The meatus was slightly edematous but appeared normal.  On vaginal exam a large diverticulum was noted on the anterior vaginal wall.  Procedure: The meatus was prepped in the past and 40 French Foley catheter but it kept turning and going into the diverticulum.  At 1 point we thought we were in the bladder and started to inflate the balloon but the patient had pain, and I could feel the balloon and the catheter in the diverticulum.  We deflated the balloon and remove this  catheter.  I then injected some lidocaine jelly and a 20 [place] coud catheter went in without difficulty.  The bladder was irrigated and the urine was clear.  Laboratory Data:  Results for orders placed or performed during the hospital encounter of 09/23/21 (from the past 24 hour(s))  Basic metabolic panel     Status: Abnormal   Collection Time: 09/23/21  3:07 PM  Result Value Ref Range   Sodium 137 135 - 145 mmol/L   Potassium 3.5 3.5 - 5.1 mmol/L    Chloride 100 98 - 111 mmol/L   CO2 25 22 - 32 mmol/L   Glucose, Bld 108 (H) 70 - 99 mg/dL   BUN 7 6 - 20 mg/dL   Creatinine, Ser 0.52 0.44 - 1.00 mg/dL   Calcium 9.5 8.9 - 10.3 mg/dL   GFR, Estimated >60 >60 mL/min   Anion gap 12 5 - 15  CBC     Status: Abnormal   Collection Time: 09/23/21  3:07 PM  Result Value Ref Range   WBC 14.7 (H) 4.0 - 10.5 K/uL   RBC 4.99 3.87 - 5.11 MIL/uL   Hemoglobin 15.4 (H) 12.0 - 15.0 g/dL   HCT 42.4 36.0 - 46.0 %   MCV 85.0 80.0 - 100.0 fL   MCH 30.9 26.0 - 34.0 pg   MCHC 36.3 (H) 30.0 - 36.0 g/dL   RDW 14.4 11.5 - 15.5 %   Platelets 293 150 - 400 K/uL   nRBC 0.1 0.0 - 0.2 %   No results found for this or any previous visit (from the past 240 hour(s)). Creatinine: Recent Labs    09/23/21 1507  CREATININE 0.52  CT scan images times 11/22/2020, 2020, 2019-reviewed  Impression/Assessment/plan:  Large urethral diverticulum -this was likely bleeding and preventing her from voiding.  Foley catheter in place and urine from bladder is clear.  We will leave the Foley catheter.  She needs cystoscopy and given how she tolerated things tonight, I think it is going to be better to do a good exam under anesthesia, cystoscopy, urethral biopsy, possible TURBT in the OR.  I will arrange this for next Friday.  I will also order an MRI of the pelvis.  [name] 09/24/2021

## 2021-09-24 NOTE — H&P (Signed)
Patient has continued to require hand irrigation and some bleeding around catheter. Will admit for continued prn irrigation and add on for cystoscopy, possible biopsy or resection of urethra or bladder.

## 2021-09-24 NOTE — Discharge Instructions (Addendum)
Foley catheter needs to be removed next week.  We can teach you how to remove it at home or call [facility] for appointment, [phone].

## 2021-09-24 NOTE — Interval H&P Note (Signed)
History and Physical Interval Note:  09/24/2021 2:44 PM  Robin Fischer  has presented today for surgery, with the diagnosis of Gross hematuria, uretheral diverticulum.  The various methods of treatment have been discussed with the patient and family. After consideration of risks, benefits and other options for treatment, the patient has consented to cystoscopy with possible biopsy or resection of urethra or bladder as a surgical intervention.  The patient's history has been reviewed, patient examined, no change in status, stable for surgery.  She continues to have gross hematuria and vaginal pain and pressure. I discussed with the patient the nature, potential benefits, risks and alternatives to the above procedure including side effects of the proposed treatment, the likelihood of the patient achieving the goals of the procedure, and any potential problems that might occur during the procedure or recuperation. We discussed risk of continued bleeding, fistula among others. We discussed benign and malignant nature of gross hematuria. All questions answered. Patient elects to proceed. I have reviewed the patient's chart and labs.      [name]

## 2021-09-24 NOTE — ED Notes (Signed)
Call placed to [facility] [phone] answering service for call back to [facility] MD

## 2021-09-24 NOTE — Anesthesia Procedure Notes (Addendum)
Procedure Name: LMA Insertion Date/Time: 09/24/2021 3:04 PM Performed by: Lissa Morales, CRNA Pre-anesthesia Checklist: Patient identified, Emergency Drugs available, Suction available and Patient being monitored Patient Re-evaluated:Patient Re-evaluated prior to induction Oxygen Delivery Method: Circle system utilized Preoxygenation: Pre-oxygenation with 100% oxygen Induction Type: IV induction Ventilation: Mask ventilation without difficulty LMA: LMA inserted LMA Size: 4.0 Tube type: Oral Number of attempts: 1 Placement Confirmation: positive ETCO2 Tube secured with: Tape Dental Injury: Teeth and Oropharynx as per pre-operative assessment  Comments: Supreme LMA not seating well changed to  regular LMA and seating well

## 2021-09-24 NOTE — Anesthesia Preprocedure Evaluation (Signed)
Anesthesia Evaluation  Patient identified by MRN, date of birth, ID band Patient awake    Reviewed: Allergy & Precautions, NPO status , Patient's Chart, lab work & pertinent test results  Airway Mallampati: II  TM Distance: >3 FB Neck ROM: Full    Dental no notable dental hx.    Pulmonary asthma , Current Smoker,    Pulmonary exam normal breath sounds clear to auscultation       Cardiovascular hypertension, negative cardio ROS Normal cardiovascular exam Rhythm:Regular Rate:Normal     Neuro/Psych negative neurological ROS  negative psych ROS   GI/Hepatic negative GI ROS, Neg liver ROS, GERD  ,  Endo/Other  negative endocrine ROS  Renal/GU negative Renal ROS  negative genitourinary   Musculoskeletal negative musculoskeletal ROS (+)   Abdominal   Peds negative pediatric ROS (+)  Hematology negative hematology ROS (+)   Anesthesia Other Findings   Reproductive/Obstetrics negative OB ROS                            Anesthesia Physical Anesthesia Plan  ASA: 3  Anesthesia Plan: General   Post-op Pain Management:    Induction: Intravenous  PONV Risk Score and Plan: 2 and Treatment may vary due to age or medical condition, Ondansetron and Dexamethasone  Airway Management Planned: LMA and Oral ETT  Additional Equipment:   Intra-op Plan:   Post-operative Plan: Extubation in OR  Informed Consent: I have reviewed the patients History and Physical, chart, labs and discussed the procedure including the risks, benefits and alternatives for the proposed anesthesia with the patient or authorized representative who has indicated his/her understanding and acceptance.     Dental advisory given  Plan Discussed with: CRNA, Anesthesiologist and Surgeon  Anesthesia Plan Comments:         Anesthesia Quick Evaluation

## 2021-09-24 NOTE — ED Notes (Signed)
2nd call placed to [facility]rology

## 2021-09-24 NOTE — Progress Notes (Signed)
Pt d/c'd from PACU. Patient cannot maintain sats off 02. Will need to admit and consult hospitalist.

## 2021-09-25 ENCOUNTER — Encounter (HOSPITAL_COMMUNITY): Payer: Self-pay | Admitting: Urology

## 2021-09-25 LAB — BASIC METABOLIC PANEL
Anion gap: 11 (ref 5–15)
BUN: 8 mg/dL (ref 6–20)
CO2: 27 mmol/L (ref 22–32)
Calcium: 9.2 mg/dL (ref 8.9–10.3)
Chloride: 101 mmol/L (ref 98–111)
Creatinine, Ser: 0.51 mg/dL (ref 0.44–1.00)
GFR, Estimated: >60 mL/min (ref >60)
Glucose, Bld: 139 mg/dL — ABNORMAL HIGH (ref 70–99)
Potassium: 4 mmol/L (ref 3.5–5.1)
Sodium: 139 mmol/L (ref 135–145)

## 2021-09-25 LAB — CBC
HCT: 37.5 % (ref 36.0–46.0)
Hemoglobin: 13.3 g/dL (ref 12.0–15.0)
MCH: 30.8 pg (ref 26.0–34.0)
MCHC: 35.5 g/dL (ref 30.0–36.0)
MCV: 86.8 fL (ref 80.0–100.0)
Platelets: 262 10*3/uL (ref 150–400)
RBC: 4.32 MIL/uL (ref 3.87–5.11)
RDW: 14.1 % (ref 11.5–15.5)
WBC: 13.2 10*3/uL — ABNORMAL HIGH (ref 4.0–10.5)
nRBC: 0 % (ref 0.0–0.2)

## 2021-09-25 LAB — HIV ANTIBODY (ROUTINE TESTING W REFLEX): HIV Screen 4th Generation wRfx: NONREACTIVE

## 2021-09-25 LAB — MAGNESIUM: Magnesium: 1.9 mg/dL (ref 1.7–2.4)

## 2021-09-25 MED ORDER — ORAL CARE MOUTH RINSE
15.0000 mL | Freq: Two times a day (BID) | OROMUCOSAL | Status: DC
  Administered 2021-09-25: 15 mL via OROMUCOSAL

## 2021-09-25 NOTE — Plan of Care (Signed)
  Problem: Education: Goal: Knowledge of General Education information will improve Description: Including pain rating scale, medication(s)/side effects and non-pharmacologic comfort measures Outcome: Adequate for Discharge   Problem: Health Behavior/Discharge Planning: Goal: Ability to manage health-related needs will improve Outcome: Adequate for Discharge   Problem: Clinical Measurements: Goal: Ability to maintain clinical measurements within normal limits will improve Outcome: Adequate for Discharge Goal: Will remain free from infection Outcome: Adequate for Discharge Goal: Diagnostic test results will improve Outcome: Adequate for Discharge Goal: Respiratory complications will improve Outcome: Adequate for Discharge Goal: Cardiovascular complication will be avoided Outcome: Adequate for Discharge   Problem: Activity: Goal: Risk for activity intolerance will decrease Outcome: Adequate for Discharge   Problem: Nutrition: Goal: Adequate nutrition will be maintained Outcome: Adequate for Discharge   Problem: Coping: Goal: Level of anxiety will decrease Outcome: Adequate for Discharge   Problem: Elimination: Goal: Will not experience complications related to bowel motility Outcome: Adequate for Discharge Goal: Will not experience complications related to urinary retention Outcome: Adequate for Discharge   Problem: Pain Managment: Goal: General experience of comfort will improve Outcome: Adequate for Discharge   Problem: Safety: Goal: Ability to remain free from injury will improve Outcome: Adequate for Discharge   Problem: Skin Integrity: Goal: Risk for impaired skin integrity will decrease Outcome: Adequate for Discharge   Problem: Education: Goal: Required Educational Video(s) Outcome: Adequate for Discharge   Problem: Clinical Measurements: Goal: Ability to maintain clinical measurements within normal limits will improve Outcome: Adequate for  Discharge Goal: Postoperative complications will be avoided or minimized Outcome: Adequate for Discharge   Problem: Skin Integrity: Goal: Demonstration of wound healing without infection will improve Outcome: Adequate for Discharge   

## 2021-09-25 NOTE — Progress Notes (Signed)
SATURATION SCREEN:  Patient Saturations on Room Air at Rest = 98 %  Patient Saturations on Room Air while Ambulating = 96%

## 2021-09-25 NOTE — Hospital Course (Signed)
51 year old African can female BMI 29.5, prior tobacco with COPD/?  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

## 2021-09-25 NOTE — Progress Notes (Signed)
Pt tolerated RA while eating and talking. When lying flat and falling asleep her O2 dipped to 88% and improved with the 2L.  Pt c/o of decreased sensation and "pins and needles" in bilateral feet which has developed last month or so. She stated she was planning on following up with primary. RN educated effects of hypokalemia and encouraged to follow=up with primary.

## 2021-09-25 NOTE — Discharge Summary (Signed)
Date of admission: 09/23/2021  Date of discharge: 09/25/2021  Admission diagnosis: Urethral diverticulum with gross hematuria  Discharge diagnosis: Same  Secondary diagnoses: Postoperative hypoxia  Procedures: Cystoscopy with clot evacuation and fulguration of urethra with Dr. Junious Silk on 09/24/2021  History and Physical: For full details, please see admission history and physical. Briefly, Robin Fischer is a 51 y.o. year old patient status post cystoscopy with clot evacuation and fulguration of urethra with Dr. Junious Silk on 09/24/2021.   Hospital Course: Postoperatively, the patient was monitored on the floor.  She had a Foley catheter in place following surgery that was draining clear urine.  Her hypoxia significantly improved at rest and with ambulation.  She was discharged home with her Foley catheter.    Physical Exam:  General: Alert and oriented CV: RRR, palpable distal pulses Lungs: CTAB, equal chest rise Abdomen: Soft, NTND, no rebound or guarding GU: Foley catheter in place and raining clear-yellow urine Ext: NT, No erythema  Laboratory values:  Recent Labs    09/23/21 1507 09/24/21 0604 09/25/21 0442  HGB 15.4* 14.0 13.3  HCT 42.4 38.6 37.5   Recent Labs    09/24/21 0604 09/25/21 0442  CREATININE 0.54 0.51    Disposition: Home  Discharge instruction: The patient was instructed to be ambulatory but told to refrain from heavy lifting, strenuous activity, or driving.  Discharge medications:  Allergies as of 09/25/2021   No Known Allergies      Medication List     TAKE these medications    acetaminophen 325 MG tablet Commonly known as: Tylenol Take 2 tablets (650 mg total) by mouth every 6 (six) hours as needed. What changed: reasons to take this   ALPRAZolam 0.25 MG tablet Commonly known as: XANAX Take 1 tablet (0.25 mg total) by mouth at bedtime as needed for anxiety or sleep.   amLODipine 10 MG tablet Commonly known as: NORVASC TAKE 1 (ONE) TABLET  (10 MG TOTAL) BY MOUTH DAILY. What changed: See the new instructions.   atorvastatin 40 MG tablet Commonly known as: LIPITOR Take 1 tablet (40 mg total) by mouth daily.   Blood Pressure Monitor Kit 1 Bag by Does not apply route 3 (three) times daily as needed.   budesonide 180 MCG/ACT inhaler Commonly known as: PULMICORT Inhale 2 puffs into the lungs 2 (two) times daily. What changed:  when to take this reasons to take this   cephALEXin 500 MG capsule Commonly known as: KEFLEX Take 1 capsule (500 mg total) by mouth at bedtime. What changed: when to take this   cetirizine 10 MG tablet Commonly known as: ZYRTEC TAKE 1 TABLET (10 MG TOTAL) BY MOUTH DAILY. What changed:  when to take this reasons to take this   famotidine 20 MG tablet Commonly known as: PEPCID TAKE 1 TABLET (20 MG TOTAL) BY MOUTH DAILY.   fluticasone 50 MCG/ACT nasal spray Commonly known as: FLONASE Place 1 spray into both nostrils daily. What changed:  when to take this reasons to take this   Olopatadine HCl 0.2 % Soln Place 1 drop into both eyes every morning.   phenazopyridine 200 MG tablet Commonly known as: PYRIDIUM Take 1 tablet (200 mg total) by mouth 3 (three) times daily.   ProAir HFA 108 (90 Base) MCG/ACT inhaler Generic drug: albuterol INHALE 2 PUFFS INTO THE LUNGS EVERY 6 (SIX) HOURS AS NEEDED FOR WHEEZING OR SHORTNESS OF BREATH. What changed: when to take this   Restasis 0.05 % ophthalmic emulsion Generic drug: cycloSPORINE Place 1  drop into both eyes 2 (two) times daily.   traZODone 100 MG tablet Commonly known as: DESYREL TAKE ONE TABLET BY MOUTH DAILY AT BEDTIME. What changed:  when to take this reasons to take this        Followup:   Follow-up Information     [name], MD Follow up.   Specialty: Urology Why: Office will call with appointment Contact information: [place] [address] [phone]

## 2021-09-28 ENCOUNTER — Encounter (INDEPENDENT_AMBULATORY_CARE_PROVIDER_SITE_OTHER): Payer: Self-pay | Admitting: Primary Care

## 2021-09-28 ENCOUNTER — Ambulatory Visit (INDEPENDENT_AMBULATORY_CARE_PROVIDER_SITE_OTHER): Payer: Medicaid Other | Admitting: Primary Care

## 2021-09-28 ENCOUNTER — Other Ambulatory Visit: Payer: Self-pay

## 2021-09-28 VITALS — BP 110/75 | HR 79 | Temp 97.8°F | Ht 67.0 in | Wt 194.4 lb

## 2021-09-28 DIAGNOSIS — Z23 Encounter for immunization: Secondary | ICD-10-CM

## 2021-09-28 DIAGNOSIS — I10 Essential (primary) hypertension: Secondary | ICD-10-CM

## 2021-09-28 DIAGNOSIS — Z131 Encounter for screening for diabetes mellitus: Secondary | ICD-10-CM

## 2021-09-28 DIAGNOSIS — R202 Paresthesia of skin: Secondary | ICD-10-CM

## 2021-09-28 DIAGNOSIS — K219 Gastro-esophageal reflux disease without esophagitis: Secondary | ICD-10-CM

## 2021-09-28 LAB — POCT GLYCOSYLATED HEMOGLOBIN (HGB A1C): Hemoglobin A1C: 4.9 % (ref 4.0–5.6)

## 2021-09-28 MED ORDER — AMLODIPINE BESYLATE 10 MG PO TABS: ORAL_TABLET | ORAL | 1 refills | Status: DC

## 2021-09-28 MED ORDER — GABAPENTIN 100 MG PO CAPS: 100.0000 mg | ORAL_CAPSULE | Freq: Three times a day (TID) | ORAL | 3 refills | Status: DC

## 2021-09-28 MED ORDER — FAMOTIDINE 20 MG PO TABS: 20.0000 mg | ORAL_TABLET | Freq: Every day | ORAL | 1 refills | Status: DC

## 2021-09-28 NOTE — Patient Instructions (Signed)
Influenza, Adult °Influenza is also called "the flu." It is an infection in the lungs, nose, and throat (respiratory tract). It spreads easily from person to person (is contagious). The flu causes symptoms that are like a cold, along with high fever and body aches. °What are the causes? °This condition is caused by the influenza virus. You can get the virus by: °Breathing in droplets that are in the air after a person infected with the flu coughed or sneezed. °Touching something that has the virus on it and then touching your mouth, nose, or eyes. °What increases the risk? °Certain things may make you more likely to get the flu. These include: °Not washing your hands often. °Having close contact with many people during cold and flu season. °Touching your mouth, eyes, or nose without first washing your hands. °Not getting a flu shot every year. °You may have a higher risk for the flu, and serious problems, such as a lung infection (pneumonia), if you: °Are older than 65. °Are pregnant. °Have a weakened disease-fighting system (immune system) because of a disease or because you are taking certain medicines. °Have a long-term (chronic) condition, such as: °Heart, kidney, or lung disease. °Diabetes. °Asthma. °Have a liver disorder. °Are very overweight (morbidly obese). °Have anemia. °What are the signs or symptoms? °Symptoms usually begin suddenly and last 4-14 days. They may include: °Fever and chills. °Headaches, body aches, or muscle aches. °Sore throat. °Cough. °Runny or stuffy (congested) nose. °Feeling discomfort in your chest. °Not wanting to eat as much as normal. °Feeling weak or tired. °Feeling dizzy. °Feeling sick to your stomach or throwing up. °How is this treated? °If the flu is found early, you can be treated with antiviral medicine. This can help to reduce how bad the illness is and how long it lasts. This may be given by mouth or through an IV tube. °Taking care of yourself at home can help your  symptoms get better. Your doctor may want you to: °Take over-the-counter medicines. °Drink plenty of fluids. °The flu often goes away on its own. If you have very bad symptoms or other problems, you may be treated in a hospital. °Follow these instructions at home: °  °Activity °Rest as needed. Get plenty of sleep. °Stay home from work or school as told by your doctor. °Do not leave home until you do not have a fever for 24 hours without taking medicine. °Leave home only to go to your doctor. °Eating and drinking °Take an ORS (oral rehydration solution). This is a drink that is sold at pharmacies and stores. °Drink enough fluid to keep your pee pale yellow. °Drink clear fluids in small amounts as you are able. Clear fluids include: °Water. °Ice chips. °Fruit juice mixed with water. °Low-calorie sports drinks. °Eat bland foods that are easy to digest. Eat small amounts as you are able. These foods include: °Bananas. °Applesauce. °Rice. °Lean meats. °Toast. °Crackers. °Do not eat or drink: °Fluids that have a lot of sugar or caffeine. °Alcohol. °Spicy or fatty foods. °General instructions °Take over-the-counter and prescription medicines only as told by your doctor. °Use a cool mist humidifier to add moisture to the air in your home. This can make it easier for you to breathe. °When using a cool mist humidifier, clean it daily. Empty water and replace with clean water. °Cover your mouth and nose when you cough or sneeze. °Wash your hands with soap and water often and for at least 20 seconds. This is also important after   you cough or sneeze. If you cannot use soap and water, use alcohol-based hand sanitizer. Keep all follow-up visits. How is this prevented?  Get a flu shot every year. You may get the flu shot in late summer, fall, or winter. Ask your doctor when you should get your flu shot. Avoid contact with people who are sick during fall and winter. This is cold and flu season. Contact a doctor if: You get  new symptoms. You have: Chest pain. Watery poop (diarrhea). A fever. Your cough gets worse. You start to have more mucus. You feel sick to your stomach. You throw up. Get help right away if you: Have shortness of breath. Have trouble breathing. Have skin or nails that turn a bluish color. Have very bad pain or stiffness in your neck. Get a sudden headache. Get sudden pain in your face or ear. Cannot eat or drink without throwing up. These symptoms may represent a serious problem that is an emergency. Get medical help right away. Call your local emergency services (911 in the [place] Do not wait to see if the symptoms will go away. Do not drive yourself to the hospital. Summary Influenza is also called "the flu." It is an infection in the lungs, nose, and throat. It spreads easily from person to person. Take over-the-counter and prescription medicines only as told by your doctor. Getting a flu shot every year is the best way to not get the flu. This information is not intended to replace advice given to you by your health care provider. Make sure you discuss any questions you have with your health care provider. Document Revised: 04/10/2020 Document Reviewed: 04/10/2020 Elsevier Patient Education  2022 [employer] Inc. Zoster Vaccine, Recombinant injection What is this medication? ZOSTER VACCINE (ZOS ter vak SEEN) is a vaccine used to reduce the risk of getting shingles. This vaccine is not used to treat shingles or nerve pain from shingles. This medicine may be used for other purposes; ask your health care provider or pharmacist if you have questions. COMMON BRAND NAME(S): [facility] What should I tell my care team before I take this medication? They need to know if you have any of these conditions: cancer immune system problems an unusual or allergic reaction to Zoster vaccine, other medications, foods, dyes, or preservatives pregnant or trying to get pregnant breast-feeding How  should I use this medication? This vaccine is injected into a muscle. It is given by a health care provider. A copy of Vaccine Information Statements will be given before each vaccination. Be sure to read this information carefully each time. This sheet may change often. Talk to your health care provider about the use of this vaccine in children. This vaccine is not approved for use in children. Overdosage: If you think you have taken too much of this medicine contact a poison control center or emergency room at once. NOTE: This medicine is only for you. Do not share this medicine with others. What if I miss a dose? Keep appointments for follow-up (booster) doses. It is important not to miss your dose. Call your health care provider if you are unable to keep an appointment. What may interact with this medication? medicines that suppress your immune system medicines to treat cancer steroid medicines like prednisone or cortisone This list may not describe all possible interactions. Give your health care provider a list of all the medicines, herbs, non-prescription drugs, or dietary supplements you use. Also tell them if you smoke, drink alcohol, or use illegal  drugs. Some items may interact with your medicine. What should I watch for while using this medication? Visit your health care provider regularly. This vaccine, like all vaccines, may not fully protect everyone. What side effects may I notice from receiving this medication? Side effects that you should report to your doctor or health care professional as soon as possible: allergic reactions (skin rash, itching or hives; swelling of the face, lips, or tongue) trouble breathing Side effects that usually do not require medical attention (report these to your doctor or health care professional if they continue or are bothersome): chills headache fever nausea pain, redness, or irritation at site where injected tiredness vomiting This list  may not describe all possible side effects. Call your doctor for medical advice about side effects. You may report side effects to FDA at 1-800-FDA-1088. Where should I keep my medication? This vaccine is only given by a health care provider. It will not be stored at home. NOTE: This sheet is a summary. It may not cover all possible information. If you have questions about this medicine, talk to your doctor, pharmacist, or health care provider.  2022 Elsevier/Gold Standard (2021-05-11 00:00:00)

## 2021-09-28 NOTE — Progress Notes (Signed)
Complains of feet feeling numb

## 2021-10-03 NOTE — Progress Notes (Signed)
[name], is a 51 y.o. female  TOI:[id number]  [id number]:[id number]  DOB - Nov 04, 1970  Chief Complaint  Patient presents with   Hypertension       Subjective:   [name] is a 51 y.o. female here today for a follow up management of HTN. Patient has No headache, No chest pain, No abdominal pain - No Nausea, , No Cough - SOB. She does complain of  weakness tingling or numbness.  No problems updated.  ALLERGIES: No Known Allergies  PAST MEDICAL HISTORY: Past Medical History:  Diagnosis Date   Asthma    Bronchitis    GERD (gastroesophageal reflux disease)    High cholesterol    Hypertension    Pancreatitis    Tobacco abuse     MEDICATIONS AT HOME: Prior to Admission medications   Medication Sig Start Date End Date Taking? Authorizing Provider  budesonide (PULMICORT) 180 MCG/ACT inhaler Inhale 2 puffs into the lungs 2 (two) times daily. Patient taking differently: Inhale 2 puffs into the lungs 2 (two) times daily as needed (shortness of breath). 09/26/19  Yes [name], NP  cephALEXin (KEFLEX) 500 MG capsule Take 1 capsule (500 mg total) by mouth at bedtime. Patient taking differently: Take 500 mg by mouth at bedtime. For 5 days 09/24/21  Yes [name], MD  cetirizine (ZYRTEC) 10 MG tablet TAKE 1 TABLET (10 MG TOTAL) BY MOUTH DAILY. Patient taking differently: Take 10 mg by mouth daily as needed for allergies or rhinitis. 09/24/20  Yes [name], NP  fluticasone (FLONASE) 50 MCG/ACT nasal spray Place 1 spray into both nostrils daily. Patient taking differently: Place 1 spray into both nostrils daily as needed for allergies or rhinitis. 05/07/20  Yes [name], [name], PA-C  gabapentin (NEURONTIN) 100 MG capsule Take 1 capsule (100 mg total) by mouth 3 (three) times daily. 09/28/21  Yes [name], NP  Olopatadine HCl 0.2 % SOLN Place 1 drop into both eyes every morning.  04/03/20  Yes [provider]   PROAIR HFA 108 (90 Base) MCG/ACT inhaler INHALE 2 PUFFS INTO THE LUNGS EVERY 6 (SIX) HOURS AS NEEDED FOR WHEEZING OR SHORTNESS OF BREATH. Patient taking differently: Inhale 2 puffs into the lungs every 4 (four) hours as needed for wheezing or shortness of breath. 07/02/20  Yes [name], [name], MD  RESTASIS 0.05 % ophthalmic emulsion Place 1 drop into both eyes 2 (two) times daily. 02/28/20  Yes [provider]  amLODipine (NORVASC) 10 MG tablet TAKE 1 (ONE) TABLET (10 MG TOTAL) BY MOUTH DAILY. 09/28/21   [name], NP  Blood Pressure Monitor KIT 1 Bag by Does not apply route 3 (three) times daily as needed. 09/26/19   [name], NP  famotidine (PEPCID) 20 MG tablet Take 1 tablet (20 mg total) by mouth daily. 09/28/21   [name], NP    Objective:   Vitals:   09/28/21 1448  BP: 110/75  Pulse: 79  Temp: 97.8 F (36.6 C)  TempSrc: Oral  Weight: 194 lb 6.4 oz (88.2 kg)  Height: '5\' 7"'  (1.702 m)   Exam General appearance : Awake, alert, not in any distress. Speech Clear. Not toxic looking HEENT: Atraumatic and Normocephalic, pupils equally reactive to light and accomodation Neck: Supple, no JVD. No cervical lymphadenopathy.  Chest: Good air entry bilaterally, no added sounds  CVS: S1 S2 regular, no murmurs.  Abdomen: Bowel sounds present, Non tender and not distended with no  gaurding, rigidity or rebound. Extremities: B/L Lower Ext shows no edema, both legs are warm to touch Neurology: Awake alert, and oriented X 3, CN II-XII intact, Non focal Skin: No Rash  Data Review Lab Results  Component Value Date   HGBA1C 4.9 09/28/2021   HGBA1C 5.0 12/27/2019    Assessment & Plan   1. Screening for diabetes mellitus  - HgB A1c 4.9 per ADA guidelines not a pre/diabetic   2. Need for shingles vaccine - Varicella-zoster vaccine IM (Shingrix)  3. Need for immunization against influenza - Flu Vaccine QUAD 25moIM (Fluarix, Fluzone & Alfiuria Quad  PF)  4. Gastroesophageal reflux disease without esophagitis Discussed eating small frequent meal, reduction in acidic foods, fried foods ,spicy foods, alcohol caffeine and tobacco and certain medications. Avoid laying down after eating 332ms-1hour, elevated head of the bed.  - famotidine (PEPCID) 20 MG tablet; Take 1 tablet (20 mg total) by mouth daily.  Dispense: 90 tablet; Refill: 1  5. Paresthesias pricking, tingling, and numbness in any section of the body.with sensations of itching, burning, tingling, prickling, or numbness.  - gabapentin (NEURONTIN) 100 MG capsule; Take 1 capsule (100 mg total) by mouth 3 (three) times daily.  Dispense: 90 capsule; Refill: 3  6. Primary hypertension Counseled on blood pressure goal of less than 130/80, low-sodium, DASH diet, medication compliance, 150 minutes of moderate intensity exercise per week. Discussed medication compliance, adverse effects.  - amLODipine (NORVASC) 10 MG tablet; TAKE 1 (ONE) TABLET (10 MG TOTAL) BY MOUTH DAILY.  Dispense: 90 tablet; Refill: 1  Patient have been counseled extensively about nutrition and exercise. Other issues discussed during this visit include: low cholesterol diet, weight control and daily exercise, foot care, annual eye examinations at Ophthalmology, importance of adherence with medications and regular follow-up. We also discussed long term complications of uncontrolled diabetes and hypertension.   Return in about 6 weeks (around 11/09/2021) for fasting labs and f/u on neurapthy .  The patient was given clear instructions to go to ER or return to medical center if symptoms don't improve, worsen or new problems develop. The patient verbalized understanding. The patient was told to call to get lab results if they haven't heard anything in the next week.   This note has been created with [employer], quantityAny transcriptional errors are unintentional.   MiKerin PernaNP 10/03/2021, 5:20 PM

## 2021-10-27 ENCOUNTER — Telehealth: Payer: Self-pay

## 2021-10-27 NOTE — Telephone Encounter (Signed)
Patient contacted to schedule mammogram.   RE: Mobile Mammo event located at:  [facility] [address] [place] [address]     Date: April 7th at 8:50am    [place] has been reset per patient request, as well as, Pharmacist, community message sent with date, address and time of appointment.   Patient stated that she will have [facility] transport take her to the event.

## 2021-11-11 ENCOUNTER — Other Ambulatory Visit: Payer: Self-pay

## 2021-11-11 ENCOUNTER — Encounter (INDEPENDENT_AMBULATORY_CARE_PROVIDER_SITE_OTHER): Payer: Self-pay | Admitting: Primary Care

## 2021-11-11 ENCOUNTER — Ambulatory Visit (INDEPENDENT_AMBULATORY_CARE_PROVIDER_SITE_OTHER): Payer: Medicaid Other | Admitting: Primary Care

## 2021-11-11 VITALS — BP 149/88 | HR 82 | Temp 97.7°F | Ht 67.0 in | Wt 192.8 lb

## 2021-11-11 DIAGNOSIS — R202 Paresthesia of skin: Secondary | ICD-10-CM

## 2021-11-11 DIAGNOSIS — R601 Generalized edema: Secondary | ICD-10-CM

## 2021-11-11 DIAGNOSIS — I10 Essential (primary) hypertension: Secondary | ICD-10-CM

## 2021-11-11 DIAGNOSIS — J320 Chronic maxillary sinusitis: Secondary | ICD-10-CM

## 2021-11-11 DIAGNOSIS — Z1211 Encounter for screening for malignant neoplasm of colon: Secondary | ICD-10-CM | POA: Diagnosis not present

## 2021-11-11 MED ORDER — BUDESONIDE 180 MCG/ACT IN AEPB: 2.0000 | INHALATION_SPRAY | Freq: Two times a day (BID) | RESPIRATORY_TRACT | 5 refills | Status: DC

## 2021-11-11 MED ORDER — GABAPENTIN 300 MG PO CAPS: 300.0000 mg | ORAL_CAPSULE | Freq: Two times a day (BID) | ORAL | 1 refills | Status: DC

## 2021-11-11 MED ORDER — PROAIR HFA 108 (90 BASE) MCG/ACT IN AERS: 2.0000 | INHALATION_SPRAY | Freq: Four times a day (QID) | RESPIRATORY_TRACT | 0 refills | Status: DC | PRN

## 2021-11-11 MED ORDER — FLUCONAZOLE 150 MG PO TABS: 150.0000 mg | ORAL_TABLET | Freq: Every day | ORAL | 1 refills | Status: DC

## 2021-11-11 MED ORDER — FLUTICASONE PROPIONATE 50 MCG/ACT NA SUSP
1.0000 | Freq: Every day | NASAL | 0 refills | Status: DC
Start: 2021-11-11 — End: 2022-02-07

## 2021-11-11 MED ORDER — AMOXICILLIN-POT CLAVULANATE 875-125 MG PO TABS: 1.0000 | ORAL_TABLET | Freq: Two times a day (BID) | ORAL | 0 refills | Status: DC

## 2021-11-11 MED ORDER — HYDROCHLOROTHIAZIDE 25 MG PO TABS: 25.0000 mg | ORAL_TABLET | Freq: Every day | ORAL | 3 refills | Status: DC

## 2021-11-11 MED ORDER — CETIRIZINE HCL 10 MG PO TABS: 10.0000 mg | ORAL_TABLET | Freq: Every day | ORAL | 1 refills | Status: DC

## 2021-11-11 NOTE — Patient Instructions (Signed)
Hydrochlorothiazide, HCTZ; Methyldopa Oral Tablets ?What is this medication? ?HYDROCHLOROTHIAZIDE; METHYLDOPA (hye droe klor oh THYE a zide; meth ill DOE pa) is a combination of 2 medicines that treat high blood pressure. ?This medicine may be used for other purposes; ask your health care provider or pharmacist if you have questions. ?COMMON BRAND NAME(S): Aldoril, Aldoril D ?What should I tell my care team before I take this medication? ?They need to know if you have any of these conditions: ?anemia ?diabetes ?gout ?immune system problems, like lupus ?kidney disease ?liver disease ?small amount of urine or difficulty passing urine ?taken an MAOI like Carbex, Eldepryl, Marplan, Nardil, or Parnate in the last 14 days ?an unusual or allergic reaction to hydrochlorothiazide, methyldopa, sulfa drugs, other medicines, foods, dyes, or preservatives ?pregnant or trying to get pregnant ?breast-feeding ?How should I use this medication? ?Take this drug by mouth. Take it as directed on the prescription label at the same time every day. You can take it with or without food. If it upsets your stomach, take it with food. Keep taking it unless your health care provider tells you to stop. ?Talk to your health care provider about the use of this drug in children. Special care may be needed. ?Overdosage: If you think you have taken too much of this medicine contact a poison control center or emergency room at once. ?NOTE: This medicine is only for you. Do not share this medicine with others. ?What if I miss a dose? ?If you miss a dose, take it as soon as you can. If it is almost time for your next dose, take only that dose. Do not take double or extra doses. ?What may interact with this medication? ?Do not take this medicine with any of the following medications: ?MAOIs like Carbex, Eldepryl, Marplan, Nardil, and Parnate ?This medicine also may interact with the following medications: ?alcohol ?barbiturates, like  phenobarbital ?cholestyramine ?colestipol ?digoxin ?diuretics ?iron salts ?lithium ?medicines for anesthesia ?medicines for blood pressure ?medicines for diabetes ?narcotic medicines used for pain ?NSAIDS, medicines for pain and inflammation, like ibuprofen or naproxen ?medicines used to relax muscles during surgery ?steroid medicines like prednisone or cortisone ?This list may not describe all possible interactions. Give your health care provider a list of all the medicines, herbs, non-prescription drugs, or dietary supplements you use. Also tell them if you smoke, drink alcohol, or use illegal drugs. Some items may interact with your medicine. ?What should I watch for while using this medication? ?Visit your doctor for regular checks on your progress. Check your blood pressure as directed. Ask your doctor what your blood pressure should be and when you should contact him or her. ?You may need to be on a special diet while taking this medicine. Ask your doctor. ?Check with your doctor if you get an attack of severe diarrhea, nausea and vomiting, or if you sweat a lot. The loss of too much body fluid can make it dangerous for you to take this medicine. ?You may get drowsy or dizzy. Do not drive, use machinery, or do anything that needs mental alertness until you know how this medicine affects you. Do not stand or sit up quickly, especially if you are an older patient. This reduces the risk of dizzy or fainting spells. Alcohol may interfere with the effect of this medicine. Avoid alcoholic drinks. ?Talk to your health care professional about your risk of skin cancer. You may be more at risk for skin cancer if you take this medicine. ?This medicine  can make you more sensitive to the sun. Keep out of the sun. If you cannot avoid being in the sun, wear protective clothing and use sunscreen. Do not use sun lamps or tanning beds/booths. ?This medicine may increase blood sugar. Ask your healthcare provider if changes in  diet or medicines are needed if you have diabetes. ?Your mouth may get dry. Chewing sugarless gum or sucking hard candy, and drinking plenty of water will help. Contact your doctor if the problem does not go away or is severe. ?Iron can stop the absorption of this medicine. Do not take this medicine with iron preparations or multiple vitamins containing iron. If you have to take iron, make sure that you take this medicine 2 hours before or 6 hours after the iron. ?What side effects may I notice from receiving this medication? ?Side effects that you should report to your doctor or health care professional as soon as possible: ?allergic reactions like skin rash, itching or hives, swelling of the face, lips, or tongue ?breathing problems ?changes in vision ?chest pain ?dark urine ?dizziness ?eye pain ?general ill feeling or flu-like symptoms ?irregular heartbeat ?light-colored stools ?loss of appetite ?muscle pain or weakness, cramps ?nausea, vomiting ?redness, blistering, peeling or loosening of the skin, including inside the mouth ?right upper belly pain ?signs and symptoms of high blood sugar such as being more thirsty or hungry or having to urinate more than normal. You may also feel very tired or have blurry vision. ?swelling of the feet or legs ?trouble passing urine ?unusual bleeding or bruising ?unusually weak ?yellowing of the eyes or skin ?Side effects that usually do not require medical attention (report to your doctor or health care professional if they continue or are bothersome): ?abnormal production of milk in females ?breast enlargement in both males and females ?change in sex drive or performance ?depressed mood ?diarrhea ?drowsiness ?dry mouth ?increased sensitivity to the sun ?This list may not describe all possible side effects. Call your doctor for medical advice about side effects. You may report side effects to FDA at 1-800-FDA-1088. ?Where should I keep my medication? ?Keep out of the reach of  children and pets. ?Store at room temperature between 20 and 25 degrees C (68 and 77 degrees F). Protect from light. Throw away any unused drug after the expiration date. ?NOTE: This sheet is a summary. It may not cover all possible information. If you have questions about this medicine, talk to your doctor, pharmacist, or health care provider. ?? 2022 Elsevier/Gold Standard (2021-05-11 00:00:00) ? ?

## 2021-11-11 NOTE — Progress Notes (Signed)
? ?[place] ? ?Subjective: ?CC: back pain ?PCP: [name], NP ?HPI: ?Patient is a 51 y.o. female presenting to clinic today for back pain. ?Concerns today include: ? ?1. Back Pain ?Patient reports that pain began 4 day.  No h/o back pain.  Pain is a 8/10.  It does not  radiate.  Walking worsens pain.  Lay down  improves pain.  Patient has been taking nothing for pain   Patient does not  dysuria, hematuria, fevers, chills, nausea, vomiting, abdominal pain, renal stones. Aggravating factors: certain movements and prolonged walking/standing. Alleviating factors: rest. Progressive LE weakness or saddle anesthesia: none. Extremity sensation changes or weakness: none. Ambulatory without difficulty. Normal bowel/bladder habits: yes; without urinary retention. Normal PO intake without n/v. No associated abdominal pain/n/v. Self treatment: has OTC analgesics, with minimal relief. Patient urinary retention/incontinence, ( followed by urology no bowel incontinence, weakness, falls, sensation changes or pain anywhere else. No h/o back surgeries. ? ? ? ?Current Outpatient Medications:  ?  amLODipine (NORVASC) 10 MG tablet, TAKE 1 (ONE) TABLET (10 MG TOTAL) BY MOUTH DAILY., Disp: 90 tablet, Rfl: 1 ?  amoxicillin-clavulanate (AUGMENTIN) 875-125 MG tablet, Take 1 tablet by mouth 2 (two) times daily., Disp: 20 tablet, Rfl: 0 ?  Blood Pressure Monitor KIT, 1 Bag by Does not apply route 3 (three) times daily as needed., Disp: 1 kit, Rfl: 0 ?  famotidine (PEPCID) 20 MG tablet, Take 1 tablet (20 mg total) by mouth daily., Disp: 90 tablet, Rfl: 1 ?  fluconazole (DIFLUCAN) 150 MG tablet, Take 1 tablet (150 mg total) by mouth daily., Disp: 1 tablet, Rfl: 1 ?  hydrochlorothiazide (HYDRODIURIL) 25 MG tablet, Take 1 tablet (25 mg total) by mouth daily., Disp: 90 tablet, Rfl: 3 ?  Olopatadine HCl 0.2 % SOLN, Place 1 drop into both eyes every morning. , Disp: , Rfl:  ?  RESTASIS 0.05 % ophthalmic emulsion, Place 1  drop into both eyes 2 (two) times daily., Disp: , Rfl:  ?  budesonide (PULMICORT) 180 MCG/ACT inhaler, Inhale 2 puffs into the lungs 2 (two) times daily., Disp: 1 each, Rfl: 5 ?  cetirizine (ZYRTEC) 10 MG tablet, Take 1 tablet (10 mg total) by mouth daily., Disp: 90 tablet, Rfl: 1 ?  fluticasone (FLONASE) 50 MCG/ACT nasal spray, Place 1 spray into both nostrils daily., Disp: 16 g, Rfl: 0 ?  gabapentin (NEURONTIN) 300 MG capsule, Take 1 capsule (300 mg total) by mouth 2 (two) times daily., Disp: 180 capsule, Rfl: 1 ?  PROAIR HFA 108 (90 Base) MCG/ACT inhaler, Inhale 2 puffs into the lungs every 6 (six) hours as needed for wheezing or shortness of breath., Disp: 8.5 g, Rfl: 0 ?No Known Allergies ? ?Past Medical History:  ?Diagnosis Date  ? Asthma   ? Bronchitis   ? GERD (gastroesophageal reflux disease)   ? High cholesterol   ? Hypertension   ? Pancreatitis   ? Tobacco abuse   ? ?Social History  ? ?Socioeconomic History  ? Marital status: Single  ?  Spouse name: Not on file  ? Number of children: Not on file  ? Years of education: Not on file  ? Highest education level: Not on file  ?Occupational History  ? Not on file  ?Tobacco Use  ? Smoking status: Every Day  ? Smokeless tobacco: Never  ?Substance and Sexual Activity  ? Alcohol use: Yes  ?  Comment: beer  ? Drug use: No  ? Sexual activity: Not on file  ?  Other Topics Concern  ? Not on file  ?Social History Narrative  ? Not on file  ? ?Social Determinants of Health  ? ?Financial Resource Strain: Not on file  ?Food Insecurity: Not on file  ?Transportation Needs: Not on file  ?Physical Activity: Not on file  ?Stress: Not on file  ?Social Connections: Not on file  ?Intimate Partner Violence: Not on file  ? ?Past Surgical History:  ?Procedure Laterality Date  ? TRANSURETHRAL RESECTION OF BLADDER TUMOR WITH MITOMYCIN-C N/A 09/24/2021  ? Procedure: CYSTOSCOPY; CLOT EVACUATION; FULGERATION OF URETHRA;  Surgeon: [name], MD;  Location: [facility];  Service: Urology;   Laterality: N/A;  ? ? ?ROS: per HPI ? ?Objective: ?Office vital signs reviewed. ?.BP (!) 149/88 (BP Location: Left Arm, Patient Position: Sitting, Cuff Size: Normal)   Pulse 82   Temp 97.7 ?F (36.5 ?C) (Temporal)   Ht _0  (1.702 m)   Wt 192 lb 12.8 oz (87.5 kg)   SpO2 100%   BMI 30.20 kg/m?   ? ?Physical Examination:  ? ? ? ?Assessment/ Plan: ?Robin Fischer was seen today for peripheral neuropathy. ? ?Diagnoses and all orders for this visit: ? ?Primary hypertension ?BP goal - < 130/80 ?Explained that having normal blood pressure is the goal and medications are helping to get to goal and maintain normal blood pressure. ?DIET: Limit salt intake, read nutrition labels to check salt content, limit fried and high fatty foods  ?Avoid using multisymptom OTC cold preparations that generally contain sudafed which can rise BP. Consult with pharmacist on best cold relief products to use for persons with HTN ?EXERCISE ?Discussed incorporating exercise such as walking - 30 minutes most days of the week and can do in 10 minute intervals    ?-     hydrochlorothiazide (HYDRODIURIL) 25 MG tablet; Take 1 tablet (25 mg total) by mouth daily. ? ?Paresthesias ?-     gabapentin (NEURONTIN) 300 MG capsule; Take 1 capsule (300 mg total) by mouth 2 (two) times daily. ? ?Generalized edema ?LE recommended ted hose and HCTZ decrease sodium ? ?Colon cancer screening ?-     Cologuard ? ?Chronic maxillary sinusitis ?-     amoxicillin-clavulanate (AUGMENTIN) 875-125 MG tablet; Take 1 tablet by mouth 2 (two) times daily. ? ?Other orders ?-     cetirizine (ZYRTEC) 10 MG tablet; Take 1 tablet (10 mg total) by mouth daily. ?-     fluticasone (FLONASE) 50 MCG/ACT nasal spray; Place 1 spray into both nostrils daily. ?-     PROAIR HFA 108 (90 Base) MCG/ACT inhaler; Inhale 2 puffs into the lungs every 6 (six) hours as needed for wheezing or shortness of breath. ?-     budesonide (PULMICORT) 180 MCG/ACT inhaler; Inhale 2 puffs into the lungs 2 (two) times  daily. ?-     fluconazole (DIFLUCAN) 150 MG tablet; Take 1 tablet (150 mg total) by mouth daily. ? ?  ?Return in about 4 weeks (around 12/09/2021) for Bp f/u. ? ? ?The above assessment and management plan was discussed with the patient. The patient verbalized understanding of and has agreed to the management plan. Patient is aware to call the clinic if symptoms persist or worsen. Patient is aware when to return to the clinic for a follow-up visit. Patient educated on when it is appropriate to go to the emergency department.  ? ?This note has been created with Surveyor, quantity. Any transcriptional errors are unintentional.  ? ?[name],  NP ?11/13/2021, 11:10 PM  ?

## 2021-11-19 ENCOUNTER — Other Ambulatory Visit: Payer: Self-pay

## 2021-11-19 ENCOUNTER — Other Ambulatory Visit: Payer: Self-pay | Admitting: Pharmacist

## 2021-11-19 MED ORDER — ALBUTEROL SULFATE HFA 108 (90 BASE) MCG/ACT IN AERS
2.0000 | INHALATION_SPRAY | Freq: Four times a day (QID) | RESPIRATORY_TRACT | 2 refills | Status: DC | PRN
Start: 2021-11-19 — End: 2022-03-24

## 2021-12-02 ENCOUNTER — Other Ambulatory Visit: Payer: Self-pay | Admitting: Primary Care

## 2021-12-02 DIAGNOSIS — Z139 Encounter for screening, unspecified: Secondary | ICD-10-CM

## 2021-12-07 ENCOUNTER — Other Ambulatory Visit (INDEPENDENT_AMBULATORY_CARE_PROVIDER_SITE_OTHER): Payer: Self-pay | Admitting: Primary Care

## 2021-12-08 NOTE — Telephone Encounter (Signed)
Patient has cancelled for the mobile bus.  ?

## 2021-12-10 ENCOUNTER — Inpatient Hospital Stay: Admission: RE | Admit: 2021-12-10 | Payer: Medicaid Other | Source: Ambulatory Visit

## 2021-12-17 ENCOUNTER — Ambulatory Visit (INDEPENDENT_AMBULATORY_CARE_PROVIDER_SITE_OTHER): Payer: Medicaid Other | Admitting: Primary Care

## 2021-12-22 ENCOUNTER — Ambulatory Visit (INDEPENDENT_AMBULATORY_CARE_PROVIDER_SITE_OTHER): Payer: Medicaid Other | Admitting: Primary Care

## 2021-12-22 ENCOUNTER — Encounter (INDEPENDENT_AMBULATORY_CARE_PROVIDER_SITE_OTHER): Payer: Self-pay | Admitting: Primary Care

## 2021-12-22 VITALS — BP 168/71 | HR 93 | Temp 98.2°F | Ht 67.0 in | Wt 189.8 lb

## 2021-12-22 DIAGNOSIS — M545 Low back pain, unspecified: Secondary | ICD-10-CM | POA: Diagnosis not present

## 2021-12-22 DIAGNOSIS — I10 Essential (primary) hypertension: Secondary | ICD-10-CM

## 2021-12-22 DIAGNOSIS — J302 Other seasonal allergic rhinitis: Secondary | ICD-10-CM

## 2021-12-22 DIAGNOSIS — T7840XD Allergy, unspecified, subsequent encounter: Secondary | ICD-10-CM

## 2021-12-22 MED ORDER — AMLODIPINE BESYLATE 10 MG PO TABS: ORAL_TABLET | ORAL | 1 refills | Status: DC

## 2021-12-22 MED ORDER — VALSARTAN-HYDROCHLOROTHIAZIDE 160-25 MG PO TABS: 1.0000 | ORAL_TABLET | Freq: Every day | ORAL | 3 refills | Status: DC

## 2021-12-22 MED ORDER — LIDOCAINE 5 % EX PTCH: 1.0000 | MEDICATED_PATCH | CUTANEOUS | 0 refills | Status: DC

## 2021-12-22 MED ORDER — RESTASIS 0.05 % OP EMUL: 1.0000 [drp] | Freq: Two times a day (BID) | OPHTHALMIC | 1 refills | Status: DC

## 2021-12-23 NOTE — Progress Notes (Signed)
?[place] ? ?Robin Fischer, is a 51 y.o. female ? ?EHU:[id number] ? ?ZCH:[id number] ? ?DOB - 11/01/1970 ? ?Chief Complaint  ?Patient presents with  ? Hypertension  ?    ? ?Subjective:  ? ?Robin Fischer is a 51 y.o. female here today for a follow up on management of HTN. She does complained of low back pain chronic 7/10 aggravating factors are sitting and standing for periods of time. Alleviating factors leaning forward and rest.Itchy watery eyes (seasonal allergies)  Patient has No headache, No chest pain, No abdominal pain - No Nausea, No new weakness tingling or numbness, No Cough - shortness of breath ? ?No problems updated. ? ?No Known Allergies ? ?Past Medical History:  ?Diagnosis Date  ? Asthma   ? Bronchitis   ? GERD (gastroesophageal reflux disease)   ? High cholesterol   ? Hypertension   ? Pancreatitis   ? Tobacco abuse   ? ? ?Current Outpatient Medications on File Prior to Visit  ?Medication Sig Dispense Refill  ? albuterol (VENTOLIN HFA) 108 (90 Base) MCG/ACT inhaler Inhale 2 puffs into the lungs every 6 (six) hours as needed for wheezing or shortness of breath. 18 g 2  ? Blood Pressure Monitor KIT 1 Bag by Does not apply route 3 (three) times daily as needed. 1 kit 0  ? budesonide (PULMICORT) 180 MCG/ACT inhaler Inhale 2 puffs into the lungs 2 (two) times daily. 1 each 5  ? cetirizine (ZYRTEC) 10 MG tablet Take 1 tablet (10 mg total) by mouth daily. 90 tablet 1  ? famotidine (PEPCID) 20 MG tablet Take 1 tablet (20 mg total) by mouth daily. 90 tablet 1  ? fluconazole (DIFLUCAN) 150 MG tablet Take 1 tablet (150 mg total) by mouth daily. 1 tablet 1  ? fluticasone (FLONASE) 50 MCG/ACT nasal spray Place 1 spray into both nostrils daily. 16 g 0  ? gabapentin (NEURONTIN) 300 MG capsule Take 1 capsule (300 mg total) by mouth 2 (two) times daily. 180 capsule 1  ? Olopatadine HCl 0.2 % SOLN Place 1 drop into both eyes every morning.     ? ?No current facility-administered medications on file  prior to visit.  ? ?Comprehensive ROS Pertinent positive and negative noted in HPI   ?Objective:  ? ?Vitals:  ? 12/22/21 1034  ?BP: (!) 168/71  ?Pulse: 93  ?Temp: 98.2 ?F (36.8 ?C)  ?TempSrc: Oral  ?SpO2: 97%  ?Weight: 189 lb 12.8 oz (86.1 kg)  ?Height: '5\' 7"'  (1.702 m)  ? ? ?Exam ?General appearance : Awake, alert, not in any distress. Speech Clear. Not toxic looking ?HEENT: Atraumatic and Normocephalic, pupils equally reactive to light and accomodation ?Neck: Supple, no JVD. No cervical lymphadenopathy.  ?Chest: Good air entry bilaterally, no added sounds  ?CVS: S1 S2 regular, no murmurs.  ?Abdomen: Bowel sounds present, Non tender and not distended with no gaurding, rigidity or rebound. ?Extremities: B/L Lower Ext shows no edema, both legs are warm to touch ?Neurology: Awake alert, and oriented X 3, t, Non focal ?Skin: No Rash ? ?Data Review ?Lab Results  ?Component Value Date  ? HGBA1C 4.9 09/28/2021  ? HGBA1C 5.0 12/27/2019  ? ? ?Assessment & Plan  ? ?1. Primary hypertension ?Counseled on blood pressure goal of less than 130/80, low-sodium, DASH diet, medication compliance, 150 minutes of moderate intensity exercise per week. ?Discussed medication compliance, adverse effects.  ?New medication valsartan-hydrochlorothiazide (DIOVAN-HCT) 160-25 MG tablet; Take 1 tablet by mouth daily.  Dispense: 90 tablet; Refill: 3. D/c  HCTZ  ?- amLODipine (NORVASC) 10 MG tablet; TAKE 1 (ONE) TABLET (10 MG TOTAL) BY MOUTH DAILY.  Dispense: 90 tablet; Refill: 1 ? ?2. Allergy, subsequent encounter ?- RESTASIS 0.05 % ophthalmic emulsion; Place 1 drop into both eyes 2 (two) times daily.  Dispense: 0.4 mL; Refill: 1 ? ?3. Back pain at L4-L5 level ?- lidocaine (LIDODERM) 5 %; Place 1 patch onto the skin daily. Remove & Discard patch within 12 hours or as directed by MD  Dispense: 30 patch; Refill: 0 ? ? ? ?Patient have been counseled extensively about nutrition and exercise. Other issues discussed during this visit include: low  cholesterol diet, weight control and daily exercise, foot care, annual eye examinations at Ophthalmology, importance of adherence with medications and regular follow-up. We also discussed long term complications of uncontrolled diabetes and hypertension.  ? ?Return in about 2 months (around 02/21/2022) for Bp. ? ?The patient was given clear instructions to go to ER or return to medical center if symptoms don't improve, worsen or new problems develop. The patient verbalized understanding. The patient was told to call to get lab results if they haven't heard anything in the next week.  ? ?This note has been created with Surveyor, quantity. Any transcriptional errors are unintentional.  ? ?[name], NP ?12/23/2021, 2:22 PM  ?

## 2022-01-03 ENCOUNTER — Other Ambulatory Visit (INDEPENDENT_AMBULATORY_CARE_PROVIDER_SITE_OTHER): Payer: Self-pay | Admitting: Primary Care

## 2022-01-03 DIAGNOSIS — R202 Paresthesia of skin: Secondary | ICD-10-CM

## 2022-01-18 ENCOUNTER — Encounter (HOSPITAL_COMMUNITY): Payer: Self-pay

## 2022-01-18 ENCOUNTER — Emergency Department (HOSPITAL_COMMUNITY)
Admission: EM | Admit: 2022-01-18 | Discharge: 2022-01-18 | Disposition: A | Discharge status: Home / Self Care | Payer: Medicaid Other | Attending: Emergency Medicine | Admitting: Emergency Medicine

## 2022-01-18 ENCOUNTER — Other Ambulatory Visit: Payer: Self-pay

## 2022-01-18 DIAGNOSIS — Z79899 Other long term (current) drug therapy: Secondary | ICD-10-CM | POA: Diagnosis not present

## 2022-01-18 DIAGNOSIS — R1084 Generalized abdominal pain: Secondary | ICD-10-CM | POA: Insufficient documentation

## 2022-01-18 DIAGNOSIS — R112 Nausea with vomiting, unspecified: Secondary | ICD-10-CM | POA: Diagnosis not present

## 2022-01-18 DIAGNOSIS — R197 Diarrhea, unspecified: Secondary | ICD-10-CM | POA: Insufficient documentation

## 2022-01-18 DIAGNOSIS — R748 Abnormal levels of other serum enzymes: Secondary | ICD-10-CM | POA: Insufficient documentation

## 2022-01-18 DIAGNOSIS — E860 Dehydration: Secondary | ICD-10-CM | POA: Diagnosis not present

## 2022-01-18 DIAGNOSIS — K852 Alcohol induced acute pancreatitis without necrosis or infection: Secondary | ICD-10-CM | POA: Diagnosis not present

## 2022-01-18 DIAGNOSIS — D751 Secondary polycythemia: Secondary | ICD-10-CM

## 2022-01-18 DIAGNOSIS — D750 Familial erythrocytosis: Secondary | ICD-10-CM | POA: Insufficient documentation

## 2022-01-18 LAB — URINALYSIS, ROUTINE W REFLEX MICROSCOPIC
Bacteria, UA: NONE SEEN
Bilirubin Urine: NEGATIVE
Glucose, UA: NEGATIVE mg/dL
Ketones, ur: NEGATIVE mg/dL
Leukocytes,Ua: NEGATIVE
Nitrite: NEGATIVE
Protein, ur: NEGATIVE mg/dL
Specific Gravity, Urine: 1.005 (ref 1.005–1.030)
pH: 6 (ref 5.0–8.0)

## 2022-01-18 LAB — CBC
HCT: 48.9 % — ABNORMAL HIGH (ref 36.0–46.0)
Hemoglobin: 17.8 g/dL — ABNORMAL HIGH (ref 12.0–15.0)
MCH: 29.3 pg (ref 26.0–34.0)
MCHC: 36.4 g/dL — ABNORMAL HIGH (ref 30.0–36.0)
MCV: 80.6 fL (ref 80.0–100.0)
Platelets: 315 10*3/uL (ref 150–400)
RBC: 6.07 MIL/uL — ABNORMAL HIGH (ref 3.87–5.11)
RDW: 15.4 % (ref 11.5–15.5)
WBC: 7.7 10*3/uL (ref 4.0–10.5)
nRBC: 0 % (ref 0.0–0.2)

## 2022-01-18 LAB — COMPREHENSIVE METABOLIC PANEL
ALT: 20 U/L (ref 0–44)
AST: 26 U/L (ref 15–41)
Albumin: 4.1 g/dL (ref 3.5–5.0)
Alkaline Phosphatase: 228 U/L — ABNORMAL HIGH (ref 38–126)
Anion gap: 13 (ref 5–15)
BUN: 20 mg/dL (ref 6–20)
CO2: 37 mmol/L — ABNORMAL HIGH (ref 22–32)
Calcium: 9.6 mg/dL (ref 8.9–10.3)
Chloride: 87 mmol/L — ABNORMAL LOW (ref 98–111)
Creatinine, Ser: 1.05 mg/dL — ABNORMAL HIGH (ref 0.44–1.00)
GFR, Estimated: >60 mL/min (ref >60)
Glucose, Bld: 98 mg/dL (ref 70–99)
Potassium: 2.9 mmol/L — ABNORMAL LOW (ref 3.5–5.1)
Sodium: 137 mmol/L (ref 135–145)
Total Bilirubin: 2.8 mg/dL — ABNORMAL HIGH (ref 0.3–1.2)
Total Protein: 8.2 g/dL — ABNORMAL HIGH (ref 6.5–8.1)

## 2022-01-18 LAB — MAGNESIUM: Magnesium: 2.5 mg/dL — ABNORMAL HIGH (ref 1.7–2.4)

## 2022-01-18 LAB — LIPASE, BLOOD: Lipase: 21 U/L (ref 11–51)

## 2022-01-18 MED ORDER — LACTATED RINGERS IV BOLUS
1000.0000 mL | Freq: Once | INTRAVENOUS | Status: AC
  Administered 2022-01-18: 1000 mL via INTRAVENOUS

## 2022-01-18 MED ORDER — MORPHINE SULFATE (PF) 4 MG/ML IV SOLN
8.0000 mg | Freq: Once | INTRAVENOUS | Status: AC
  Administered 2022-01-18: 8 mg via INTRAVENOUS
  Filled 2022-01-18 (×2): qty 2

## 2022-01-18 MED ORDER — ONDANSETRON 4 MG PO TBDP
4.0000 mg | ORAL_TABLET | Freq: Once | ORAL | Status: AC
  Administered 2022-01-18: 4 mg via ORAL
  Filled 2022-01-18: qty 1

## 2022-01-18 MED ORDER — POTASSIUM CHLORIDE CRYS ER 20 MEQ PO TBCR
40.0000 meq | EXTENDED_RELEASE_TABLET | Freq: Once | ORAL | Status: AC
  Administered 2022-01-18: 40 meq via ORAL
  Filled 2022-01-18: qty 2

## 2022-01-18 MED ORDER — HALOPERIDOL LACTATE 5 MG/ML IJ SOLN: 5.0000 mg | Freq: Once | INTRAMUSCULAR | Status: DC

## 2022-01-18 MED ORDER — ONDANSETRON 8 MG PO TBDP: 8.0000 mg | ORAL_TABLET | Freq: Three times a day (TID) | ORAL | 0 refills | Status: DC | PRN

## 2022-01-18 MED ORDER — OXYCODONE-ACETAMINOPHEN 5-325 MG PO TABS: 1.0000 | ORAL_TABLET | Freq: Four times a day (QID) | ORAL | 0 refills | Status: DC | PRN

## 2022-01-18 MED ORDER — SODIUM CHLORIDE 0.9 % IV BOLUS
1000.0000 mL | Freq: Once | INTRAVENOUS | Status: AC
  Administered 2022-01-18: 1000 mL via INTRAVENOUS

## 2022-01-18 MED ORDER — ONDANSETRON HCL 4 MG/2ML IJ SOLN
4.0000 mg | Freq: Once | INTRAMUSCULAR | Status: AC
  Administered 2022-01-18: 4 mg via INTRAVENOUS
  Filled 2022-01-18: qty 2

## 2022-01-18 MED ORDER — POTASSIUM CHLORIDE 10 MEQ/100ML IV SOLN
10.0000 meq | Freq: Once | INTRAVENOUS | Status: AC
  Administered 2022-01-18: 10 meq via INTRAVENOUS
  Filled 2022-01-18: qty 100

## 2022-01-18 NOTE — ED Notes (Signed)
Pt ambulated to and from bathroom without diff.  ?

## 2022-01-18 NOTE — ED Triage Notes (Signed)
Patient c/o diarrhea and vomiting x 3 days. Patient also reports mid back pain that radiates into the abdomen since yesterday. Patient states she has a history of pancreatitis. ?

## 2022-01-18 NOTE — Discharge Instructions (Addendum)
You are seen in the ER for abdominal pain, nausea, vomiting and diarrhea.  Please take the medications that are prescribed for symptom management.  We suspect that your pain is primarily because of pancreatitis. ? ?The work-up in the ER is overall reassuring.  We do see signs of dehydration. ? ?You will need to follow-up with your PCP in 1 week to ensure that your hemoglobin has normalized.  Hemoglobin can be elevated because of dehydration. ? ?Please return to the ER if your symptoms worsen; you have increased pain, fevers, chills, inability to keep any medications down, confusion. Otherwise see the outpatient doctor as requested. ? ?

## 2022-01-18 NOTE — ED Notes (Signed)
Pt given a cup of ginger ale at this time r/t PO challenge ?

## 2022-01-18 NOTE — ED Provider Notes (Signed)
?[facility] DEPT ?Provider Note ? ? ?CSN: [id number] ?Arrival date & time: 01/18/22  0805 ? ?  ? ?History ? ?Chief Complaint  ?Patient presents with  ? Abdominal Pain  ? Emesis  ? Diarrhea  ? ? ?Robin Fischer is a 52 y.o. female. ? ?HPI ? ?  ? ?51 year old patient comes in with chief complaint of abdominal pain, vomiting and diarrhea. ? ?Patient has history of pancreatitis, diverticulitis, UTI, alcohol abuse. ? ?She indicates that this mother states she did have some alcohol.  She started having nausea, vomiting, diarrhea thereafter along with some abdominal pain.  Her abdominal pain is in the upper quadrants and radiates to the back.  This pain started yesterday and is similar to her prior pancreatitis pain.  She has not had any complications from pancreatitis such as pseudocyst or abscess.  She indicates that she has.  Back her alcohol consumption in general.  She has fatty liver, but denies any history of liver cirrhosis or liver disease beyond that. ? ?Patient indicates that she had about 3-5 episodes of emesis and diarrhea.  Both were nonbloody and there was no bile in her vomit. ? ?Patient also indicates that she recently had a urologic procedure because of some " mass" seen in her bladder.  She has had some urinary incontinence since then.  She has been placed on antibiotics for the last month. ? ?Home Medications ?Prior to Admission medications   ?Medication Sig Start Date End Date Taking? Authorizing Provider  ?ondansetron (ZOFRAN-ODT) 8 MG disintegrating tablet Take 1 tablet (8 mg total) by mouth every 8 (eight) hours as needed for nausea. 01/18/22  Yes Varney Biles, MD  ?oxyCODONE-acetaminophen (PERCOCET/ROXICET) 5-325 MG tablet Take 1 tablet by mouth every 6 (six) hours as needed for severe pain. 01/18/22  Yes Varney Biles, MD  ?albuterol (VENTOLIN HFA) 108 (90 Base) MCG/ACT inhaler Inhale 2 puffs into the lungs every 6 (six) hours as needed for wheezing or shortness of  breath. 11/19/21   Charlott Rakes, MD  ?amLODipine (NORVASC) 10 MG tablet TAKE 1 (ONE) TABLET (10 MG TOTAL) BY MOUTH DAILY. 12/22/21   [name], NP  ?Blood Pressure Monitor KIT 1 Bag by Does not apply route 3 (three) times daily as needed. 09/26/19   [name], NP  ?budesonide (PULMICORT) 180 MCG/ACT inhaler Inhale 2 puffs into the lungs 2 (two) times daily. 11/11/21   [name], NP  ?cetirizine (ZYRTEC) 10 MG tablet Take 1 tablet (10 mg total) by mouth daily. 11/11/21   [name], NP  ?famotidine (PEPCID) 20 MG tablet Take 1 tablet (20 mg total) by mouth daily. 09/28/21   [name], NP  ?fluconazole (DIFLUCAN) 150 MG tablet Take 1 tablet (150 mg total) by mouth daily. 11/11/21   [name], NP  ?fluticasone (FLONASE) 50 MCG/ACT nasal spray Place 1 spray into both nostrils daily. 11/11/21   [name], NP  ?gabapentin (NEURONTIN) 300 MG capsule Take 1 capsule (300 mg total) by mouth 2 (two) times daily. 11/11/21   [name], NP  ?lidocaine (LIDODERM) 5 % Place 1 patch onto the skin daily. Remove & Discard patch within 12 hours or as directed by MD 12/22/21   [name], NP  ?Olopatadine HCl 0.2 % SOLN Place 1 drop into both eyes every morning.  04/03/20   [provider]  ?RESTASIS 0.05 % ophthalmic emulsion Place 1 drop into both eyes 2 (two) times daily. 12/22/21  [name], NP  ?valsartan-hydrochlorothiazide (DIOVAN-HCT) 160-25 MG tablet Take 1 tablet by mouth daily. 12/22/21   [name], NP  ?   ? ?Allergies    ?Patient has no known allergies.   ? ?Review of Systems   ?Review of Systems  ?All other systems reviewed and are negative. ? ?Physical Exam ?Updated Vital Signs ?BP 119/81   Pulse (!) 57   Temp 98.7 ?F (37.1 ?C) (Oral)   Resp 14   Ht _0  (1.676 m)   Wt 83.9 kg   SpO2 99%   BMI 29.86 kg/m?  ?Physical Exam ?Vitals and nursing note reviewed.  ?Constitutional:   ?   Appearance: She is well-developed.   ?HENT:  ?   Head: Atraumatic.  ?Cardiovascular:  ?   Rate and Rhythm: Normal rate.  ?Pulmonary:  ?   Effort: Pulmonary effort is normal.  ?Abdominal:  ?   Tenderness: There is abdominal tenderness in the right upper quadrant, epigastric area and left upper quadrant. There is no guarding or rebound.  ?Musculoskeletal:  ?   Cervical back: Normal range of motion and neck supple.  ?Skin: ?   General: Skin is warm and dry.  ?Neurological:  ?   Mental Status: She is alert and oriented to person, place, and time.  ? ? ?ED Results / Procedures / Treatments   ?Labs ?(all labs ordered are listed, but only abnormal results are displayed) ?Labs Reviewed  ?COMPREHENSIVE METABOLIC PANEL - Abnormal; Notable for the following components:  ?    Result Value  ? Potassium 2.9 (*)   ? Chloride 87 (*)   ? CO2 37 (*)   ? Creatinine, Ser 1.05 (*)   ? Total Protein 8.2 (*)   ? Alkaline Phosphatase 228 (*)   ? Total Bilirubin 2.8 (*)   ? All other components within normal limits  ?CBC - Abnormal; Notable for the following components:  ? RBC 6.07 (*)   ? Hemoglobin 17.8 (*)   ? HCT 48.9 (*)   ? MCHC 36.4 (*)   ? All other components within normal limits  ?URINALYSIS, ROUTINE W REFLEX MICROSCOPIC - Abnormal; Notable for the following components:  ? Hgb urine dipstick SMALL (*)   ? All other components within normal limits  ?MAGNESIUM - Abnormal; Notable for the following components:  ? Magnesium 2.5 (*)   ? All other components within normal limits  ?LIPASE, BLOOD  ? ? ?EKG ?None ? ?Radiology ?No results found. ? ?Procedures ?Procedures  ? ? ?Medications Ordered in ED ?Medications  ?lactated ringers bolus 1,000 mL (0 mLs Intravenous Stopped 01/18/22 1155)  ?ondansetron (ZOFRAN) injection 4 mg (4 mg Intravenous Given 01/18/22 1019)  ?morphine (PF) 4 MG/ML injection 8 mg (8 mg Intravenous Given 01/18/22 1020)  ?potassium chloride 10 mEq in 100 mL IVPB (0 mEq Intravenous Stopped 01/18/22 1224)  ?potassium chloride SA (KLOR-CON M) CR tablet 40 mEq  (40 mEq Oral Given 01/18/22 1019)  ?sodium chloride 0.9 % bolus 1,000 mL (1,000 mLs Intravenous New Bag/Given 01/18/22 1223)  ?ondansetron (ZOFRAN-ODT) disintegrating tablet 4 mg (4 mg Oral Given 01/18/22 1249)  ? ? ?ED Course/ Medical Decision Making/ A&P ?Clinical Course as of 01/18/22 1531  ?Tue Jan 18, 2022  ?1007 Alkaline Phosphatase(!): 228 ?Reviewed patient's previous labs.  Patient's LFTs including alk phos were actually higher 9 months ago.  Her total bili however is higher today. [AN]  ?1008 Total Bilirubin(!): 2.8 ?Total bili could be high because of dehydration. [AN]  ?  1526 Hemoglobin(!): 17.8 ?Likely because of dehydration.  Patient made aware that she will need a recheck. [AN]  ?  ?Clinical Course User Index ?[AN] Varney Biles, MD  ? ?                        ?Medical Decision Making ?Amount and/or Complexity of Data Reviewed ?Labs: ordered. Decision-making details documented in ED Course. ? ?Risk ?Prescription drug management. ? ? ?This patient presents to the ED with chief complaint(s) of  with pertinent past medical history of pancreatitis which further complicates the presenting complaint. The complaint involves an extensive differential diagnosis and also carries with it a high risk of complications and morbidity.   ? ?The differential diagnosis includes : ?Pancreatitis, small bowel obstruction, ileus, intra-abdominal infection, severe metabolic derangement because of nausea, vomiting, complication from a colitis such as pseudocyst. ? ?On exam, patient is nontoxic.  She does appear slightly dry.  She has generalized upper quadrant abdominal discomfort without any peritonitis ? ?The initial plan is to order basic labs only and get something for pain and nausea.  We will reassess the patient. ? ? ?Additional history obtained: ?Records reviewed  urology visit recently for her diverticulum, also reviewed CT renal stone study and CT abdomen pelvis from 04-2019.  The latter did show acute pancreatitis.   The former shows urethral diverticulum. ? ?Independent labs interpretation:  ?The following labs were independently interpreted: See work-up tab ? ?Independent visualization of imaging: ?-Imaging was consider

## 2022-02-03 ENCOUNTER — Other Ambulatory Visit (INDEPENDENT_AMBULATORY_CARE_PROVIDER_SITE_OTHER): Payer: Self-pay | Admitting: Primary Care

## 2022-02-03 DIAGNOSIS — T7840XD Allergy, unspecified, subsequent encounter: Secondary | ICD-10-CM

## 2022-02-03 NOTE — Telephone Encounter (Signed)
Request routed to PCP ?

## 2022-02-07 ENCOUNTER — Other Ambulatory Visit (INDEPENDENT_AMBULATORY_CARE_PROVIDER_SITE_OTHER): Payer: Self-pay | Admitting: Primary Care

## 2022-02-07 NOTE — Telephone Encounter (Signed)
Medication Refill - Medication: fluticasone (FLONASE) 50 MCG/ACT nasal spray [id number]   Has the patient contacted their pharmacy? Yes.    Preferred Pharmacy (with phone number or street name):  [facility], [place] - [address]  [place] [address]  Phone: [phone] Fax: [phone]  Hours: Not open 24 hours     Has the patient been seen for an appointment in the last year OR does the patient have an upcoming appointment? Yes.    Agent: Please be advised that RX refills may take up to 3 business days. We ask that you follow-up with your pharmacy.

## 2022-02-08 MED ORDER — FLUTICASONE PROPIONATE 50 MCG/ACT NA SUSP: 1.0000 | Freq: Every day | NASAL | 0 refills | Status: DC

## 2022-02-08 NOTE — Telephone Encounter (Signed)
Requested Prescriptions  Pending Prescriptions Disp Refills  . fluticasone (FLONASE) 50 MCG/ACT nasal spray 16 g 0    Sig: Place 1 spray into both nostrils daily.     Ear, Nose, and Throat: Nasal Preparations - Corticosteroids Passed - 02/07/2022  2:49 PM      Passed - Valid encounter within last 12 months    Recent Outpatient Visits          1 month ago Allergy, subsequent encounter   [facility] Grayce Sessions, NP   2 months ago Primary hypertension   [facility] Grayce Sessions, NP   4 months ago Screening for diabetes mellitus   [facility] Grayce Sessions, NP   2 years ago Essential hypertension   [facility] Grayce Sessions, NP   2 years ago Hypertension, unspecified type   [facility] Grayce Sessions, NP      Future Appointments            In 1 week Randa Evens Kinnie Scales, NP [facility]

## 2022-02-21 ENCOUNTER — Ambulatory Visit (INDEPENDENT_AMBULATORY_CARE_PROVIDER_SITE_OTHER): Payer: Medicaid Other | Admitting: Primary Care

## 2022-02-22 ENCOUNTER — Encounter: Payer: Self-pay | Admitting: Neurology

## 2022-02-22 ENCOUNTER — Ambulatory Visit: Payer: Medicaid Other | Admitting: Neurology

## 2022-02-22 VITALS — BP 103/69 | HR 67 | Ht 67.0 in | Wt 191.8 lb

## 2022-02-22 DIAGNOSIS — G4719 Other hypersomnia: Secondary | ICD-10-CM

## 2022-02-22 DIAGNOSIS — R0683 Snoring: Secondary | ICD-10-CM

## 2022-02-22 DIAGNOSIS — G43009 Migraine without aura, not intractable, without status migrainosus: Secondary | ICD-10-CM | POA: Insufficient documentation

## 2022-02-22 DIAGNOSIS — T7840XA Allergy, unspecified, initial encounter: Secondary | ICD-10-CM

## 2022-02-22 DIAGNOSIS — R5383 Other fatigue: Secondary | ICD-10-CM

## 2022-02-22 DIAGNOSIS — R51 Headache with orthostatic component, not elsewhere classified: Secondary | ICD-10-CM

## 2022-02-22 DIAGNOSIS — R0981 Nasal congestion: Secondary | ICD-10-CM

## 2022-02-22 DIAGNOSIS — H539 Unspecified visual disturbance: Secondary | ICD-10-CM

## 2022-02-22 DIAGNOSIS — R519 Headache, unspecified: Secondary | ICD-10-CM

## 2022-02-22 DIAGNOSIS — G8929 Other chronic pain: Secondary | ICD-10-CM

## 2022-02-22 MED ORDER — QULIPTA 60 MG PO TABS: 60.0000 mg | ORAL_TABLET | Freq: Every day | ORAL | 0 refills | Status: DC

## 2022-02-22 MED ORDER — QULIPTA 60 MG PO TABS: 60.0000 mg | ORAL_TABLET | Freq: Every day | ORAL | 11 refills | Status: DC

## 2022-02-22 NOTE — Patient Instructions (Addendum)
- Patient with morning headaches, daytime naps, snoring, waking in the middle of the night - could be sleep apnea, will send you to a sleep doctor  - Also with lots of congestion and mucous - could be allergies and congestion especially in the morning can refer to an allergist(tried flonase, decongestants, antibiotics) - Other consideration includes migraines, will treat for migraines - MRI brain due to concerning symptoms of morning headaches, positional and exertional headaches,vision changes, worsening headaches  to look for space occupying mass, chiari or intracranial hypertension (pseudotumor), strokes, malignancies, vasculidities, demyelination(multiple sclerosis) or other    Migraine Headache A migraine headache is an intense, throbbing pain on one side or both sides of the head. Migraine headaches may also cause other symptoms, such as nausea, vomiting, and sensitivity to light and noise. A migraine headache can last from 4 hours to 3 days. Talk with your doctor about what things may bring on (trigger) your migraine headaches. What are the causes? The exact cause of this condition is not known. However, a migraine may be caused when nerves in the brain become irritated and release chemicals that cause inflammation of blood vessels. This inflammation causes pain. This condition may be triggered or caused by: Drinking alcohol. Smoking. Taking medicines, such as: Medicine used to treat chest pain (nitroglycerin). Birth control pills. Estrogen. Certain blood pressure medicines. Eating or drinking products that contain nitrates, glutamate, aspartame, or tyramine. Aged cheeses, chocolate, or caffeine may also be triggers. Doing physical activity. Other things that may trigger a migraine headache include: Menstruation. Pregnancy. Hunger. Stress. Lack of sleep or too much sleep. Weather changes. Fatigue. What increases the risk? The following factors may make you more likely to  experience migraine headaches: Being a certain age. This condition is more common in people who are 90-7 years old. Being female. Having a family history of migraine headaches. Being Caucasian. Having a mental health condition, such as depression or anxiety. Being obese. What are the signs or symptoms? The main symptom of this condition is pulsating or throbbing pain. This pain may: Happen in any area of the head, such as on one side or both sides. Interfere with daily activities. Get worse with physical activity. Get worse with exposure to bright lights or loud noises. Other symptoms may include: Nausea. Vomiting. Dizziness. General sensitivity to bright lights, loud noises, or smells. Before you get a migraine headache, you may get warning signs (an aura). An aura may include: Seeing flashing lights or having blind spots.  Sleep Apnea Sleep apnea is a condition in which breathing pauses or becomes shallow during sleep. People with sleep apnea usually snore loudly. They may have times when they gasp and stop breathing for 10 seconds or more during sleep. This may happen many times during the night. Sleep apnea disrupts your sleep and keeps your body from getting the rest that it needs. This condition can increase your risk of certain health problems, including: Heart attack. Stroke. Obesity. Type 2 diabetes. Heart failure. Irregular heartbeat. High blood pressure. The goal of treatment is to help you breathe normally again. What are the causes?  The most common cause of sleep apnea is a collapsed or blocked airway. There are three kinds of sleep apnea: Obstructive sleep apnea. This kind is caused by a blocked or collapsed airway. Central sleep apnea. This kind happens when the part of the brain that controls breathing does not send the correct signals to the muscles that control breathing. Mixed sleep apnea. This is a  combination of obstructive and central sleep apnea. What  increases the risk? You are more likely to develop this condition if you: Are overweight. Smoke. Have a smaller than normal airway. Are older. Are female. Drink alcohol. Take sedatives or tranquilizers. Have a family history of sleep apnea. Have a tongue or tonsils that are larger than normal. What are the signs or symptoms? Symptoms of this condition include: Trouble staying asleep. Loud snoring. Morning headaches. Waking up gasping. Dry mouth or sore throat in the morning. Daytime sleepiness and tiredness. If you have daytime fatigue because of sleep apnea, you may be more likely to have: Trouble concentrating. Forgetfulness. Irritability or mood swings. Personality changes. Feelings of depression. Sexual dysfunction. This may include loss of interest if you are female, or erectile dysfunction if you are female. How is this diagnosed? This condition may be diagnosed with: A medical history. A physical exam. A series of tests that are done while you are sleeping (sleep study). These tests are usually done in a sleep lab, but they may also be done at home. How is this treated? Treatment for this condition aims to restore normal breathing and to ease symptoms during sleep. It may involve managing health issues that can affect breathing, such as high blood pressure or obesity. Treatment may include: Sleeping on your side. Using a decongestant if you have nasal congestion. Avoiding the use of depressants, including alcohol, sedatives, and narcotics. Losing weight if you are overweight. Making changes to your diet. Quitting smoking. Using a device to open your airway while you sleep, such as: An oral appliance. This is a custom-made mouthpiece that shifts your lower jaw forward. A continuous positive airway pressure (CPAP) device. This device blows air through a mask when you breathe out (exhale). A nasal expiratory positive airway pressure (EPAP) device. This device has valves that  you put into each nostril. A bi-level positive airway pressure (BIPAP) device. This device blows air through a mask when you breathe in (inhale) and breathe out (exhale). Having surgery if other treatments do not work. During surgery, excess tissue is removed to create a wider airway. Follow these instructions at home: Lifestyle Make any lifestyle changes that your health care provider recommends. Eat a healthy, well-balanced diet. Take steps to lose weight if you are overweight. Avoid using depressants, including alcohol, sedatives, and narcotics. Do not use any products that contain nicotine or tobacco. These products include cigarettes, chewing tobacco, and vaping devices, such as e-cigarettes. If you need help quitting, ask your health care provider. General instructions Take over-the-counter and prescription medicines only as told by your health care provider. If you were given a device to open your airway while you sleep, use it only as told by your health care provider. If you are having surgery, make sure to tell your health care provider you have sleep apnea. You may need to bring your device with you. Keep all follow-up visits. This is important. Contact a health care provider if: The device that you received to open your airway during sleep is uncomfortable or does not seem to be working. Your symptoms do not improve. Your symptoms get worse. Get help right away if: You develop: Chest pain. Shortness of breath. Discomfort in your back, arms, or stomach. You have: Trouble speaking. Weakness on one side of your body. Drooping in your face. These symptoms may represent a serious problem that is an emergency. Do not wait to see if the symptoms will go away. Get medical help right  away. Call your local emergency services (911 in the [place] Do not drive yourself to the hospital. Summary Sleep apnea is a condition in which breathing pauses or becomes shallow during sleep. The most  common cause is a collapsed or blocked airway. The goal of treatment is to restore normal breathing and to ease symptoms during sleep. This information is not intended to replace advice given to you by your health care provider. Make sure you discuss any questions you have with your health care provider. Document Revised: 03/31/2021 Document Reviewed: 07/31/2020 Elsevier Patient Education  2023 [employer] Atogepant Tablets What is this medication? ATOGEPANT (a TOE je pant) prevents migraines. It works by blocking a substance in the body that causes migraines. This medicine may be used for other purposes; ask your health care provider or pharmacist if you have questions. COMMON BRAND NAME(S): QULIPTA What should I tell my care team before I take this medication? They need to know if you have any of these conditions: Kidney disease Liver disease An unusual or allergic reaction to atogepant, other medications, foods, dyes, or preservatives Pregnant or trying to get pregnant Breast-feeding How should I use this medication? Take this medication by mouth with water. Take it as directed on the prescription label at the same time every day. You can take it with or without food. If it upsets your stomach, take it with food. Keep taking it unless your care team tells you to stop. Talk to your care team about the use of this medication in children. Special care may be needed. Overdosage: If you think you have taken too much of this medicine contact a poison control center or emergency room at once. NOTE: This medicine is only for you. Do not share this medicine with others. What if I miss a dose? If you miss a dose, take it as soon as you can. If it is almost time for your next dose, take only that dose. Do not take double or extra doses. What may interact with this medication? Carbamazepine Certain medications for fungal infections, such as itraconazole,  ketoconazole Clarithromycin Cyclosporine Efavirenz Etravirine Phenytoin Rifampin St. John's wort This list may not describe all possible interactions. Give your health care provider a list of all the medicines, herbs, non-prescription drugs, or dietary supplements you use. Also tell them if you smoke, drink alcohol, or use illegal drugs. Some items may interact with your medicine. What should I watch for while using this medication? Visit your care team for regular checks on your progress. Tell your care team if your symptoms do not start to get better or if they get worse. What side effects may I notice from receiving this medication? Side effects that you should report to your care team as soon as possible: Allergic reactions--skin rash, itching, hives, swelling of the face, lips, tongue, or throat Side effects that usually do not require medical attention (report to your care team if they continue or are bothersome): Constipation Fatigue Loss of appetite with weight loss Nausea This list may not describe all possible side effects. Call your doctor for medical advice about side effects. You may report side effects to FDA at 1-800-FDA-1088. Where should I keep my medication? Keep out of the reach of children and pets. Store at room temperature between 20 and 25 degrees C (68 and 77 degrees F). Get rid of any unused medication after the expiration date. To get rid of medications that are no longer needed or have expired: Take the medication  to a medication take-back program. Check with your pharmacy or law enforcement to find a location. If you cannot return the medication, check the label or package insert to see if the medication should be thrown out in the garbage or flushed down the toilet. If you are not sure, ask your care team. If it is safe to put it in the trash, take the medication out of the container. Mix the medication with cat litter, dirt, coffee grounds, or other unwanted  substance. Seal the mixture in a bag or container. Put it in the trash. NOTE: This sheet is a summary. It may not cover all possible information. If you have questions about this medicine, talk to your doctor, pharmacist, or health care provider.  2023 Elsevier/Gold Standard (2021-10-11 00:00:00)  Seeing bright spots, halos, or zigzag lines. Having tunnel vision or blurred vision. Having numbness or a tingling feeling. Having trouble talking. Having muscle weakness. Some people have symptoms after a migraine headache (postdromal phase), such as: Feeling tired. Difficulty concentrating. How is this diagnosed? A migraine headache can be diagnosed based on: Your symptoms. A physical exam. Tests, such as: CT scan or an MRI of the head. These imaging tests can help rule out other causes of headaches. Taking fluid from the spine (lumbar puncture) and analyzing it (cerebrospinal fluid analysis, or CSF analysis). How is this treated? This condition may be treated with medicines that: Relieve pain. Relieve nausea. Prevent migraine headaches. Treatment for this condition may also include: Acupuncture. Lifestyle changes like avoiding foods that trigger migraine headaches. Biofeedback. Cognitive behavioral therapy. Follow these instructions at home: Medicines Take over-the-counter and prescription medicines only as told by your health care provider. Ask your health care provider if the medicine prescribed to you: Requires you to avoid driving or using heavy machinery. Can cause constipation. You may need to take these actions to prevent or treat constipation: Drink enough fluid to keep your urine pale yellow. Take over-the-counter or prescription medicines. Eat foods that are high in fiber, such as beans, whole grains, and fresh fruits and vegetables. Limit foods that are high in fat and processed sugars, such as fried or sweet foods. Lifestyle Do not drink alcohol. Do not use any  products that contain nicotine or tobacco, such as cigarettes, e-cigarettes, and chewing tobacco. If you need help quitting, ask your health care provider. Get at least 8 hours of sleep every night. Find ways to manage stress, such as meditation, deep breathing, or yoga. General instructions     Keep a journal to find out what may trigger your migraine headaches. For example, write down: What you eat and drink. How much sleep you get. Any change to your diet or medicines. If you have a migraine headache: Avoid things that make your symptoms worse, such as bright lights. It may help to lie down in a dark, quiet room. Do not drive or use heavy machinery. Ask your health care provider what activities are safe for you while you are experiencing symptoms. Keep all follow-up visits as told by your health care provider. This is important. Contact a health care provider if: You develop symptoms that are different or more severe than your usual migraine headache symptoms. You have more than 15 headache days in one month. Get help right away if: Your migraine headache becomes severe. Your migraine headache lasts longer than 72 hours. You have a fever. You have a stiff neck. You have vision loss. Your muscles feel weak or like you cannot control  them. You start to lose your balance often. You have trouble walking. You faint. You have a seizure. Summary A migraine headache is an intense, throbbing pain on one side or both sides of the head. Migraines may also cause other symptoms, such as nausea, vomiting, and sensitivity to light and noise. This condition may be treated with medicines and lifestyle changes. You may also need to avoid certain things that trigger a migraine headache. Keep a journal to find out what may trigger your migraine headaches. Contact your health care provider if you have more than 15 headache days in a month or you develop symptoms that are different or more severe  than your usual migraine headache symptoms. This information is not intended to replace advice given to you by your health care provider. Make sure you discuss any questions you have with your health care provider. Document Revised: 12/14/2018 Document Reviewed: 10/04/2018 Elsevier Patient Education  2023 [employer].

## 2022-02-22 NOTE — Progress Notes (Signed)
[facility]    Provider:  Dr Jaynee Eagles Requesting Provider: Marin Comment, My [place], [place] Primary Care Provider:  [name], NP  CC:  headaches  HPI:  Robin Fischer is a 51 y.o. female here as requested by Marin Comment, My [place], [place] for headaches.  Past medical history of migraines, UTI, dehydration, pancreatitis, tobacco and alcohol abuse, asthma, hypertension, abnormal LFTs, alcoholic pancreatitis.  I reviewed Dr. Gus Puma notes eye doctor: Extraocular muscles were full, pupils equally round and reactive, negative APD, confrontation fields were full, slit-lamp examination was unremarkable, open angles in both eyes, intraocular pressures were 19 mg mercury in each eye, dilated funduscopic exam revealed healthy pink indistinct disc with cup-to-disc ratio of 0.2/0.2 OD and OS, lens moderate cataracts in both eyes, posterior pole is mild hypertensive retinopathy bilateral, no papilledema noted, peripheral retina did not show any signs of retinal holes, tears, or breaks or detachment in both eyes.  Patient is here and she reports she has had headaches for years and she also has a lot of sinus issues but antibiotics did not helps. Headaches started 2 years ago with glasses, just got new glasses last week and sent here. Headaches are in the back of the eyeballs, both side and in the middle of the face. Pulsating/pounding/throbbing. She is having blurry vision, she wakes up with headaches, she snores a lot, she is exhausted, she woke up with a headache this morning, light bother her, she used to wear shades all the time, nausea, hurts to move, can be worse in the morning positionally. She has to take a nap every day. Worsening headaches. She has 12 migraine days a month. She has a lot of cogestion, she went to an allergist years ago, headaches can last up to 12 hours, she does not treat, laying in the dark helps, stress can trigger and poor sleep. No other focal neurologic deficits, associated symptoms,  inciting events or modifiable factors.  Reviewed notes, labs and imaging from outside physicians, which showed BUN 20, creat 1.05  Review of Systems: Patient complains of symptoms per HPI as well as the following symptoms headaches. Pertinent negatives and positives per HPI. All others negative.   Social History   Socioeconomic History   Marital status: Single    Spouse name: Not on file   Number of children: Not on file   Years of education: Not on file   Highest education level: Not on file  Occupational History   Not on file  Tobacco Use   Smoking status: Every Day    Packs/day: 0.50    Types: Cigarettes   Smokeless tobacco: Never  Vaping Use   Vaping Use: Never used  Substance and Sexual Activity   Alcohol use: Yes    Alcohol/week: 6.0 standard drinks of alcohol    Types: 2 Cans of beer, 4 Shots of liquor per week    Comment: beer   Drug use: No   Sexual activity: Not on file  Other Topics Concern   Not on file  Social History Narrative   Not on file   Social Determinants of Health   Financial Resource Strain: Not on file  Food Insecurity: Not on file  Transportation Needs: Not on file  Physical Activity: Not on file  Stress: Not on file  Social Connections: Not on file  Intimate Partner Violence: Not on file    Family History  Problem Relation Age of Onset   Hypertension Mother    Hyperlipidemia Mother    Hypertension  Father    Hyperlipidemia Father    Migraines Neg Hx    Headache Neg Hx     Past Medical History:  Diagnosis Date   Asthma    Bronchitis    GERD (gastroesophageal reflux disease)    High cholesterol    Hypertension    Pancreatitis    Tobacco abuse     Patient Active Problem List   Diagnosis Date Noted   Migraine without aura and without status migrainosus, not intractable 02/22/2022   Gross hematuria 09/24/2021   Tonsillopharyngitis 07/28/2020   Acute lower UTI 07/28/2020   Dehydration 05/04/2019   Pancreatitis 05/04/2019    Hypokalemia 08/20/2018   Tobacco abuse 08/20/2018   Leukocytosis 08/20/2018   Alcohol abuse 02/58/5277   Alcoholic pancreatitis 82/42/3536   Abnormal LFTs 08/20/2018   Acute pancreatitis 08/20/2018   Asthma    Hypertension     Past Surgical History:  Procedure Laterality Date   TRANSURETHRAL RESECTION OF BLADDER TUMOR WITH MITOMYCIN-C N/A 09/24/2021   Procedure: CYSTOSCOPY; CLOT EVACUATION; [place];  Surgeon: [name], MD;  Location: [facility];  Service: Urology;  Laterality: N/A;    Current Outpatient Medications  Medication Sig Dispense Refill   albuterol (VENTOLIN HFA) 108 (90 Base) MCG/ACT inhaler Inhale 2 puffs into the lungs every 6 (six) hours as needed for wheezing or shortness of breath. 18 g 2   amLODipine (NORVASC) 10 MG tablet TAKE 1 (ONE) TABLET (10 MG TOTAL) BY MOUTH DAILY. 90 tablet 1   Atogepant (QULIPTA) 60 MG TABS Take 60 mg by mouth daily. 30 tablet 11   Blood Pressure Monitor KIT 1 Bag by Does not apply route 3 (three) times daily as needed. 1 kit 0   budesonide (PULMICORT) 180 MCG/ACT inhaler Inhale 2 puffs into the lungs 2 (two) times daily. 1 each 5   cetirizine (ZYRTEC) 10 MG tablet Take 1 tablet (10 mg total) by mouth daily. 90 tablet 1   famotidine (PEPCID) 20 MG tablet Take 1 tablet (20 mg total) by mouth daily. 90 tablet 1   fluconazole (DIFLUCAN) 150 MG tablet Take 1 tablet (150 mg total) by mouth daily. 1 tablet 1   fluticasone (FLONASE) 50 MCG/ACT nasal spray Place 1 spray into both nostrils daily. 16 g 0   gabapentin (NEURONTIN) 300 MG capsule Take 1 capsule (300 mg total) by mouth 2 (two) times daily. 180 capsule 1   lidocaine (LIDODERM) 5 % Place 1 patch onto the skin daily. Remove & Discard patch within 12 hours or as directed by MD 30 patch 0   Olopatadine HCl 0.2 % SOLN Place 1 drop into both eyes every morning.      ondansetron (ZOFRAN-ODT) 8 MG disintegrating tablet Take 1 tablet (8 mg total) by mouth every 8 (eight) hours  as needed for nausea. 20 tablet 0   oxyCODONE-acetaminophen (PERCOCET/ROXICET) 5-325 MG tablet Take 1 tablet by mouth every 6 (six) hours as needed for severe pain. 9 tablet 0   RESTASIS 0.05 % ophthalmic emulsion PLACE 1 DROP INTO BOTH EYES 2 (TWO) TIMES DAILY. 60 each 3   valsartan-hydrochlorothiazide (DIOVAN-HCT) 160-25 MG tablet Take 1 tablet by mouth daily. 90 tablet 3   No current facility-administered medications for this visit.    Allergies as of 02/22/2022   (No Known Allergies)    Vitals: BP 103/69   Pulse 67   Ht '5\' 7"'  (1.702 m)   Wt 191 lb 12.8 oz (87 kg)   BMI 30.04 kg/m  Last  Weight:  Wt Readings from Last 1 Encounters:  02/22/22 191 lb 12.8 oz (87 kg)   Last Height:   Ht Readings from Last 1 Encounters:  02/22/22 '5\' 7"'  (1.702 m)     Physical exam: Exam: Gen: NAD, conversant, well nourised, obese, well groomed                     CV: RRR, no MRG. No Carotid Bruits. No peripheral edema, warm, nontender Eyes: Conjunctivae clear without exudates or hemorrhage  Neuro: Detailed Neurologic Exam  Speech:    Speech is normal; fluent and spontaneous with normal comprehension.  Cognition:    The patient is oriented to person, place, and time;     recent and remote memory intact;     language fluent;     normal attention, concentration,     fund of knowledge Cranial Nerves:    The pupils are equal, round, and reactive to light. The fundi are normal and spontaneous venous pulsations are present. Visual fields are full to finger confrontation. Extraocular movements are intact. Trigeminal sensation is intact and the muscles of mastication are normal. The face is symmetric. The palate elevates in the midline. Hearing intact. Voice is normal. Shoulder shrug is normal. The tongue has normal motion without fasciculations.   Coordination:    Normal   Gait:     normal.   Motor Observation:    No asymmetry, no atrophy, and no involuntary movements noted. Tone:     Normal muscle tone.    Posture:    Posture is normal. normal erect    Strength:    Strength is V/V in the upper and lower limbs.      Sensation: intact to LT     Reflex Exam:  DTR's:    Deep tendon reflexes in the upper and lower extremities are normal bilaterally.   Toes:    The toes are downgoing bilaterally.   Clonus:    Clonus is absent.    Assessment/Plan: Patient with headaches here for evaluation.  - Patient with morning headaches, daytime naps, snoring, waking in the middle of the night - could be sleep apnea - ESS 20, sleep referral evaluation -Also with lots of congestion and mucous - could be allergies and congestion especially in the morning can refer to an allergist(tried flonase, decongestants, antibiotics) - Other consideration includes migraines, will treat for migraines with qulipta (12 migraine days a month and total of < 14 total headache days a month) failed amlodipine,metoprolol, amitriptyline, topiramate -MRI brain due to concerning symptoms of morning headaches, positionalheadaches,vision changes, worsening headaches  to look for space occupying mass, chiari or intracranial hypertension (pseudotumor), strokes, malignancies, vasculidities, demyelination(multiple sclerosis) or other   Orders Placed This Encounter  Procedures   MR BRAIN W WO CONTRAST   TSH   Ambulatory referral to Sleep Studies   Ambulatory referral to Allergy   Meds ordered this encounter  Medications   DISCONTD: Atogepant (QULIPTA) 60 MG TABS    Sig: Take 60 mg by mouth daily.    Dispense:  20 tablet    Refill:  0   Atogepant (QULIPTA) 60 MG TABS    Sig: Take 60 mg by mouth daily.    Dispense:  30 tablet    Refill:  11    Cc: Le, My [place], [place],  [name], NP  Sarina Ill, MD  [facility] [address] [place], [place] [address]  Phone [phone] Fax [phone]

## 2022-02-24 ENCOUNTER — Telehealth: Payer: Self-pay | Admitting: *Deleted

## 2022-02-24 NOTE — Telephone Encounter (Signed)
Qulipta denied. Sent to MD.

## 2022-02-24 NOTE — Telephone Encounter (Signed)
Bennie Pierini PA, Key: [id number], [id number]. Per  CMM:  trial and failure of TWO formulary preferred preventative CGRP Blockers / Modulators? Note: the current preferred agents are Aimovig and Emgality. Your information has been sent to [facility].

## 2022-02-28 ENCOUNTER — Telehealth: Payer: Self-pay | Admitting: Neurology

## 2022-03-01 NOTE — Telephone Encounter (Signed)
I spoke with the patient. She would like to take her Qulipta samples first before doing injections. She is not a fan of needles. Pt verbalized appreciation for the call and stated she would call us back to discuss the next steps once she finishes the samples.

## 2022-03-23 ENCOUNTER — Other Ambulatory Visit (INDEPENDENT_AMBULATORY_CARE_PROVIDER_SITE_OTHER): Payer: Self-pay | Admitting: Primary Care

## 2022-03-23 ENCOUNTER — Other Ambulatory Visit: Payer: Self-pay | Admitting: Family Medicine

## 2022-03-23 DIAGNOSIS — K219 Gastro-esophageal reflux disease without esophagitis: Secondary | ICD-10-CM

## 2022-04-07 ENCOUNTER — Ambulatory Visit (INDEPENDENT_AMBULATORY_CARE_PROVIDER_SITE_OTHER): Payer: Medicaid Other | Admitting: Primary Care

## 2022-04-19 ENCOUNTER — Encounter: Payer: Self-pay | Admitting: Neurology

## 2022-04-19 ENCOUNTER — Ambulatory Visit: Payer: Medicaid Other | Admitting: Neurology

## 2022-04-19 VITALS — BP 106/61 | HR 71 | Ht 67.0 in | Wt 188.0 lb

## 2022-04-19 DIAGNOSIS — J439 Emphysema, unspecified: Secondary | ICD-10-CM

## 2022-04-19 DIAGNOSIS — G4719 Other hypersomnia: Secondary | ICD-10-CM

## 2022-04-19 DIAGNOSIS — F519 Sleep disorder not due to a substance or known physiological condition, unspecified: Secondary | ICD-10-CM | POA: Diagnosis not present

## 2022-04-19 DIAGNOSIS — I700 Atherosclerosis of aorta: Secondary | ICD-10-CM | POA: Insufficient documentation

## 2022-04-19 DIAGNOSIS — J328 Other chronic sinusitis: Secondary | ICD-10-CM | POA: Diagnosis not present

## 2022-04-19 DIAGNOSIS — G43109 Migraine with aura, not intractable, without status migrainosus: Secondary | ICD-10-CM

## 2022-04-19 MED ORDER — TRAZODONE HCL 50 MG PO TABS
50.0000 mg | ORAL_TABLET | Freq: Every day | ORAL | 3 refills | Status: DC
Start: 2022-04-19 — End: 2022-10-31

## 2022-04-19 NOTE — Progress Notes (Addendum)
[facility]    Provider:  Larey Seat, MD  Primary Care Physician:  [name], NP [id number] [address]     Referring Provider: Melvenia Beam, [place],  [place] [address]          Chief Complaint according to patient   Patient presents with:     New Patient (Initial Visit)     Phlegmonous tonsillitis on the left without evidence of discrete abscess at this time. Pt referred for sleep consult. No prior SS. Pt reports loud snoring, waking up with sinus drainage making her vomit at time. Having morning HA.       HISTORY OF PRESENT ILLNESS:  Robin Fischer. Robin Fischer is a 51 y.o. year old 77 or [place] American female patient seen upon a referral on 04/19/2022 from dr Jaynee Eagles, MD , for a sleep consultation.  Chief concern according to patient :  " I wake up with headaches, but they don't wake me. I snore, I have sinus problems, collecting phlegm, and my central forehead will hurt, pressure behind my eyeballs.  Photophobia, and I smoke" . "My headaches leave me nauseated, and I may feel better after I vomit". 4 days a week I keep a headache, it gets better during the day."  Frequent sore throat -Phlegmonous tonsillitis on the left.  I have the pleasure of seeing Robin Fischer. [place] today, a left -handed African American female with a possible sleep disorder.  She  has a past medical history of Asthma, frequent Bronchitis, GERD (gastroesophageal reflux disease) with related apneas-, High cholesterol, Hypertension, Migraine, Pancreatitis ( 2022), and active Tobacco abuse.     Sleep relevant medical history: Nocturia 2 times, on diuretics, no ENT surgery, GERD started in 2020, no Tonsillectomy, never had a colonoscopy.    Family medical /sleep history: No other family member on CPAP with OSA, father used to snore,  One sister with HTN, DM  and one brother with HTN. 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 and lives in a household with daughter ( 42) the two elder daughters have left the home.  Family status is single , with 3 children, no  grandchildren.  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  Tobacco use; 1/2 ppd.   ETOH use : liquor, 3 times a week- 2 drinks at these occassions. ,  Caffeine intake in form of Coffee( /) Soda( /) Tea ( /) or energy drinks. Regular exercise in form of ;none.  Chronic pain    Sleep habits are as follows: The patient's dinner time is between 5-6 PM.  The patient goes to bed at 10 PM and continues to sleep for a total of 6-7 hours, wakes for 2 bathroom breaks, the first time at 1 AM.  She sleeps with the TV on ever since her baby daughter was born prematurely- 14 years .  The preferred sleep position is laterally,( she chokes in her sleep if she sleeps supine)  with the support of 2 pillows. Bed is flat . GERD better controlled when sleeping elevated. Dreams are reportedly frequent/vivid, unpleasant .  The patient wakes up spontaneously. 5 AM is the usual rise time. Daughter gets up at 7 AM.  She reports not feeling refreshed or restored in AM, with symptoms such as dry mouth, phlegm, bad  taste in her mouth, morning headaches, and residual fatigue.  Naps are taken infrequently, lasting from 20 to 30 minutes and are more refreshing than nocturnal sleep.    Review of Systems: Out of a complete 14 system review, the patient complains of only the following symptoms, and all other reviewed systems are negative.:  Fatigue, sleepiness , snoring, fragmented sleep, Insomnia - Nocturia.    How likely are you to doze in the following situations: 0 = not likely, 1 = slight chance, 2 = moderate chance, 3 = high chance   Sitting and Reading?1 Watching Television? 3 Sitting inactive in a public place (theater or meeting)?3 As a passenger in a car for an hour without a  break?3 Lying down in the afternoon when circumstances permit?3 Sitting and talking to someone? 1 Sitting quietly after lunch without alcohol?3 In a car, while stopped for a few minutes in traffic?3   Total =17 / 24 points   FSS endorsed at 63/ 63 points.   Social History   Socioeconomic History   Marital status: Single    Spouse name: Not on file   Number of children: Not on file   Years of education: Not on file   Highest education level: Not on file  Occupational History   Not on file  Tobacco Use   Smoking status: Every Day    Packs/day: 0.50    Types: Cigarettes   Smokeless tobacco: Never  Vaping Use   Vaping Use: Never used  Substance and Sexual Activity   Alcohol use: Yes    Alcohol/week: 6.0 standard drinks of alcohol    Types: 2 Cans of beer, 4 Shots of liquor per week    Comment: beer   Drug use: No   Sexual activity: Not on file  Other Topics Concern   Not on file  Social History Narrative   Lives with daughter   Left handed   Caffeine: zero   Social Determinants of Health   Financial Resource Strain: Not on file  Food Insecurity: Not on file  Transportation Needs: Not on file  Physical Activity: Not on file  Stress: Not on file  Social Connections: Not on file    Family History  Problem Relation Age of Onset   Hypertension Mother    Hyperlipidemia Mother    Hypertension Father    Hyperlipidemia Father    Migraines Neg Hx    Headache Neg Hx     Past Medical History:  Diagnosis Date   Asthma    Bronchitis    GERD (gastroesophageal reflux disease)    High cholesterol    Hypertension    Pancreatitis    Tobacco abuse     Past Surgical History:  Procedure Laterality Date   TRANSURETHRAL RESECTION OF BLADDER TUMOR WITH MITOMYCIN-C N/A 09/24/2021   Procedure: CYSTOSCOPY; CLOT EVACUATION; [place];  Surgeon: [name], MD;  Location: [facility];  Service: Urology;  Laterality: N/A;     Current Outpatient Medications on  File Prior to Visit  Medication Sig Dispense Refill   amLODipine (NORVASC) 10 MG tablet TAKE 1 (ONE) TABLET (10 MG TOTAL) BY MOUTH DAILY. 90 tablet 1   Atogepant (QULIPTA) 60 MG TABS Take 60 mg by mouth daily. 30 tablet 11   Blood Pressure Monitor KIT 1 Bag by Does not apply route 3 (three) times daily as needed. 1 kit 0   budesonide (PULMICORT) 180 MCG/ACT inhaler Inhale 2 puffs into the lungs 2 (two) times daily. 1 each  5   cetirizine (ZYRTEC) 10 MG tablet Take 1 tablet (10 mg total) by mouth daily. 90 tablet 1   famotidine (PEPCID) 20 MG tablet TAKE 1 TABLET (20 MG TOTAL) BY MOUTH DAILY FOR ACID REFLUX 90 tablet 1   fluconazole (DIFLUCAN) 150 MG tablet Take 1 tablet (150 mg total) by mouth daily. 1 tablet 1   fluticasone (FLONASE) 50 MCG/ACT nasal spray Place 1 spray into both nostrils daily. 16 g 0   gabapentin (NEURONTIN) 300 MG capsule Take 1 capsule (300 mg total) by mouth 2 (two) times daily. 180 capsule 1   lidocaine (LIDODERM) 5 % Place 1 patch onto the skin daily. Remove & Discard patch within 12 hours or as directed by MD 30 patch 0   Olopatadine HCl 0.2 % SOLN Place 1 drop into both eyes every morning.      ondansetron (ZOFRAN-ODT) 8 MG disintegrating tablet Take 1 tablet (8 mg total) by mouth every 8 (eight) hours as needed for nausea. 20 tablet 0   oxyCODONE-acetaminophen (PERCOCET/ROXICET) 5-325 MG tablet Take 1 tablet by mouth every 6 (six) hours as needed for severe pain. 9 tablet 0   RESTASIS 0.05 % ophthalmic emulsion PLACE 1 DROP INTO BOTH EYES 2 (TWO) TIMES DAILY. 60 each 3   valsartan-hydrochlorothiazide (DIOVAN-HCT) 160-25 MG tablet Take 1 tablet by mouth daily. 90 tablet 3   VENTOLIN HFA 108 (90 Base) MCG/ACT inhaler INHALE 2 PUFFS INTO THE LUNGS EVERY 6 (SIX) HOURS AS NEEDED FOR WHEEZING OR SHORTNESS OF BREATH. 18 g 2   No current facility-administered medications on file prior to visit.    No Known Allergies  Physical exam:  Today's Vitals   04/19/22 1013  BP:  106/61  Pulse: 71  Weight: 188 lb (85.3 kg)  Height: _0  (1.702 m)   Body mass index is 29.44 kg/m.   Wt Readings from Last 3 Encounters:  04/19/22 188 lb (85.3 kg)  02/22/22 191 lb 12.8 oz (87 kg)  01/18/22 185 lb (83.9 kg)     Ht Readings from Last 3 Encounters:  04/19/22 _1  (1.702 m)  02/22/22 _2  (1.702 m)  01/18/22 _3  (1.676 m)      General: The patient is awake, alert and appears not in acute distress. Head: Normocephalic, atraumatic. Neck is supple. Mallampati 3, red mucosa.  neck circumference:16 inches . Nasal airflow not fully patent.  Retrognathia is not seen.  Dental status: biological  Cardiovascular:  Regular rate and cardiac rhythm by pulse,  without distended neck veins. Respiratory: Lungs are clear to auscultation.  Skin:  Without evidence of ankle edema, or rash. Trunk: The patient's posture is erect.   Neurologic exam : The patient is awake and alert, oriented to place and time.   Memory subjective described as intact.  Attention span & concentration ability appears normal.  Speech is fluent,  with dysarthria, dysphonia - hoarseness is chronic .  Mood and affect are reserved.     Cranial nerves: no loss of smell or taste reported  Pupils are equal and briskly reactive to light. Funduscopic exam deferred. .  Extraocular movements in vertical and horizontal planes were intact and without nystagmus. No Diplopia. Visual fields by finger perimetry are intact. Hearing was intact to soft voice and finger rubbing.    Facial sensation intact to fine touch.  Facial motor strength is symmetric and tongue and uvula move midline.  Neck ROM : rotation, tilt and flexion extension were normal for age , but  the patient feels stiffness . and shoulder shrug was symmetrical but she reports tension. .    Motor exam:  Symmetric bulk, tone and ROM.   Normal tone without cog wheeling, symmetric grip strength .   Sensory:  Fine touch and vibration were  normal.   Proprioception tested in the upper extremities was normal.   Coordination: Rapid alternating movements in the fingers/hands were of normal speed.  The Finger-to-nose maneuver was intact without evidence of ataxia, dysmetria or tremor.   Gait and station: Patient could rise unassisted from a seated position, walked without assistive device. She reports back pain with rising and with walking. Had epidural 13 years ago.  Stance is of normal width/ base and the patient turned with 3 steps.  Toe and heel walk were deferred.  Deep tendon reflexes: in the upper and lower extremities are symmetric and intact.  Babinski response was deferred.       After spending a total time of  45  minutes face to face and additional time for physical and neurologic examination, review of laboratory studies,  personal review of imaging studies, reports and results of other testing and review of referral information / records as far as provided in visit, I have established the following assessments:  1) patient with migraines, with sinus headaches and morning headaches, denies cluster character .   2) history of chronic pain, chronic use of alcohol, pancreatitis.    3) tobacco use, active ; history of asthma, COPD ? Contributes  to sinus headaches? Upper chest: Emphysematous changes. Aortic atherosclerosis.   Aortic Atherosclerosis (ICD10-I70.0) and Emphysema (ICD10-J43.9).    4) loud snoring, choking, EDS and highest fatigue levels. Non restorative sleep.    My Plan is to proceed with:  1) attended sleep study , SPLIT at AHI 20 if medicaid allows.  2) the patient needs a sinus image - CT or MRI brain . 3)   I would like to thank [name], NP and Melvenia Beam, [place],  [place] [address] for allowing me to meet with and to take care of this pleasant patient.   In short, Robin Fischer is presenting with morning headaches with migrainous, sinus pressure and tension  character. 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 She has GERD causing apneas,  nocturia on diuretics. Chronic pain ,I plan to follow up either personally or through our NP within 3-4 months after sleep study..   CC: I will share my notes with Dr Jaynee Eagles and PCP. Marland Kitchen  Electronically signed by: Larey Seat, MD 04/19/2022 10:27 AM  [facility] and [facility] Board certified by The [employer] of Sleep Medicine and Diplomate of the [employer] of Sleep Medicine. Board certified In Neurology through the [employer] [place], Fellow of the [employer] of Neurology. Medical Director of [employer] [facility].

## 2022-04-19 NOTE — Patient Instructions (Signed)
Screening for Sleep Apnea  Sleep apnea is a condition in which breathing pauses or becomes shallow during sleep. Sleep apnea screening is a test to determine if you are at risk for sleep apnea. The test includes a series of questions. It will only takes a few minutes. Your health care provider may ask you to have this test in preparation for surgery or as part of a physical exam. What are the symptoms of sleep apnea? Common symptoms of sleep apnea include: Snoring. Waking up often at night. Daytime sleepiness. Pauses in breathing. Choking or gasping during sleep. Irritability. Forgetfulness. Trouble thinking clearly. Depression. Personality changes. Most people with sleep apnea do not know that they have it. What are the advantages of sleep apnea screening? Getting screened for sleep apnea can help: Ensure your safety. It is important for your health care providers to know whether or not you have sleep apnea, especially if you are having surgery or have other long-term (chronic) health conditions. Improve your health and allow you to get a better night's rest. Restful sleep can help you: Have more energy. Lose weight. Improve high blood pressure. Improve diabetes management. Prevent stroke. Prevent car accidents. What happens during the screening? Screening usually includes being asked a list of questions about your sleep quality. Some questions you may be asked include: Do you snore? Is your sleep restless? Do you have daytime sleepiness? Has a partner or spouse told you that you stop breathing during sleep? Have you had trouble concentrating or memory loss? What is your age? What is your neck circumference? To measure your neck, keep your back straight and gently wrap the tape measure around your neck. Put the tape measure at the middle of your neck, between your chin and collarbone. What is your sex assigned at birth? Do you have or are you being treated for high blood  pressure? If your screening test is positive, you are at risk for the condition. Further testing may be needed to confirm a diagnosis of sleep apnea. Where to find more information You can find screening tools online or at your health care clinic. For more information about sleep apnea screening and healthy sleep, visit these websites: Centers for Disease Control and Prevention: http://www.wolf.info/ American Sleep Apnea Association: www.sleepapnea.org Contact a health care provider if: You think that you may have sleep apnea. Summary Sleep apnea screening can help determine if you are at risk for sleep apnea. It is important for your health care providers to know whether or not you have sleep apnea, especially if you are having surgery or have other chronic health conditions. You may be asked to take a screening test for sleep apnea in preparation for surgery or as part of a physical exam. This information is not intended to replace advice given to you by your health care provider. Make sure you discuss any questions you have with your health care provider. Document Revised: 07/31/2020 Document Reviewed: 07/31/2020 Elsevier Patient Education  [place] Sleep Information, Adult Quality sleep is important for your mental and physical health. It also improves your quality of life. Quality sleep means you: Are asleep for most of the time you are in bed. Fall asleep within 30 minutes. Wake up no more than once a night. Are awake for no longer than 20 minutes if you do wake up during the night. Most adults need 7-8 hours of quality sleep each night. How can poor sleep affect me? If you do not get enough quality sleep,  you may have: Mood swings. Daytime sleepiness. Decreased alertness, reaction time, and concentration. Sleep disorders, such as insomnia and sleep apnea. Difficulty with: Solving problems. Coping with stress. Paying attention. These issues may affect your performance  and productivity at work, school, and home. Lack of sleep may also put you at higher risk for accidents, suicide, and risky behaviors. If you do not get quality sleep, you may also be at higher risk for several health problems, including: Infections. Type 2 diabetes. Heart disease. High blood pressure. Obesity. Worsening of long-term conditions, like arthritis, kidney disease, depression, Parkinson's disease, and epilepsy. What actions can I take to get more quality sleep? Sleep schedule and routine Stick to a sleep schedule. Go to sleep and wake up at about the same time each day. Do not try to sleep less on weekdays and make up for lost sleep on weekends. This does not work. Limit naps during the day to 30 minutes or less. Do not take naps in the late afternoon. Make time to relax before bed. Reading, listening to music, or taking a hot bath promotes quality sleep. Make your bedroom a place that promotes quality sleep. Keep your bedroom dark, quiet, and at a comfortable room temperature. Make sure your bed is comfortable. Avoid using electronic devices that give off bright blue light for 30 minutes before bedtime. Your brain perceives bright blue light as sunlight. This includes television, phones, and computers. If you are lying awake in bed for longer than 20 minutes, get up and do a relaxing activity until you feel sleepy. Lifestyle     Try to get at least 30 minutes of exercise on most days. Do not exercise 2-3 hours before going to bed. Do not use any products that contain nicotine or tobacco. These products include cigarettes, chewing tobacco, and vaping devices, such as e-cigarettes. If you need help quitting, ask your health care provider. Do not drink caffeinated beverages for at least 8 hours before going to bed. Coffee, tea, and some sodas contain caffeine. Do not drink alcohol or eat large meals close to bedtime. Try to get at least 30 minutes of sunlight every day. Morning  sunlight is best. Medical concerns Work with your health care provider to treat medical conditions that may affect sleeping, such as: Nasal obstruction. Snoring. Sleep apnea and other sleep disorders. Talk to your health care provider if you think any of your prescription medicines may cause you to have difficulty falling or staying asleep. If you have sleep problems, talk with a sleep consultant. If you think you have a sleep disorder, talk with your health care provider about getting evaluated by a specialist. Where to find more information Sleep Foundation: sleepfoundation.org American Academy of Sleep Medicine: aasm.org Centers for Disease Control and Prevention (CDC): StoreMirror.com.cy Contact a health care provider if: You have trouble getting to sleep or staying asleep. You often wake up very early in the morning and cannot get back to sleep. You have daytime sleepiness. You have daytime sleep attacks of suddenly falling asleep and sudden muscle weakness (narcolepsy). You have a tingling sensation in your legs with a strong urge to move your legs (restless legs syndrome). You stop breathing briefly during sleep (sleep apnea). You think you have a sleep disorder or are taking a medicine that is affecting your quality of sleep. Summary Most adults need 7-8 hours of quality sleep each night. Getting enough quality sleep is important for your mental and physical health. Make your bedroom a place that  promotes quality sleep, and avoid things that may cause you to have poor sleep, such as alcohol, caffeine, smoking, or large meals. Talk to your health care provider if you have trouble falling asleep or staying asleep. This information is not intended to replace advice given to you by your health care provider. Make sure you discuss any questions you have with your health care provider. Document Revised: 12/15/2021 Document Reviewed: 12/15/2021 Elsevier Patient Education  2023 [employer].

## 2022-04-21 ENCOUNTER — Ambulatory Visit (INDEPENDENT_AMBULATORY_CARE_PROVIDER_SITE_OTHER): Payer: Medicaid Other | Admitting: Primary Care

## 2022-04-25 ENCOUNTER — Telehealth: Payer: Self-pay | Admitting: Neurology

## 2022-04-25 NOTE — Telephone Encounter (Signed)
[facility] healthy blue pending uploaded notes on the portal 

## 2022-04-27 NOTE — Telephone Encounter (Signed)
Checked status on the portal it is still pending.  

## 2022-04-29 ENCOUNTER — Other Ambulatory Visit: Payer: Self-pay

## 2022-04-29 ENCOUNTER — Emergency Department (HOSPITAL_COMMUNITY): Payer: Medicaid Other

## 2022-04-29 ENCOUNTER — Emergency Department (HOSPITAL_COMMUNITY)
Admission: EM | Admit: 2022-04-29 | Discharge: 2022-04-29 | Disposition: A | Discharge status: Home / Self Care | Payer: Medicaid Other | Attending: Emergency Medicine | Admitting: Emergency Medicine

## 2022-04-29 ENCOUNTER — Encounter (HOSPITAL_COMMUNITY): Payer: Self-pay | Admitting: Emergency Medicine

## 2022-04-29 DIAGNOSIS — E860 Dehydration: Secondary | ICD-10-CM

## 2022-04-29 DIAGNOSIS — R112 Nausea with vomiting, unspecified: Secondary | ICD-10-CM

## 2022-04-29 DIAGNOSIS — F17200 Nicotine dependence, unspecified, uncomplicated: Secondary | ICD-10-CM | POA: Diagnosis not present

## 2022-04-29 DIAGNOSIS — R718 Other abnormality of red blood cells: Secondary | ICD-10-CM | POA: Diagnosis not present

## 2022-04-29 DIAGNOSIS — K760 Fatty (change of) liver, not elsewhere classified: Secondary | ICD-10-CM | POA: Diagnosis not present

## 2022-04-29 DIAGNOSIS — Z20822 Contact with and (suspected) exposure to covid-19: Secondary | ICD-10-CM | POA: Diagnosis not present

## 2022-04-29 DIAGNOSIS — I10 Essential (primary) hypertension: Secondary | ICD-10-CM | POA: Insufficient documentation

## 2022-04-29 DIAGNOSIS — R0789 Other chest pain: Secondary | ICD-10-CM | POA: Diagnosis not present

## 2022-04-29 DIAGNOSIS — R5383 Other fatigue: Secondary | ICD-10-CM | POA: Diagnosis present

## 2022-04-29 DIAGNOSIS — Z79899 Other long term (current) drug therapy: Secondary | ICD-10-CM | POA: Insufficient documentation

## 2022-04-29 DIAGNOSIS — D72829 Elevated white blood cell count, unspecified: Secondary | ICD-10-CM | POA: Insufficient documentation

## 2022-04-29 DIAGNOSIS — R059 Cough, unspecified: Secondary | ICD-10-CM | POA: Diagnosis not present

## 2022-04-29 DIAGNOSIS — R1084 Generalized abdominal pain: Secondary | ICD-10-CM | POA: Diagnosis not present

## 2022-04-29 DIAGNOSIS — R197 Diarrhea, unspecified: Secondary | ICD-10-CM | POA: Insufficient documentation

## 2022-04-29 DIAGNOSIS — N179 Acute kidney failure, unspecified: Secondary | ICD-10-CM

## 2022-04-29 DIAGNOSIS — R7401 Elevation of levels of liver transaminase levels: Secondary | ICD-10-CM | POA: Insufficient documentation

## 2022-04-29 DIAGNOSIS — N390 Urinary tract infection, site not specified: Secondary | ICD-10-CM

## 2022-04-29 LAB — COMPREHENSIVE METABOLIC PANEL
ALT: 36 U/L (ref 0–44)
AST: 44 U/L — ABNORMAL HIGH (ref 15–41)
Albumin: 4.6 g/dL (ref 3.5–5.0)
Alkaline Phosphatase: 294 U/L — ABNORMAL HIGH (ref 38–126)
Anion gap: 19 — ABNORMAL HIGH (ref 5–15)
BUN: 16 mg/dL (ref 6–20)
CO2: 28 mmol/L (ref 22–32)
Calcium: 9.9 mg/dL (ref 8.9–10.3)
Chloride: 93 mmol/L — ABNORMAL LOW (ref 98–111)
Creatinine, Ser: 1.57 mg/dL — ABNORMAL HIGH (ref 0.44–1.00)
GFR, Estimated: 39 mL/min — ABNORMAL LOW (ref >60)
Glucose, Bld: 106 mg/dL — ABNORMAL HIGH (ref 70–99)
Potassium: 3.7 mmol/L (ref 3.5–5.1)
Sodium: 140 mmol/L (ref 135–145)
Total Bilirubin: 3.4 mg/dL — ABNORMAL HIGH (ref 0.3–1.2)
Total Protein: 9 g/dL — ABNORMAL HIGH (ref 6.5–8.1)

## 2022-04-29 LAB — URINALYSIS, ROUTINE W REFLEX MICROSCOPIC
Glucose, UA: NEGATIVE mg/dL
Ketones, ur: 5 mg/dL — AB
Nitrite: NEGATIVE
Protein, ur: 100 mg/dL — AB
Specific Gravity, Urine: 1.023 (ref 1.005–1.030)
pH: 6 (ref 5.0–8.0)

## 2022-04-29 LAB — CBC WITH DIFFERENTIAL/PLATELET
Abs Immature Granulocytes: 0.04 10*3/uL (ref 0.00–0.07)
Basophils Absolute: 0.1 10*3/uL (ref 0.0–0.1)
Basophils Relative: 1 %
Eosinophils Absolute: 0 10*3/uL (ref 0.0–0.5)
Eosinophils Relative: 0 %
HCT: 49.4 % — ABNORMAL HIGH (ref 36.0–46.0)
Hemoglobin: 18.3 g/dL — ABNORMAL HIGH (ref 12.0–15.0)
Immature Granulocytes: 0 %
Lymphocytes Relative: 19 %
Lymphs Abs: 2.2 10*3/uL (ref 0.7–4.0)
MCH: 31.3 pg (ref 26.0–34.0)
MCHC: 37 g/dL — ABNORMAL HIGH (ref 30.0–36.0)
MCV: 84.6 fL (ref 80.0–100.0)
Monocytes Absolute: 0.5 10*3/uL (ref 0.1–1.0)
Monocytes Relative: 4 %
Neutro Abs: 8.7 10*3/uL — ABNORMAL HIGH (ref 1.7–7.7)
Neutrophils Relative %: 76 %
Platelets: 355 10*3/uL (ref 150–400)
RBC: 5.84 MIL/uL — ABNORMAL HIGH (ref 3.87–5.11)
RDW: 14.4 % (ref 11.5–15.5)
WBC: 11.5 10*3/uL — ABNORMAL HIGH (ref 4.0–10.5)
nRBC: 0 % (ref 0.0–0.2)

## 2022-04-29 LAB — TROPONIN I (HIGH SENSITIVITY)
Troponin I (High Sensitivity): 10 ng/L (ref <18)
Troponin I (High Sensitivity): 11 ng/L (ref <18)

## 2022-04-29 LAB — ETHANOL: Alcohol, Ethyl (B): <10 mg/dL (ref <10)

## 2022-04-29 LAB — RESP PANEL BY RT-PCR (FLU A&B, COVID) ARPGX2
Influenza A by PCR: NEGATIVE
Influenza B by PCR: NEGATIVE
SARS Coronavirus 2 by RT PCR: NEGATIVE

## 2022-04-29 LAB — LIPASE, BLOOD: Lipase: 22 U/L (ref 11–51)

## 2022-04-29 LAB — MAGNESIUM: Magnesium: 2.3 mg/dL (ref 1.7–2.4)

## 2022-04-29 MED ORDER — CEFDINIR 300 MG PO CAPS: 300.0000 mg | ORAL_CAPSULE | Freq: Two times a day (BID) | ORAL | 0 refills | Status: AC

## 2022-04-29 MED ORDER — FAMOTIDINE 20 MG PO TABS: 20.0000 mg | ORAL_TABLET | Freq: Two times a day (BID) | ORAL | 0 refills | Status: DC

## 2022-04-29 MED ORDER — LACTATED RINGERS BOLUS PEDS
1000.0000 mL | Freq: Once | INTRAVENOUS | Status: AC
  Administered 2022-04-29: 1000 mL via INTRAVENOUS

## 2022-04-29 MED ORDER — ONDANSETRON 4 MG PO TBDP: 4.0000 mg | ORAL_TABLET | Freq: Three times a day (TID) | ORAL | 0 refills | Status: DC | PRN

## 2022-04-29 MED ORDER — MORPHINE SULFATE (PF) 4 MG/ML IV SOLN
4.0000 mg | Freq: Once | INTRAVENOUS | Status: AC
  Administered 2022-04-29: 4 mg via INTRAVENOUS
  Filled 2022-04-29: qty 1

## 2022-04-29 MED ORDER — DROPERIDOL 2.5 MG/ML IJ SOLN
1.2500 mg | Freq: Once | INTRAMUSCULAR | Status: AC
  Administered 2022-04-29: 1.25 mg via INTRAVENOUS
  Filled 2022-04-29: qty 2

## 2022-04-29 MED ORDER — SODIUM CHLORIDE 0.9 % IV SOLN
1.0000 g | INTRAVENOUS | Status: DC
  Administered 2022-04-29: 1 g via INTRAVENOUS
  Filled 2022-04-29: qty 10

## 2022-04-29 MED ORDER — IOHEXOL 300 MG/ML  SOLN
80.0000 mL | Freq: Once | INTRAMUSCULAR | Status: AC | PRN
  Administered 2022-04-29: 80 mL via INTRAVENOUS

## 2022-04-29 NOTE — ED Notes (Signed)
Pt taken in steady to restroom. Pt states " I feel very weak and lightheaded to walk".

## 2022-04-29 NOTE — ED Provider Notes (Signed)
[facility] DEPT Provider Note   CSN: [id number] Arrival date & time: 04/29/22  7711     History  Chief Complaint  Patient presents with   Emesis   Nausea   Leg Numbness     Robin Fischer is a 51 y.o. female. With pmh pancreatitis, HTN, emphysema, alcohol abuse presenting with inability to tolerate p.o. for the past 2 days with generalized fatigue after having an alcohol binge the day prior.  She notes having a few glasses of liquor before she started feeling bad 2 days ago.  She has had been having persistent episodes of nonbloody nonbilious emesis with nonbloody loose stools.  She has also had associated diffuse abdominal pain and cramping associated with burning chest pain from all the vomiting.  She notes a cough productive of clear sputum that is chronic with her smoking.  No increased cough or change in sputum color.  She notes having a recently diagnosed UTI but has been unable to take her antibiotics because of her inability to keep anything down.  She denies any melena or hematochezia or hematemesis.  Denies any history of alcohol withdrawal seizures or DTs.  She is complaining of generalized fatigue and weakness but no localizing weakness, no visual change, no dysarthria, no paresthesias or sensation deficit.   Emesis      Home Medications Prior to Admission medications   Medication Sig Start Date End Date Taking? Authorizing Provider  cefdinir (OMNICEF) 300 MG capsule Take 1 capsule (300 mg total) by mouth 2 (two) times daily for 10 days. 04/29/22 05/09/22 Yes Elgie Congo, MD  famotidine (PEPCID) 20 MG tablet Take 1 tablet (20 mg total) by mouth 2 (two) times daily. 04/29/22  Yes Elgie Congo, MD  ondansetron (ZOFRAN-ODT) 4 MG disintegrating tablet Take 1 tablet (4 mg total) by mouth every 8 (eight) hours as needed for nausea or vomiting. 04/29/22  Yes Elgie Congo, MD  amLODipine (NORVASC) 10 MG tablet TAKE 1 (ONE) TABLET (10  MG TOTAL) BY MOUTH DAILY. 12/22/21   [name], NP  Atogepant (QULIPTA) 60 MG TABS Take 60 mg by mouth daily. 02/22/22   Melvenia Beam, MD  Blood Pressure Monitor KIT 1 Bag by Does not apply route 3 (three) times daily as needed. 09/26/19   [name], NP  budesonide (PULMICORT) 180 MCG/ACT inhaler Inhale 2 puffs into the lungs 2 (two) times daily. 11/11/21   [name], NP  cetirizine (ZYRTEC) 10 MG tablet Take 1 tablet (10 mg total) by mouth daily. 11/11/21   [name], NP  famotidine (PEPCID) 20 MG tablet TAKE 1 TABLET (20 MG TOTAL) BY MOUTH DAILY FOR ACID REFLUX 03/24/22   [name], NP  fluconazole (DIFLUCAN) 150 MG tablet Take 1 tablet (150 mg total) by mouth daily. 11/11/21   [name], NP  fluticasone (FLONASE) 50 MCG/ACT nasal spray Place 1 spray into both nostrils daily. 02/08/22   [name], NP  gabapentin (NEURONTIN) 300 MG capsule Take 1 capsule (300 mg total) by mouth 2 (two) times daily. 11/11/21   [name], NP  lidocaine (LIDODERM) 5 % Place 1 patch onto the skin daily. Remove & Discard patch within 12 hours or as directed by MD 12/22/21   [name], NP  Olopatadine HCl 0.2 % SOLN Place 1 drop into both eyes every morning.  04/03/20   [provider]  ondansetron (ZOFRAN-ODT) 8 MG disintegrating tablet Take 1  tablet (8 mg total) by mouth every 8 (eight) hours as needed for nausea. 01/18/22   Varney Biles, MD  oxyCODONE-acetaminophen (PERCOCET/ROXICET) 5-325 MG tablet Take 1 tablet by mouth every 6 (six) hours as needed for severe pain. 01/18/22   Nanavati, Ankit, MD  RESTASIS 0.05 % ophthalmic emulsion PLACE 1 DROP INTO BOTH EYES 2 (TWO) TIMES DAILY. 02/04/22   [name], NP  traZODone (DESYREL) 50 MG tablet Take 1 tablet (50 mg total) by mouth at bedtime. 04/19/22   Dohmeier, Asencion Partridge, MD  valsartan-hydrochlorothiazide (DIOVAN-HCT) 160-25 MG tablet Take 1 tablet by mouth daily. 12/22/21    [name], NP  VENTOLIN HFA 108 (90 Base) MCG/ACT inhaler INHALE 2 PUFFS INTO THE LUNGS EVERY 6 (SIX) HOURS AS NEEDED FOR WHEEZING OR SHORTNESS OF BREATH. 03/24/22   [name], NP      Allergies    Patient has no known allergies.    Review of Systems   Review of Systems  Gastrointestinal:  Positive for vomiting.    Physical Exam Updated Vital Signs BP 136/75   Pulse 69   Temp 98.8 F (37.1 C) (Oral)   Resp 16   SpO2 99%  Physical Exam Constitutional: Alert and oriented.  Uncomfortable but nontoxic Eyes: Conjunctivae are normal. ENT      Head: Normocephalic and atraumatic.      Nose: No congestion.      Mouth/Throat: Mucous membranes are dry.      Neck: No stridor. Cardiovascular: S1, S2, tachycardic, regular rhythm.  Normal and symmetric distal pulses are present in all extremities.Warm and well perfused. Respiratory: Normal respiratory effort. Breath sounds are normal.  O2 sat 100% on room air. Gastrointestinal: Soft and diffuse nonlocalizing tenderness, not peritonitic  musculoskeletal: Normal range of motion in all extremities.      Right lower leg: No tenderness or edema.      Left lower leg: No tenderness or edema. Neurologic: Normal speech and language.  CN II through XII grossly intact, equal strength of bilateral upper and lower extremities, sensation grossly intact.  No gross focal neurologic deficits are appreciated. Skin: Skin is warm, dry and intact. No rash noted. Psychiatric: Mood and affect are normal. Speech and behavior are normal.  ED Results / Procedures / Treatments   Labs (all labs ordered are listed, but only abnormal results are displayed) Labs Reviewed  COMPREHENSIVE METABOLIC PANEL - Abnormal; Notable for the following components:      Result Value   Chloride 93 (*)    Glucose, Bld 106 (*)    Creatinine, Ser 1.57 (*)    Total Protein 9.0 (*)    AST 44 (*)    Alkaline Phosphatase 294 (*)    Total Bilirubin 3.4 (*)    GFR,  Estimated 39 (*)    Anion gap 19 (*)    All other components within normal limits  URINALYSIS, ROUTINE W REFLEX MICROSCOPIC - Abnormal; Notable for the following components:   Color, Urine AMBER (*)    APPearance HAZY (*)    Hgb urine dipstick SMALL (*)    Bilirubin Urine SMALL (*)    Ketones, ur 5 (*)    Protein, ur 100 (*)    Leukocytes,Ua MODERATE (*)    Bacteria, UA RARE (*)    All other components within normal limits  CBC WITH DIFFERENTIAL/PLATELET - Abnormal; Notable for the following components:   WBC 11.5 (*)    RBC 5.84 (*)    Hemoglobin 18.3 (*)  HCT 49.4 (*)    MCHC 37.0 (*)    Neutro Abs 8.7 (*)    All other components within normal limits  RESP PANEL BY RT-PCR (FLU A&B, COVID) ARPGX2  LIPASE, BLOOD  MAGNESIUM  ETHANOL  TROPONIN I (HIGH SENSITIVITY)  TROPONIN I (HIGH SENSITIVITY)    EKG EKG Interpretation  Date/Time:  Friday April 29 2022 10:27:33 EDT Ventricular Rate:  110 PR Interval:  142 QRS Duration: 79 QT Interval:  351 QTC Calculation: 475 R Axis:   105 Text Interpretation: Sinus tachycardia Biatrial enlargement Nonspecific T abnormalities, lateral leads ST depression inferior leads Confirmed by Georgina Snell [phone]) on 04/29/2022 11:09:22 AM  Radiology CT ABDOMEN PELVIS W CONTRAST  Result Date: 04/29/2022 CLINICAL DATA:  Abdominal pain, vomiting EXAM: CT ABDOMEN AND PELVIS WITH CONTRAST TECHNIQUE: Multidetector CT imaging of the abdomen and pelvis was performed using the standard protocol following bolus administration of intravenous contrast. RADIATION DOSE REDUCTION: This exam was performed according to the departmental dose-optimization program which includes automated exposure control, adjustment of the mA and/or kV according to patient size and/or use of iterative reconstruction technique. CONTRAST:  67m OMNIPAQUE IOHEXOL 300 MG/ML  SOLN COMPARISON:  09/23/2021, 05/04/2019 FINDINGS: Lower chest: No acute abnormality. Hepatobiliary: Mildly  decreased attenuation of the hepatic parenchyma. Liver measures approximately 19 cm in length. No focal liver lesion identified. Unremarkable gallbladder. No hyperdense gallstone. No biliary dilatation. Pancreas: Unremarkable. No pancreatic ductal dilatation or surrounding inflammatory changes. Spleen: Normal in size without focal abnormality. Adrenals/Urinary Tract: Unremarkable adrenal glands. Kidneys enhance symmetrically without focal lesion, stone, or hydronephrosis. Ureters are nondilated. Urinary bladder is decompressed, limiting its evaluation. Stomach/Bowel: Stomach is within normal limits. Appendix appears normal (series 2, image 58). No evidence of bowel wall thickening, distention, or inflammatory changes. Vascular/Lymphatic: Scattered aortoiliac atherosclerotic calcifications without aneurysm. No abdominopelvic lymphadenopathy. Reproductive: Uterus and bilateral adnexa are unremarkable. Other: No free fluid. No abdominopelvic fluid collection. No pneumoperitoneum. No abdominal wall hernia. Musculoskeletal: No acute or significant osseous findings. Mild lower lumbar spondylosis. IMPRESSION: 1. No acute abdominopelvic findings. 2. Hepatomegaly with hepatic steatosis. 3. Aortic atherosclerosis (ICD10-I70.0). Electronically Signed   By: NDavina PokeD.O.   On: 04/29/2022 13:34    Procedures Procedures  Remain on constant cardiac monitoring, initial sinus tachycardia  Medications Ordered in ED Medications  cefTRIAXone (ROCEPHIN) 1 g in sodium chloride 0.9 % 100 mL IVPB (1 g Intravenous New Bag/Given 04/29/22 1320)  lactated ringers bolus PEDS (0 mLs Intravenous Stopped 04/29/22 1246)  morphine (PF) 4 MG/ML injection 4 mg (4 mg Intravenous Given 04/29/22 1145)  droperidol (INAPSINE) 2.5 MG/ML injection 1.25 mg (1.25 mg Intravenous Given 04/29/22 1146)  lactated ringers bolus PEDS (1,000 mLs Intravenous New Bag/Given 04/29/22 1329)  iohexol (OMNIPAQUE) 300 MG/ML solution 80 mL (80 mLs Intravenous  Contrast Given 04/29/22 1305)    ED Course/ Medical Decision Making/ A&P Clinical Course as of 04/29/22 1500  Fri Apr 29, 2022  1246 UA is likely dirty with 11-20 squames but she is endorsing urinary symptoms, has moderate leukocyte esterase 21-50 RBCs and WBCs rare bacteria moderate leukocyte esterase, will order IV Rocephin to cover. [VB]  14132Independent interpretation of CTAP showed no evidence of obstruction.  Radiologist read no acute pathology no abnormalities of pancreas.  EKG had nonspecific ST depression but no reciprocal changes and flat troponins initial 10, repeat 11, not concern for atypical ACS as her chest pain is more burning in nature and suggestive of esophagitis from vomiting.  She is tolerating p.o.  without nausea or vomiting.  Her tachycardia has resolved.  Her beta vitals are stable, discharged with Zofran as needed and cefdinir 300 mg twice daily for 10 days for pyelonephritis and Pepcid daily. [VB]    Clinical Course User Index [VB] Elgie Congo, MD                           Medical Decision Making Robin Fischer is a 51 y.o. female. With pmh pancreatitis, HTN, emphysema, alcohol abuse presenting with inability to tolerate p.o. for the past 2 days with generalized fatigue after having an alcohol binge the day prior.   Based on patient's history and exam, suspect her generalized fatigue and weakness is secondary to dehydration and inability to tolerate p.o and possible UTI or intra-abdominal pathology.  She does have a mild AKI creatinine 1.57 consistent with labs which appear to be hemoconcentrated with elevated hemoglobin 18.3 and white blood cell count 11.5.  Of note, her lipase is within normal limits 22 which does not indicate acute pancreatitis but will obtain CT scan to further evaluate for biliary pathology or complications of chronic pancreatitis such as necrotizing pancreatitis or abscess with elevated AST 44 and Alk phos 294 T bili 3.4.   She has normal  sodium 140, potassium 3.7 and glucose 106.  Normal magnesium.  Unlikely atypical ACS, EKG was sinus tachycardia with nonspecific ST depression T wave changes but will pursue troponin to ensure no atypical ACS although chest pain is burning in nature and likely related to gastritis and esophagitis from vomiting.  We will obtain UA to evaluate for UTI.  Disposition pending work-up and symptom control.  Amount and/or Complexity of Data Reviewed Labs: ordered. Decision-making details documented in ED Course. Radiology: ordered and independent interpretation performed. Decision-making details documented in ED Course. ECG/medicine tests: independent interpretation performed. Decision-making details documented in ED Course.  Risk Prescription drug management.   es Final diagnoses:  Nausea and vomiting, unspecified vomiting type  Urinary tract infection without hematuria, site unspecified  AKI (acute kidney injury) ([place])  Dehydration  Hepatic steatosis    Rx / DC Orders ED Discharge Orders          Ordered    ondansetron (ZOFRAN-ODT) 4 MG disintegrating tablet  Every 8 hours PRN        04/29/22 1454    cefdinir (OMNICEF) 300 MG capsule  2 times daily        04/29/22 1454    famotidine (PEPCID) 20 MG tablet  2 times daily        04/29/22 1455              Elgie Congo, MD 04/29/22 1500

## 2022-04-29 NOTE — ED Provider Triage Note (Signed)
Emergency Medicine Provider Triage Evaluation Note  Robin Fischer , a 51 y.o. female  was evaluated in triage.  Pt complains of N/V, weakness, and subjective tingling/numbness of bilateral legs.  2 hours preceding symptoms, took laxative for some mild constipation.  Feels overall weak and dehydrated, with throat discomfort from the N/V over the last 24 hours.  Denies chest pain, shortness of breath, back pain, fevers, leg pain, diarrhea, or urinary/bowel incontinence.  Denies saddle anesthesia.  No recent injury.  Hx of alcohol abuse and associated pancreatitis.  Usually consumes alcohol every 3 days, several drinks at a time.  Last drink 3 days ago.  No known hx of AAA.  States she has been on an antibiotic for the last few months for a UTI, unsure which one just knows it's a "blue pill".    Review of Systems  Positive:  Negative: See above  Physical Exam  BP (!) 153/86 (BP Location: Right Arm)   Pulse (!) 113   Temp 98.9 F (37.2 C) (Oral)   Resp 20   SpO2 100%  Gen:   Awake, no distress, appears uncomfortable, actively spits a few times into emesis bag   Resp:  Normal effort, CTAB MSK:   Moves extremities without difficulty, with exception of lower extremities with mild difficulty while stating she feels tired Other:  Mild belching appreciated. Abdomen soft, non-tender.  Chest and back non-TTP.  No pulsatile mass appreciated.  Sensation and strength appears grossly intact of lower extremities.  DP, PT, and radial pulses strong, tachycardic.  Medical Decision Making  Medically screening exam initiated at 10:12 AM.  Appropriate orders placed.  Robin Fischer was informed that the remainder of the evaluation will be completed by another provider, this initial triage assessment does not replace that evaluation, and the importance of remaining in the ED until their evaluation is complete.  Was given 4mg  zofran en route via EMS.   , PA-C 04/29/22 1026

## 2022-04-29 NOTE — Discharge Instructions (Addendum)
You have been seen in the Emergency Department (ED)  today for nausea and vomiting.  Your work up today has not shown a clear cause for your symptoms, but they may be due to a viral infection or food poisoning.  Additionally, you have a urinary tract infection which we gave you IV antibiotics for in the ER.  Continue to take your antibiotics as prescribed and complete the entire course.    Take the Zofran as needed for nausea or vomiting.  Make sure to drink plenty of fluids and avoid alcohol at this time.  Follow up with your doctor as soon as possible, ideally within one week, regarding today's emergent visit and your symptoms of nausea/vomiting.   Return to the Emergency Department (ED)  if you develop severe abdominal pain, bloody vomiting, bloody diarrhea, if you are unable to tolerate fluids due to vomiting, or if you develop other symptoms that concern you.

## 2022-04-29 NOTE — ED Triage Notes (Signed)
BIBA Per EMS: Pt coming from home w/ c/o N/V, weakness & bilateral leg numbness.  12 lead normal.  4mg  zofran given en route 20G L Wrist  134/89 108HR  114 CBG  100% RA

## 2022-05-04 NOTE — Telephone Encounter (Signed)
Split- MCD healthy blue Berkley Harvey: [id number] (exp. 04/25/22 to 07/25/22).  Patient is scheduled at [facility] for 05/27/22 at 9 pm.  Mailed packet to the patient.

## 2022-05-20 ENCOUNTER — Encounter (INDEPENDENT_AMBULATORY_CARE_PROVIDER_SITE_OTHER): Payer: Self-pay | Admitting: Primary Care

## 2022-05-20 ENCOUNTER — Ambulatory Visit (INDEPENDENT_AMBULATORY_CARE_PROVIDER_SITE_OTHER): Payer: Medicaid Other | Admitting: Primary Care

## 2022-05-20 VITALS — BP 122/80 | HR 93 | Resp 16 | Wt 185.2 lb

## 2022-05-20 DIAGNOSIS — Z1211 Encounter for screening for malignant neoplasm of colon: Secondary | ICD-10-CM

## 2022-05-20 DIAGNOSIS — R300 Dysuria: Secondary | ICD-10-CM | POA: Diagnosis not present

## 2022-05-20 DIAGNOSIS — B379 Candidiasis, unspecified: Secondary | ICD-10-CM | POA: Diagnosis not present

## 2022-05-20 LAB — POCT URINALYSIS DIP (CLINITEK)
Blood, UA: NEGATIVE
Glucose, UA: NEGATIVE mg/dL
Leukocytes, UA: NEGATIVE
Nitrite, UA: NEGATIVE
POC PROTEIN,UA: NEGATIVE
Spec Grav, UA: 1.015 (ref 1.010–1.025)
Urobilinogen, UA: 0.2 E.U./dL
pH, UA: 5 (ref 5.0–8.0)

## 2022-05-20 MED ORDER — FLUCONAZOLE 150 MG PO TABS: 150.0000 mg | ORAL_TABLET | Freq: Every day | ORAL | 1 refills | Status: DC

## 2022-05-20 NOTE — Progress Notes (Unsigned)
[facility]   Subjective:  [name] is a 51 y.o. female presents for ED follow up and establish care. She presented  04/29/22, with Nausea and vomiting, unspecified vomiting type Urinary tract infection without hematuria, site unspecified AKI (acute kidney injury) ([place]), Dehydration and Hepatic steatosis. Today she complains of a yeast infection she was prescribed 1 Diflucan after the completion of her antibiotics for urinary tract infection.  She presents today with worsened symptoms of itching, vaginal discharge without odor. Past Medical History:  Diagnosis Date   Asthma    Bronchitis    GERD (gastroesophageal reflux disease)    High cholesterol    Hypertension    Pancreatitis    Tobacco abuse      No Known Allergies    Current Outpatient Medications on File Prior to Visit  Medication Sig Dispense Refill   amLODipine (NORVASC) 10 MG tablet TAKE 1 (ONE) TABLET (10 MG TOTAL) BY MOUTH DAILY. 90 tablet 1   Atogepant (QULIPTA) 60 MG TABS Take 60 mg by mouth daily. 30 tablet 11   Blood Pressure Monitor KIT 1 Bag by Does not apply route 3 (three) times daily as needed. 1 kit 0   budesonide (PULMICORT) 180 MCG/ACT inhaler Inhale 2 puffs into the lungs 2 (two) times daily. 1 each 5   cetirizine (ZYRTEC) 10 MG tablet Take 1 tablet (10 mg total) by mouth daily. 90 tablet 1   famotidine (PEPCID) 20 MG tablet TAKE 1 TABLET (20 MG TOTAL) BY MOUTH DAILY FOR ACID REFLUX 90 tablet 1   famotidine (PEPCID) 20 MG tablet Take 1 tablet (20 mg total) by mouth 2 (two) times daily. 30 tablet 0   fluconazole (DIFLUCAN) 150 MG tablet Take 1 tablet (150 mg total) by mouth daily. 1 tablet 1   fluticasone (FLONASE) 50 MCG/ACT nasal spray Place 1 spray into both nostrils daily. 16 g 0   gabapentin (NEURONTIN) 300 MG capsule Take 1 capsule (300 mg total) by mouth 2 (two) times daily. 180 capsule 1   lidocaine (LIDODERM) 5 % Place 1 patch onto the skin daily. Remove & Discard patch  within 12 hours or as directed by MD 30 patch 0   Olopatadine HCl 0.2 % SOLN Place 1 drop into both eyes every morning.      ondansetron (ZOFRAN-ODT) 4 MG disintegrating tablet Take 1 tablet (4 mg total) by mouth every 8 (eight) hours as needed for nausea or vomiting. 20 tablet 0   ondansetron (ZOFRAN-ODT) 8 MG disintegrating tablet Take 1 tablet (8 mg total) by mouth every 8 (eight) hours as needed for nausea. 20 tablet 0   oxyCODONE-acetaminophen (PERCOCET/ROXICET) 5-325 MG tablet Take 1 tablet by mouth every 6 (six) hours as needed for severe pain. 9 tablet 0   RESTASIS 0.05 % ophthalmic emulsion PLACE 1 DROP INTO BOTH EYES 2 (TWO) TIMES DAILY. 60 each 3   traZODone (DESYREL) 50 MG tablet Take 1 tablet (50 mg total) by mouth at bedtime. 30 tablet 3   valsartan-hydrochlorothiazide (DIOVAN-HCT) 160-25 MG tablet Take 1 tablet by mouth daily. 90 tablet 3   VENTOLIN HFA 108 (90 Base) MCG/ACT inhaler INHALE 2 PUFFS INTO THE LUNGS EVERY 6 (SIX) HOURS AS NEEDED FOR WHEEZING OR SHORTNESS OF BREATH. 18 g 2   No current facility-administered medications on file prior to visit.     Review of System: Comprehensive ROS Pertinent positive and negative noted in HPI    Objective:  Comprehensive ROS Pertinent positive and negative noted in  HPI    Physical Exam General Appearance: Well nourished, in no apparent distress. Eyes: PERRLA, EOMs, conjunctiva no swelling or erythema Sinuses: No Frontal/maxillary tenderness ENT/Mouth: Ext aud canals clear,. Hearing normal.  Neck: Supple, thyroid normal.  Respiratory: Respiratory effort normal, BS equal bilaterally without rales, rhonchi, wheezing or stridor.  Cardio: RRR with no MRGs. Brisk peripheral pulses without edema.  Abdomen: Soft, + BS.  Non tender, no guarding, rebound, hernias, masses. Lymphatics: Non tender without lymphadenopathy.  Musculoskeletal: Full ROM, 5/5 strength, normal gait.  Skin: Warm, dry without rashes, lesions, ecchymosis.  Neuro:  Cranial nerves intact. Normal muscle tone, no cerebellar symptoms. Sensation intact.  Psych: Awake and oriented X 3, normal affect, Insight and Judgment appropriate.    Assessment:  Kylie was seen today for hospitalization follow-up and medication refill.  Diagnoses and all orders for this visit:  Screening for colon cancer -     Ambulatory referral to Gastroenterology  Dysuria -     POCT URINALYSIS DIP (CLINITEK)  Candida infection -     fluconazole (DIFLUCAN) 150 MG tablet; Take 1 tablet (150 mg total) by mouth daily.     This note has been created with Surveyor, quantity. Any transcriptional errors are unintentional.   [name], NP 05/20/2022, 9:44 AM

## 2022-05-25 ENCOUNTER — Other Ambulatory Visit (INDEPENDENT_AMBULATORY_CARE_PROVIDER_SITE_OTHER): Payer: Self-pay | Admitting: Primary Care

## 2022-05-25 DIAGNOSIS — I10 Essential (primary) hypertension: Secondary | ICD-10-CM

## 2022-05-25 NOTE — Telephone Encounter (Unsigned)
Copied from [place] [id number]. Topic: General - Other >> May 25, 2022  3:46 PM Everette C wrote: Reason for CRM: Medication Refill - Medication: valsartan-hydrochlorothiazide (DIOVAN-HCT) 160-25 MG tablet [[id number]]   famotidine (PEPCID) 20 MG tablet [[id number]]   amLODipine (NORVASC) 10 MG tablet [[id number]]   fluticasone (FLONASE) 50 MCG/ACT nasal spray [[id number]]   Has the patient contacted their pharmacy? Yes.  The patient has been in contact and been directed to contact their PCP. The patient has anticipated their refills since their appt on 05/20/22 (Agent: If no, request that the patient contact the pharmacy for the refill. If patient does not wish to contact the pharmacy document the reason why and proceed with request.) (Agent: If yes, when and what did the pharmacy advise?)  Preferred Pharmacy (with phone number or street name): [place], [place] - [address] [place] [address] Phone: [phone] Fax: [phone] Hours: Not open 24 hours   Has the patient been seen for an appointment in the last year OR does the patient have an upcoming appointment? Yes.    Agent: Please be advised that RX refills may take up to 3 business days. We ask that you follow-up with your pharmacy.

## 2022-05-26 MED ORDER — VALSARTAN-HYDROCHLOROTHIAZIDE 160-25 MG PO TABS: 1.0000 | ORAL_TABLET | Freq: Every day | ORAL | 3 refills | Status: DC

## 2022-05-26 MED ORDER — FAMOTIDINE 20 MG PO TABS
20.0000 mg | ORAL_TABLET | Freq: Two times a day (BID) | ORAL | 0 refills | Status: DC
Start: 2022-05-26 — End: 2022-06-23

## 2022-05-26 MED ORDER — FLUTICASONE PROPIONATE 50 MCG/ACT NA SUSP: 1.0000 | Freq: Every day | NASAL | 0 refills | Status: DC

## 2022-05-26 NOTE — Telephone Encounter (Signed)
Requested Prescriptions  Pending Prescriptions Disp Refills  . amLODipine (NORVASC) 10 MG tablet [Pharmacy Med Name: AMLODIPINE BESYLATE 10 MG ORAL TABLET] 90 tablet 1    Sig: TAKE 1 (ONE) TABLET (10 MG TOTAL) BY MOUTH DAILY FOR BLOOD PRESSURE     Cardiovascular: Calcium Channel Blockers 2 Passed - 05/25/2022  3:41 PM      Passed - Last BP in normal range    BP Readings from Last 1 Encounters:  05/20/22 122/80         Passed - Last Heart Rate in normal range    Pulse Readings from Last 1 Encounters:  05/20/22 93         Passed - Valid encounter within last 6 months    Recent Outpatient Visits          6 days ago Screening for colon cancer   [place] [name], NP   5 months ago Allergy, subsequent encounter   [place], Michelle P, NP   6 months ago Primary hypertension   [place] [name], NP   8 months ago Screening for diabetes mellitus   [place], Michelle P, NP   2 years ago Essential hypertension   [place], Michelle P, NP      Future Appointments            In 1 month Oletta Lamas, Milford Cage, NP [place]

## 2022-05-26 NOTE — Telephone Encounter (Signed)
Amlodipine has already been sent to pharmacy today.   Requested Prescriptions  Pending Prescriptions Disp Refills  . valsartan-hydrochlorothiazide (DIOVAN-HCT) 160-25 MG tablet 90 tablet 3    Sig: Take 1 tablet by mouth daily.     Cardiovascular: ARB + Diuretic Combos Failed - 05/25/2022  5:41 PM      Failed - Cr in normal range and within 180 days    Creatinine, Ser  Date Value Ref Range Status  04/29/2022 1.57 (H) 0.44 - 1.00 mg/dL Final         Passed - K in normal range and within 180 days    Potassium  Date Value Ref Range Status  04/29/2022 3.7 3.5 - 5.1 mmol/L Final         Passed - Na in normal range and within 180 days    Sodium  Date Value Ref Range Status  04/29/2022 140 135 - 145 mmol/L Final  12/27/2019 141 134 - 144 mmol/L Final         Passed - eGFR is 10 or above and within 180 days    GFR calc Af Amer  Date Value Ref Range Status  12/27/2019 122 >59 mL/min/1.73 Final    Comment:    **[facility] currently reports eGFR in compliance with the current**   recommendations of the [employer]. [facility] will   update reporting as new guidelines are published from the NKF-ASN   Task force.    GFR, Estimated  Date Value Ref Range Status  04/29/2022 39 (L) >60 mL/min Final    Comment:    (NOTE) Calculated using the CKD-EPI Creatinine Equation (2021)          Passed - Patient is not pregnant      Passed - Last BP in normal range    BP Readings from Last 1 Encounters:  05/20/22 122/80         Passed - Valid encounter within last 6 months    Recent Outpatient Visits          6 days ago Screening for colon cancer   [place] [name], NP   5 months ago Allergy, subsequent encounter   [place], [place], NP   6 months ago Primary hypertension   [place], Michelle P, NP   8 months ago Screening for diabetes mellitus   [place], Michelle P, NP   2 years ago Essential hypertension   [place], Michelle P, NP      Future Appointments            In 1 month Oletta Lamas, Milford Cage, NP [place]           . famotidine (PEPCID) 20 MG tablet 30 tablet 0    Sig: Take 1 tablet (20 mg total) by mouth 2 (two) times daily.     Gastroenterology:  H2 Antagonists Passed - 05/25/2022  5:41 PM      Passed - Valid encounter within last 12 months    Recent Outpatient Visits          6 days ago Screening for colon cancer   [place] [name], NP   5 months ago Allergy, subsequent encounter   [place] [name], NP   6 months ago Primary hypertension   [facility] [facility] [name], NP   8 months ago Screening for diabetes mellitus   [place] [name], NP   2 years ago Essential hypertension   [place], [place], NP      Future Appointments            In 1 month Oletta Lamas, Milford Cage, NP [place]           . fluticasone (FLONASE) 50 MCG/ACT nasal spray 16 g 0    Sig: Place 1 spray into both nostrils daily.     Ear, Nose, and Throat: Nasal Preparations - Corticosteroids Passed - 05/25/2022  5:41 PM      Passed - Valid encounter within last 12 months    Recent Outpatient Visits          6 days ago Screening for colon cancer   [place], Michelle P, NP   5 months ago Allergy, subsequent encounter   [place], [place], NP   6 months ago Primary hypertension   [place], Michelle P, NP   8 months ago Screening for diabetes mellitus   [place], Michelle P, NP   2 years ago Essential hypertension   [place], Michelle P, NP      Future Appointments            In 1 month [place], Milford Cage, NP [place]           Refused Prescriptions Disp Refills  . amLODipine (NORVASC) 10 MG tablet 90 tablet 1    Sig: TAKE 1 (ONE) TABLET (10 MG TOTAL) BY MOUTH DAILY.     Cardiovascular: Calcium Channel Blockers 2 Passed - 05/25/2022  5:41 PM      Passed - Last BP in normal range    BP Readings from Last 1 Encounters:  05/20/22 122/80         Passed - Last Heart Rate in normal range    Pulse Readings from Last 1 Encounters:  05/20/22 93         Passed - Valid encounter within last 6 months    Recent Outpatient Visits          6 days ago Screening for colon cancer   [place] [name], NP   5 months ago Allergy, subsequent encounter   [place], [place], NP   6 months ago Primary hypertension   [place], Michelle P, NP   8 months ago Screening for diabetes mellitus   [place], Michelle P, NP   2 years ago Essential hypertension   [place], Michelle P, NP      Future Appointments            In 1 month Oletta Lamas, Milford Cage, NP [place]

## 2022-05-26 NOTE — Telephone Encounter (Signed)
Patient r/s for 06/26/22 at 8 pm.   Mailed new packet to the patient.

## 2022-05-26 NOTE — Telephone Encounter (Signed)
Requested medication (s) are due for refill today: yes  Requested medication (s) are on the active medication list: yes  Last refill:  04/29/22 #30/0  Future visit scheduled: yes  Notes to clinic:  Unable to refill per protocol, last refill by another provider.    Requested Prescriptions  Pending Prescriptions Disp Refills   valsartan-hydrochlorothiazide (DIOVAN-HCT) 160-25 MG tablet 90 tablet 3    Sig: Take 1 tablet by mouth daily.     Cardiovascular: ARB + Diuretic Combos Failed - 05/25/2022  5:41 PM      Failed - Cr in normal range and within 180 days    Creatinine, Ser  Date Value Ref Range Status  04/29/2022 1.57 (H) 0.44 - 1.00 mg/dL Final         Passed - K in normal range and within 180 days    Potassium  Date Value Ref Range Status  04/29/2022 3.7 3.5 - 5.1 mmol/L Final         Passed - Na in normal range and within 180 days    Sodium  Date Value Ref Range Status  04/29/2022 140 135 - 145 mmol/L Final  12/27/2019 141 134 - 144 mmol/L Final         Passed - eGFR is 10 or above and within 180 days    GFR calc Af Amer  Date Value Ref Range Status  12/27/2019 122 >59 mL/min/1.73 Final    Comment:    **[facility] currently reports eGFR in compliance with the current**   recommendations of the [employer]. [facility] will   update reporting as new guidelines are published from the NKF-ASN   Task force.    GFR, Estimated  Date Value Ref Range Status  04/29/2022 39 (L) >60 mL/min Final    Comment:    (NOTE) Calculated using the CKD-EPI Creatinine Equation (2021)          Passed - Patient is not pregnant      Passed - Last BP in normal range    BP Readings from Last 1 Encounters:  05/20/22 122/80         Passed - Valid encounter within last 6 months    Recent Outpatient Visits           6 days ago Screening for colon cancer   [place] [name], NP   5 months ago Allergy, subsequent encounter   [place] [name], NP   6 months ago Primary hypertension   [place], Michelle P, NP   8 months ago Screening for diabetes mellitus   [place], Michelle P, NP   2 years ago Essential hypertension   [place], Michelle P, NP       Future Appointments             In 1 month [place], Milford Cage, NP [facility]             famotidine (PEPCID) 20 MG tablet 30 tablet 0    Sig: Take 1 tablet (20 mg total) by mouth 2 (two) times daily.     Gastroenterology:  H2 Antagonists Passed - 05/25/2022  5:41 PM      Passed - Valid encounter within last 12 months    Recent Outpatient Visits           6 days ago Screening for  colon cancer   [facility] [name], NP   5 months ago Allergy, subsequent encounter   [place], [place], NP   6 months ago Primary hypertension   [place] [name], NP   8 months ago Screening for diabetes mellitus   [place], Michelle P, NP   2 years ago Essential hypertension   [place], Michelle P, NP       Future Appointments             In 1 month [place], Milford Cage, NP [facility]            Signed Prescriptions Disp Refills   fluticasone (FLONASE) 50 MCG/ACT nasal spray 16 g 0    Sig: Place 1 spray into both nostrils daily.     Ear, Nose, and Throat: Nasal Preparations - Corticosteroids Passed - 05/25/2022  5:41 PM      Passed - Valid encounter within last 12 months    Recent Outpatient Visits           6 days ago Screening for colon cancer   [place], Michelle P, NP   5 months ago Allergy, subsequent encounter   [place],  [place], NP   6 months ago Primary hypertension   [place], Michelle P, NP   8 months ago Screening for diabetes mellitus   [place], Michelle P, NP   2 years ago Essential hypertension   [place], Michelle P, NP       Future Appointments             In 1 month [place], Milford Cage, NP [facility]            Refused Prescriptions Disp Refills   amLODipine (NORVASC) 10 MG tablet 90 tablet 1    Sig: TAKE 1 (ONE) TABLET (10 MG TOTAL) BY MOUTH DAILY.     Cardiovascular: Calcium Channel Blockers 2 Passed - 05/25/2022  5:41 PM      Passed - Last BP in normal range    BP Readings from Last 1 Encounters:  05/20/22 122/80         Passed - Last Heart Rate in normal range    Pulse Readings from Last 1 Encounters:  05/20/22 93         Passed - Valid encounter within last 6 months    Recent Outpatient Visits           6 days ago Screening for colon cancer   [place] [name], NP   5 months ago Allergy, subsequent encounter   [place], [place], NP   6 months ago Primary hypertension   [place], Michelle P, NP   8 months ago Screening for diabetes mellitus   [place], Michelle P, NP   2 years ago Essential hypertension   [place], Michelle P, NP       Future Appointments             In 1 month Oletta Lamas, Milford Cage, NP [place]

## 2022-05-26 NOTE — Telephone Encounter (Signed)
Will forward to provider  

## 2022-06-23 ENCOUNTER — Other Ambulatory Visit (INDEPENDENT_AMBULATORY_CARE_PROVIDER_SITE_OTHER): Payer: Self-pay | Admitting: Primary Care

## 2022-06-23 DIAGNOSIS — B379 Candidiasis, unspecified: Secondary | ICD-10-CM

## 2022-06-23 NOTE — Telephone Encounter (Signed)
Requested medication (s) are due for refill today: yes  Requested medication (s) are on the active medication list: yes  Last refill:  05/20/22 #1 tab 1 RF  Future visit scheduled: yes  Notes to clinic:  med not assigned to a protocol   Requested Prescriptions  Pending Prescriptions Disp Refills   fluconazole (DIFLUCAN) 150 MG tablet [Pharmacy Med Name: FLUCONAZOLE 150 MG ORAL TABLET] 1 tablet     Sig: TAKE 1 TABLET (150 MG TOTAL) BY MOUTH DAILY.     Off-Protocol Failed - 06/23/2022 10:26 AM      Failed - Medication not assigned to a protocol, review manually.      Passed - Valid encounter within last 12 months    Recent Outpatient Visits           1 month ago Screening for colon cancer   [place] [name], NP   6 months ago Allergy, subsequent encounter   [place], [place], NP   7 months ago Primary hypertension   [place] [name], NP   8 months ago Screening for diabetes mellitus   [place], Michelle P, NP   2 years ago Essential hypertension   [place], Michelle P, NP       Future Appointments             In 1 week Oletta Lamas, Milford Cage, NP [facility]            Signed Prescriptions Disp Refills   famotidine (PEPCID) 20 MG tablet 30 tablet 0    Sig: TAKE 1 TABLET (20 MG TOTAL) BY MOUTH 2 (TWO) TIMES DAILY.     Gastroenterology:  H2 Antagonists Passed - 06/23/2022 10:26 AM      Passed - Valid encounter within last 12 months    Recent Outpatient Visits           1 month ago Screening for colon cancer   [place], Michelle P, NP   6 months ago Allergy, subsequent encounter   [place], [place], NP   7 months ago Primary hypertension   [place] [name],  NP   8 months ago Screening for diabetes mellitus   [place], Michelle P, NP   2 years ago Essential hypertension   [place], Michelle P, NP       Future Appointments             In 1 week [name], NP [place]

## 2022-06-23 NOTE — Telephone Encounter (Signed)
Requested Prescriptions  Pending Prescriptions Disp Refills  . famotidine (PEPCID) 20 MG tablet [Pharmacy Med Name: FAMOTIDINE 20 MG ORAL TABLET] 30 tablet 0    Sig: TAKE 1 TABLET (20 MG TOTAL) BY MOUTH 2 (TWO) TIMES DAILY.     Gastroenterology:  H2 Antagonists Passed - 06/23/2022 10:26 AM      Passed - Valid encounter within last 12 months    Recent Outpatient Visits          1 month ago Screening for colon cancer   [place], Michelle P, NP   6 months ago Allergy, subsequent encounter   [place], [place], NP   7 months ago Primary hypertension   [place] [name], NP   8 months ago Screening for diabetes mellitus   [place], Michelle P, NP   2 years ago Essential hypertension   [place], Michelle P, NP      Future Appointments            In 1 week [name], NP [place]           . fluconazole (DIFLUCAN) 150 MG tablet [Pharmacy Med Name: FLUCONAZOLE 150 MG ORAL TABLET] 1 tablet     Sig: TAKE 1 TABLET (150 MG TOTAL) BY MOUTH DAILY.     Off-Protocol Failed - 06/23/2022 10:26 AM      Failed - Medication not assigned to a protocol, review manually.      Passed - Valid encounter within last 12 months    Recent Outpatient Visits          1 month ago Screening for colon cancer   [place] [name], NP   6 months ago Allergy, subsequent encounter   [place], Michelle P, NP   7 months ago Primary hypertension   [place] [name], NP   8 months ago Screening for diabetes mellitus   [place], Michelle P, NP   2 years ago Essential hypertension   [place], Michelle P, NP      Future Appointments             In 1 week [name], NP [place]

## 2022-06-26 ENCOUNTER — Ambulatory Visit: Payer: Medicaid Other

## 2022-06-26 ENCOUNTER — Telehealth: Payer: Self-pay

## 2022-06-26 NOTE — Telephone Encounter (Addendum)
I called the pt on Sunday night 06/26/22 to see if she was going to make it to her sleep study. She said that I was lying to her. She was informed that her sleep study was on Wed 10/25 @ 9:30 pm. I informed the pt that her sleep study was scheduled for Sunday night 10/22 @ 8 pm. The pt said she called the office on Tues 10/17 and spoke to someone about the reminder call she keep getting. She said whomever she spoke with on the phone told her the appt was Wed 10/25 @ 9:30. I informed the pt she does have an follow up appt with Amy Lomax on 10/25 @ 10 am. She insisted that is not what she was told on the phone and she is not paying $250.00 no show fee because, someone in our office doesn't know what they are talking about. Also, she told me to cancel the follow up visit because, it was to follow up on the sleep study. I told the pt from what I was seeing the follow up appt was from the visit with Dr Jaynee Eagles. She replied that was not correct the appt was to follow up on the sleep study. I apologized to the pt for the miscommunication. I informed her that I would have someone in our office reach back out to her to discuss rescheduling the sleep study. Pt verbalized understanding.   The pt did cancel her original sleep study that was scheduled for 05/27/22.    Pod 1: Can someone please follow up with the pt on the appt with Amy Lomax. I did not cancel the appt. I am unsure if the pt needs to keep that appt or if it needs to be rescheduled after the sleep study is completed.

## 2022-06-27 NOTE — Telephone Encounter (Signed)
Called pt. Advised we do recommend she keep f/u w/ AL,NP 06/29/22. This is her migraine f/u from when she last saw Dr. Jaynee Eagles in June 2023. Pt states her migraines are ok. I asked if she is taking [place] and she states she never got this from the pharmacy, it was never sent.  Advised I reviewed chart and rx was sent to her pharmacy for 1 yr supply Animal nutritionist). She will f/u with them.  Rescheduled f/u to 10/06/21 at 9am with AL,NP.  Aware sleep lab manager will be in contact to discuss sleep study.

## 2022-06-29 ENCOUNTER — Ambulatory Visit: Payer: Medicaid Other | Admitting: Family Medicine

## 2022-06-29 NOTE — Telephone Encounter (Signed)
Patient is r/s for 08/15/22.

## 2022-07-01 ENCOUNTER — Ambulatory Visit (INDEPENDENT_AMBULATORY_CARE_PROVIDER_SITE_OTHER): Payer: Medicaid Other | Admitting: Primary Care

## 2022-07-04 NOTE — Telephone Encounter (Signed)
Mailed new packet

## 2022-07-20 ENCOUNTER — Ambulatory Visit (INDEPENDENT_AMBULATORY_CARE_PROVIDER_SITE_OTHER): Payer: Medicaid Other

## 2022-07-20 DIAGNOSIS — Z23 Encounter for immunization: Secondary | ICD-10-CM

## 2022-07-27 ENCOUNTER — Ambulatory Visit (INDEPENDENT_AMBULATORY_CARE_PROVIDER_SITE_OTHER): Payer: Medicaid Other | Admitting: Primary Care

## 2022-08-01 NOTE — Telephone Encounter (Signed)
Updated auth  Split- MCD healthy blue Berkley Harvey: [id number] (exp. 08/15/22 to 10/16/22)

## 2022-08-15 ENCOUNTER — Telehealth: Payer: Self-pay

## 2022-08-15 NOTE — Telephone Encounter (Signed)
The pt showed up for her sleep study on 12/11. Upon walking into the building the pt stated she did not need to be here as she has a head cold. She said she has congestion and a runny nose. She said it is impacting her ability to sleep at night. I asked the pt did she call to speak with someone in the sleep lab about this. She said she was told she could not miss the appt as she would be charged $250.00 no show fee. I informed the pt since she was sick appt would be cancelled and she would not be charged $250.00 no show fee per sleep lab manager (I did call and speak to sleep lab manger prior to giving this info to pt). I told her once she was feeling better to call us back to reschedule sleep study. I did inform her that the sleep studies are being scheduled in the new year. She verbalized understanding. She said she wanted to go ahead and get this sleep study over with however, she is unable to stay the night with her sickness.

## 2022-08-16 NOTE — Telephone Encounter (Signed)
Noted, thank you

## 2022-09-27 ENCOUNTER — Inpatient Hospital Stay (HOSPITAL_COMMUNITY)
Admission: EM | Admit: 2022-09-27 | Discharge: 2022-09-29 | DRG: 683 | Disposition: A | Discharge status: Home / Self Care | Payer: Medicaid Other | Attending: Family Medicine | Admitting: Family Medicine

## 2022-09-27 ENCOUNTER — Other Ambulatory Visit: Payer: Self-pay

## 2022-09-27 ENCOUNTER — Encounter (HOSPITAL_COMMUNITY): Payer: Self-pay

## 2022-09-27 ENCOUNTER — Emergency Department (HOSPITAL_COMMUNITY): Payer: Medicaid Other

## 2022-09-27 DIAGNOSIS — I10 Essential (primary) hypertension: Secondary | ICD-10-CM | POA: Diagnosis present

## 2022-09-27 DIAGNOSIS — K2100 Gastro-esophageal reflux disease with esophagitis, without bleeding: Secondary | ICD-10-CM | POA: Diagnosis not present

## 2022-09-27 DIAGNOSIS — Z79899 Other long term (current) drug therapy: Secondary | ICD-10-CM

## 2022-09-27 DIAGNOSIS — F17210 Nicotine dependence, cigarettes, uncomplicated: Secondary | ICD-10-CM | POA: Diagnosis present

## 2022-09-27 DIAGNOSIS — F10139 Alcohol abuse with withdrawal, unspecified: Secondary | ICD-10-CM | POA: Diagnosis present

## 2022-09-27 DIAGNOSIS — K2920 Alcoholic gastritis without bleeding: Secondary | ICD-10-CM | POA: Diagnosis present

## 2022-09-27 DIAGNOSIS — F10939 Alcohol use, unspecified with withdrawal, unspecified: Secondary | ICD-10-CM | POA: Diagnosis not present

## 2022-09-27 DIAGNOSIS — E7800 Pure hypercholesterolemia, unspecified: Secondary | ICD-10-CM | POA: Diagnosis present

## 2022-09-27 DIAGNOSIS — K219 Gastro-esophageal reflux disease without esophagitis: Secondary | ICD-10-CM | POA: Diagnosis present

## 2022-09-27 DIAGNOSIS — R112 Nausea with vomiting, unspecified: Principal | ICD-10-CM

## 2022-09-27 DIAGNOSIS — Z8349 Family history of other endocrine, nutritional and metabolic diseases: Secondary | ICD-10-CM

## 2022-09-27 DIAGNOSIS — K759 Inflammatory liver disease, unspecified: Secondary | ICD-10-CM | POA: Insufficient documentation

## 2022-09-27 DIAGNOSIS — K209 Esophagitis, unspecified without bleeding: Secondary | ICD-10-CM

## 2022-09-27 DIAGNOSIS — E871 Hypo-osmolality and hyponatremia: Secondary | ICD-10-CM | POA: Diagnosis present

## 2022-09-27 DIAGNOSIS — I480 Paroxysmal atrial fibrillation: Secondary | ICD-10-CM | POA: Diagnosis present

## 2022-09-27 DIAGNOSIS — J45909 Unspecified asthma, uncomplicated: Secondary | ICD-10-CM | POA: Diagnosis present

## 2022-09-27 DIAGNOSIS — Z7951 Long term (current) use of inhaled steroids: Secondary | ICD-10-CM | POA: Diagnosis not present

## 2022-09-27 DIAGNOSIS — Z8249 Family history of ischemic heart disease and other diseases of the circulatory system: Secondary | ICD-10-CM

## 2022-09-27 DIAGNOSIS — K2900 Acute gastritis without bleeding: Secondary | ICD-10-CM | POA: Diagnosis not present

## 2022-09-27 DIAGNOSIS — N179 Acute kidney failure, unspecified: Secondary | ICD-10-CM | POA: Diagnosis present

## 2022-09-27 DIAGNOSIS — K760 Fatty (change of) liver, not elsewhere classified: Secondary | ICD-10-CM | POA: Diagnosis present

## 2022-09-27 DIAGNOSIS — F1010 Alcohol abuse, uncomplicated: Secondary | ICD-10-CM | POA: Diagnosis present

## 2022-09-27 DIAGNOSIS — I4891 Unspecified atrial fibrillation: Secondary | ICD-10-CM | POA: Diagnosis present

## 2022-09-27 DIAGNOSIS — K920 Hematemesis: Secondary | ICD-10-CM | POA: Diagnosis present

## 2022-09-27 DIAGNOSIS — K7010 Alcoholic hepatitis without ascites: Secondary | ICD-10-CM | POA: Diagnosis present

## 2022-09-27 LAB — URINALYSIS, ROUTINE W REFLEX MICROSCOPIC
Bilirubin Urine: NEGATIVE
Glucose, UA: NEGATIVE mg/dL
Ketones, ur: NEGATIVE mg/dL
Leukocytes,Ua: NEGATIVE
Nitrite: NEGATIVE
Protein, ur: NEGATIVE mg/dL
Specific Gravity, Urine: 1.014 (ref 1.005–1.030)
pH: 7 (ref 5.0–8.0)

## 2022-09-27 LAB — COMPREHENSIVE METABOLIC PANEL
ALT: 43 U/L (ref 0–44)
AST: 86 U/L — ABNORMAL HIGH (ref 15–41)
Albumin: 5.2 g/dL — ABNORMAL HIGH (ref 3.5–5.0)
Alkaline Phosphatase: 272 U/L — ABNORMAL HIGH (ref 38–126)
Anion gap: 23 — ABNORMAL HIGH (ref 5–15)
BUN: 11 mg/dL (ref 6–20)
CO2: 23 mmol/L (ref 22–32)
Calcium: 9.8 mg/dL (ref 8.9–10.3)
Chloride: 86 mmol/L — ABNORMAL LOW (ref 98–111)
Creatinine, Ser: 1.63 mg/dL — ABNORMAL HIGH (ref 0.44–1.00)
GFR, Estimated: 37 mL/min — ABNORMAL LOW (ref >60)
Glucose, Bld: 126 mg/dL — ABNORMAL HIGH (ref 70–99)
Potassium: 3.6 mmol/L (ref 3.5–5.1)
Sodium: 132 mmol/L — ABNORMAL LOW (ref 135–145)
Total Bilirubin: 2.5 mg/dL — ABNORMAL HIGH (ref 0.3–1.2)
Total Protein: 9.3 g/dL — ABNORMAL HIGH (ref 6.5–8.1)

## 2022-09-27 LAB — TROPONIN I (HIGH SENSITIVITY)
Troponin I (High Sensitivity): 6 ng/L (ref <18)
Troponin I (High Sensitivity): 7 ng/L (ref <18)

## 2022-09-27 LAB — BASIC METABOLIC PANEL
Anion gap: 15 (ref 5–15)
BUN: 13 mg/dL (ref 6–20)
CO2: 28 mmol/L (ref 22–32)
Calcium: 8.7 mg/dL — ABNORMAL LOW (ref 8.9–10.3)
Chloride: 87 mmol/L — ABNORMAL LOW (ref 98–111)
Creatinine, Ser: 1.62 mg/dL — ABNORMAL HIGH (ref 0.44–1.00)
GFR, Estimated: 38 mL/min — ABNORMAL LOW (ref >60)
Glucose, Bld: 81 mg/dL (ref 70–99)
Potassium: 3.9 mmol/L (ref 3.5–5.1)
Sodium: 130 mmol/L — ABNORMAL LOW (ref 135–145)

## 2022-09-27 LAB — SAMPLE TO BLOOD BANK

## 2022-09-27 LAB — CBC
HCT: 46 % (ref 36.0–46.0)
Hemoglobin: 16.9 g/dL — ABNORMAL HIGH (ref 12.0–15.0)
MCH: 28.6 pg (ref 26.0–34.0)
MCHC: 36.7 g/dL — ABNORMAL HIGH (ref 30.0–36.0)
MCV: 78 fL — ABNORMAL LOW (ref 80.0–100.0)
Platelets: 395 10*3/uL (ref 150–400)
RBC: 5.9 MIL/uL — ABNORMAL HIGH (ref 3.87–5.11)
RDW: 14.7 % (ref 11.5–15.5)
WBC: 13.8 10*3/uL — ABNORMAL HIGH (ref 4.0–10.5)
nRBC: 0.1 % (ref 0.0–0.2)

## 2022-09-27 LAB — MAGNESIUM: Magnesium: 2.1 mg/dL (ref 1.7–2.4)

## 2022-09-27 LAB — CBG MONITORING, ED: Glucose-Capillary: 86 mg/dL (ref 70–99)

## 2022-09-27 LAB — LIPASE, BLOOD: Lipase: 35 U/L (ref 11–51)

## 2022-09-27 MED ORDER — LACTATED RINGERS IV BOLUS
1000.0000 mL | Freq: Once | INTRAVENOUS | Status: AC
  Administered 2022-09-27: 1000 mL via INTRAVENOUS

## 2022-09-27 MED ORDER — METOPROLOL TARTRATE 5 MG/5ML IV SOLN
2.5000 mg | Freq: Three times a day (TID) | INTRAVENOUS | Status: DC | PRN
  Administered 2022-09-27: 2.5 mg via INTRAVENOUS
  Filled 2022-09-27 (×2): qty 5

## 2022-09-27 MED ORDER — ONDANSETRON 4 MG PO TBDP
4.0000 mg | ORAL_TABLET | Freq: Once | ORAL | Status: AC | PRN
  Administered 2022-09-27: 4 mg via ORAL
  Filled 2022-09-27: qty 1

## 2022-09-27 MED ORDER — OXYCODONE HCL 5 MG PO TABS: 5.0000 mg | ORAL_TABLET | ORAL | Status: DC | PRN

## 2022-09-27 MED ORDER — ONDANSETRON HCL 4 MG/2ML IJ SOLN: 4.0000 mg | Freq: Three times a day (TID) | INTRAMUSCULAR | Status: DC | PRN

## 2022-09-27 MED ORDER — THIAMINE HCL 100 MG/ML IJ SOLN
100.0000 mg | Freq: Every day | INTRAMUSCULAR | Status: DC
  Filled 2022-09-27 (×2): qty 2

## 2022-09-27 MED ORDER — ALBUTEROL SULFATE HFA 108 (90 BASE) MCG/ACT IN AERS: 2.0000 | INHALATION_SPRAY | Freq: Four times a day (QID) | RESPIRATORY_TRACT | Status: DC | PRN

## 2022-09-27 MED ORDER — THIAMINE MONONITRATE 100 MG PO TABS
100.0000 mg | ORAL_TABLET | Freq: Every day | ORAL | Status: DC
  Administered 2022-09-27 – 2022-09-29 (×3): 100 mg via ORAL
  Filled 2022-09-27 (×3): qty 1

## 2022-09-27 MED ORDER — PANTOPRAZOLE SODIUM 40 MG IV SOLR: 40.0000 mg | INTRAVENOUS | Status: DC

## 2022-09-27 MED ORDER — FAMOTIDINE IN NACL 20-0.9 MG/50ML-% IV SOLN
20.0000 mg | Freq: Once | INTRAVENOUS | Status: AC
  Administered 2022-09-27: 20 mg via INTRAVENOUS
  Filled 2022-09-27: qty 50

## 2022-09-27 MED ORDER — ENOXAPARIN SODIUM 40 MG/0.4ML IJ SOSY
40.0000 mg | PREFILLED_SYRINGE | INTRAMUSCULAR | Status: DC
  Administered 2022-09-27 – 2022-09-28 (×2): 40 mg via SUBCUTANEOUS
  Filled 2022-09-27 (×2): qty 0.4

## 2022-09-27 MED ORDER — DILTIAZEM HCL-DEXTROSE 125-5 MG/125ML-% IV SOLN (PREMIX)
5.0000 mg/h | INTRAVENOUS | Status: DC
  Administered 2022-09-27: 5 mg/h via INTRAVENOUS
  Filled 2022-09-27: qty 125

## 2022-09-27 MED ORDER — ACETAMINOPHEN 325 MG PO TABS
650.0000 mg | ORAL_TABLET | Freq: Four times a day (QID) | ORAL | Status: DC | PRN
  Administered 2022-09-27 – 2022-09-28 (×2): 650 mg via ORAL
  Filled 2022-09-27 (×2): qty 2

## 2022-09-27 MED ORDER — SODIUM CHLORIDE 0.9 % IV SOLN: INTRAVENOUS | Status: DC

## 2022-09-27 MED ORDER — CYCLOSPORINE 0.05 % OP EMUL
1.0000 [drp] | Freq: Two times a day (BID) | OPHTHALMIC | Status: DC
  Administered 2022-09-28 – 2022-09-29 (×2): 1 [drp] via OPHTHALMIC
  Filled 2022-09-27 (×5): qty 30

## 2022-09-27 MED ORDER — TRAZODONE HCL 50 MG PO TABS
50.0000 mg | ORAL_TABLET | Freq: Every day | ORAL | Status: DC
  Administered 2022-09-27 – 2022-09-28 (×2): 50 mg via ORAL
  Filled 2022-09-27 (×2): qty 1

## 2022-09-27 MED ORDER — SODIUM CHLORIDE 0.9 % IV BOLUS: 1000.0000 mL | Freq: Once | INTRAVENOUS | Status: DC

## 2022-09-27 MED ORDER — LORAZEPAM 2 MG/ML IJ SOLN
0.0000 mg | Freq: Four times a day (QID) | INTRAMUSCULAR | Status: AC
  Administered 2022-09-27: 1 mg via INTRAVENOUS
  Administered 2022-09-27: 3 mg via INTRAVENOUS
  Administered 2022-09-28: 2 mg via INTRAVENOUS
  Administered 2022-09-28 – 2022-09-29 (×4): 1 mg via INTRAVENOUS
  Filled 2022-09-27 (×6): qty 1
  Filled 2022-09-27: qty 2

## 2022-09-27 MED ORDER — DILTIAZEM LOAD VIA INFUSION
10.0000 mg | Freq: Once | INTRAVENOUS | Status: DC
  Filled 2022-09-27: qty 10

## 2022-09-27 MED ORDER — ALBUTEROL SULFATE (2.5 MG/3ML) 0.083% IN NEBU: 2.5000 mg | INHALATION_SOLUTION | Freq: Four times a day (QID) | RESPIRATORY_TRACT | Status: DC | PRN

## 2022-09-27 MED ORDER — FOLIC ACID 1 MG PO TABS
1.0000 mg | ORAL_TABLET | Freq: Every day | ORAL | Status: DC
  Administered 2022-09-27 – 2022-09-29 (×3): 1 mg via ORAL
  Filled 2022-09-27 (×3): qty 1

## 2022-09-27 MED ORDER — SODIUM CHLORIDE (PF) 0.9 % IJ SOLN
INTRAMUSCULAR | Status: AC
  Filled 2022-09-27: qty 50

## 2022-09-27 MED ORDER — ACETAMINOPHEN 650 MG RE SUPP: 650.0000 mg | Freq: Four times a day (QID) | RECTAL | Status: DC | PRN

## 2022-09-27 MED ORDER — ADULT MULTIVITAMIN W/MINERALS CH
1.0000 | ORAL_TABLET | Freq: Every day | ORAL | Status: DC
  Administered 2022-09-27 – 2022-09-29 (×3): 1 via ORAL
  Filled 2022-09-27 (×3): qty 1

## 2022-09-27 MED ORDER — IOHEXOL 350 MG/ML SOLN
80.0000 mL | Freq: Once | INTRAVENOUS | Status: AC | PRN
  Administered 2022-09-27: 80 mL via INTRAVENOUS

## 2022-09-27 MED ORDER — LORAZEPAM 2 MG/ML IJ SOLN
0.0000 mg | Freq: Two times a day (BID) | INTRAMUSCULAR | Status: DC
  Administered 2022-09-29: 2 mg via INTRAVENOUS
  Filled 2022-09-27: qty 1

## 2022-09-27 MED ORDER — SUCRALFATE 1 GM/10ML PO SUSP
1.0000 g | Freq: Four times a day (QID) | ORAL | Status: DC
  Administered 2022-09-27 – 2022-09-29 (×4): 1 g via ORAL
  Filled 2022-09-27 (×4): qty 10

## 2022-09-27 MED ORDER — FLUTICASONE PROPIONATE 50 MCG/ACT NA SUSP
1.0000 | Freq: Every day | NASAL | Status: DC
  Administered 2022-09-29: 1 via NASAL
  Filled 2022-09-27 (×2): qty 16

## 2022-09-27 MED ORDER — METOCLOPRAMIDE HCL 5 MG/ML IJ SOLN
5.0000 mg | Freq: Once | INTRAMUSCULAR | Status: AC
  Administered 2022-09-27: 5 mg via INTRAVENOUS
  Filled 2022-09-27: qty 2

## 2022-09-27 MED ORDER — PANTOPRAZOLE SODIUM 40 MG IV SOLR
40.0000 mg | Freq: Once | INTRAVENOUS | Status: AC
  Administered 2022-09-27: 40 mg via INTRAVENOUS
  Filled 2022-09-27: qty 10

## 2022-09-27 NOTE — ED Notes (Signed)
MD aware of CIWA score and protocols are in. Will admin protocols

## 2022-09-27 NOTE — H&P (Signed)
History and Physical    Patient: Robin Fischer JIR:[id number] DOB: 29-Nov-1970 DOA: 09/27/2022 DOS: the patient was seen and examined on 09/27/2022 PCP: Grayce Sessions, NP  Patient coming from: Home  Chief Complaint:  Chief Complaint  Patient presents with   Emesis   Abdominal Pain   HPI: Robin Fischer is a 52 y.o. female with medical history significant of asthma, GERD, hypertension, Tobacco abuse, pancreatitis, alcohol use disorder reports persistent nausea and vomiting since yesterday after taking alcohol. She also reports some intermittent left sided abdominal pain and loose bowel movements. She reports having one episode of blood tinged emesis. She denies any fevers or chills, sob or chest pain. She reports some chest pressure , which appears to have resolved. She denies any urinary symptoms.she denies syncope, orthopnea and headache or dizziness.  ED work up  On arrival to ED She is afebrile, tachycardic and normotensive.  Lab work shows elevated wbc count of 13,800, hemoglobin of 16.9 gm, sodium of 132 and creatinine of 1.63, alk pos of 272 and total bilirubin of 2.5. CT angio for eval of GI bleed shows . No evidence of acute gastrointestinal hemorrhage or acute bowel inflammation. 2. Diffuse hepatic steatosis. 3. Small hiatal hernia with mild symmetric distal esophageal wall thickening, which may reflect esophagitis.  She was referred to [facility] for admission for alcohol abuse, alcohol withdrawal symptoms.. Her ED course complicated by new onset of atrial fibrillation with RVR with her rate as high as  157/min converted to sinus with cardizem gtt.    Review of Systems: As mentioned in the history of present illness. All other systems reviewed and are negative. Past Medical History:  Diagnosis Date   Asthma    Bronchitis    GERD (gastroesophageal reflux disease)    High cholesterol    Hypertension    Pancreatitis    Tobacco abuse    Past Surgical History:  Procedure  Laterality Date   TRANSURETHRAL RESECTION OF BLADDER TUMOR WITH MITOMYCIN-C N/A 09/24/2021   Procedure: CYSTOSCOPY; CLOT EVACUATION; FULGERATION OF URETHRA;  Surgeon: Jerilee Field, MD;  Location: [facility];  Service: Urology;  Laterality: N/A;   Social History:  reports that she has been smoking cigarettes. She has been smoking an average of .5 packs per day. She has never used smokeless tobacco. She reports current alcohol use of about 6.0 standard drinks of alcohol per week. She reports that she does not use drugs.  No Known Allergies  Family History  Problem Relation Age of Onset   Hypertension Mother    Hyperlipidemia Mother    Hypertension Father    Hyperlipidemia Father    Migraines Neg Hx    Headache Neg Hx     Prior to Admission medications   Medication Sig Start Date End Date Taking? Authorizing Provider  amLODipine (NORVASC) 10 MG tablet TAKE 1 (ONE) TABLET (10 MG TOTAL) BY MOUTH DAILY FOR BLOOD PRESSURE 05/26/22   Grayce Sessions, NP  Atogepant (QULIPTA) 60 MG TABS Take 60 mg by mouth daily. 02/22/22   Anson Fret, MD  Blood Pressure Monitor KIT 1 Bag by Does not apply route 3 (three) times daily as needed. 09/26/19   Grayce Sessions, NP  budesonide (PULMICORT) 180 MCG/ACT inhaler Inhale 2 puffs into the lungs 2 (two) times daily. 11/11/21   Grayce Sessions, NP  cetirizine (ZYRTEC) 10 MG tablet Take 1 tablet (10 mg total) by mouth daily. 11/11/21   Grayce Sessions, NP  famotidine (PEPCID) 20 MG  tablet TAKE 1 TABLET (20 MG TOTAL) BY MOUTH 2 (TWO) TIMES DAILY. 06/23/22   Grayce Sessions, NP  fluconazole (DIFLUCAN) 150 MG tablet Take 1 tablet (150 mg total) by mouth daily. 05/20/22   Grayce Sessions, NP  fluticasone (FLONASE) 50 MCG/ACT nasal spray Place 1 spray into both nostrils daily. 05/26/22   Grayce Sessions, NP  lidocaine (LIDODERM) 5 % Place 1 patch onto the skin daily. Remove & Discard patch within 12 hours or as directed by MD 12/22/21   Grayce Sessions, NP  Olopatadine HCl 0.2 % SOLN Place 1 drop into both eyes every morning.  04/03/20   [provider]  ondansetron (ZOFRAN-ODT) 4 MG disintegrating tablet Take 1 tablet (4 mg total) by mouth every 8 (eight) hours as needed for nausea or vomiting. 04/29/22   Mardene Sayer, MD  ondansetron (ZOFRAN-ODT) 8 MG disintegrating tablet Take 1 tablet (8 mg total) by mouth every 8 (eight) hours as needed for nausea. 01/18/22   Derwood Kaplan, MD  oxyCODONE-acetaminophen (PERCOCET/ROXICET) 5-325 MG tablet Take 1 tablet by mouth every 6 (six) hours as needed for severe pain. 01/18/22   Nanavati, Ankit, MD  RESTASIS 0.05 % ophthalmic emulsion PLACE 1 DROP INTO BOTH EYES 2 (TWO) TIMES DAILY. 02/04/22   Grayce Sessions, NP  traZODone (DESYREL) 50 MG tablet Take 1 tablet (50 mg total) by mouth at bedtime. 04/19/22   Dohmeier, Porfirio Mylar, MD  valsartan-hydrochlorothiazide (DIOVAN-HCT) 160-25 MG tablet Take 1 tablet by mouth daily. 05/26/22   Grayce Sessions, NP  VENTOLIN HFA 108 (90 Base) MCG/ACT inhaler INHALE 2 PUFFS INTO THE LUNGS EVERY 6 (SIX) HOURS AS NEEDED FOR WHEEZING OR SHORTNESS OF BREATH. 03/24/22   Grayce Sessions, NP    Physical Exam: Vitals:   09/27/22 1230 09/27/22 1300 09/27/22 1330 09/27/22 1400  BP: 116/67 119/68 (!) 140/75 110/79  Pulse: 94 91 98 75  Resp: 16 16 16 16   Temp: 98.3 F (36.8 C)     TempSrc: Oral     SpO2: 94% 96% 98% 97%  Weight:       General exam: Appears calm and comfortable  Respiratory system: Clear to auscultation. Respiratory effort normal. Cardiovascular system: S1 & S2 heard, tachycardic, irregularly irregular.  Gastrointestinal system: Abdomen is nondistended, soft and nontender. Bowel sounds wnl.  Central nervous system: Alert and oriented. No focal neurological deficits. Extremities: Symmetric 5 x 5 power. Skin: No rashes, Psychiatry: anxious and tremulous.   Data Reviewed: Results for orders placed or performed during the  hospital encounter of 09/27/22 (from the past 24 hour(s))  Lipase, blood     Status: None   Collection Time: 09/27/22  9:16 AM  Result Value Ref Range   Lipase 35 11 - 51 U/L  Comprehensive metabolic panel     Status: Abnormal   Collection Time: 09/27/22  9:16 AM  Result Value Ref Range   Sodium 132 (L) 135 - 145 mmol/L   Potassium 3.6 3.5 - 5.1 mmol/L   Chloride 86 (L) 98 - 111 mmol/L   CO2 23 22 - 32 mmol/L   Glucose, Bld 126 (H) 70 - 99 mg/dL   BUN 11 6 - 20 mg/dL   Creatinine, Ser 09/29/22 (H) 0.44 - 1.00 mg/dL   Calcium 9.8 8.9 - 3.47 mg/dL   Total Protein 9.3 (H) 6.5 - 8.1 g/dL   Albumin 5.2 (H) 3.5 - 5.0 g/dL   AST 86 (H) 15 - 41 U/L   ALT  43 0 - 44 U/L   Alkaline Phosphatase 272 (H) 38 - 126 U/L   Total Bilirubin 2.5 (H) 0.3 - 1.2 mg/dL   GFR, Estimated 37 (L) >60 mL/min   Anion gap 23 (H) 5 - 15  CBC     Status: Abnormal   Collection Time: 09/27/22  9:16 AM  Result Value Ref Range   WBC 13.8 (H) 4.0 - 10.5 K/uL   RBC 5.90 (H) 3.87 - 5.11 MIL/uL   Hemoglobin 16.9 (H) 12.0 - 15.0 g/dL   HCT 46.0 36.0 - 46.0 %   MCV 78.0 (L) 80.0 - 100.0 fL   MCH 28.6 26.0 - 34.0 pg   MCHC 36.7 (H) 30.0 - 36.0 g/dL   RDW 14.7 11.5 - 15.5 %   Platelets 395 150 - 400 K/uL   nRBC 0.1 0.0 - 0.2 %  Sample to Blood Bank     Status: None   Collection Time: 09/27/22  9:16 AM  Result Value Ref Range   Blood Bank Specimen SAMPLE AVAILABLE FOR TESTING    Sample Expiration      09/30/2022,2359 Performed at [facility], [place] [address], [place],  [address]   Magnesium     Status: None   Collection Time: 09/27/22  9:16 AM  Result Value Ref Range   Magnesium 2.1 1.7 - 2.4 mg/dL  Urinalysis, Routine w reflex microscopic Urine, Clean Catch     Status: Abnormal   Collection Time: 09/27/22  9:37 AM  Result Value Ref Range   Color, Urine YELLOW YELLOW   APPearance CLEAR CLEAR   Specific Gravity, Urine 1.014 1.005 - 1.030   pH 7.0 5.0 - 8.0   Glucose, UA NEGATIVE  NEGATIVE mg/dL   Hgb urine dipstick SMALL (A) NEGATIVE   Bilirubin Urine NEGATIVE NEGATIVE   Ketones, ur NEGATIVE NEGATIVE mg/dL   Protein, ur NEGATIVE NEGATIVE mg/dL   Nitrite NEGATIVE NEGATIVE   Leukocytes,Ua NEGATIVE NEGATIVE   RBC / HPF 0-5 0 - 5 RBC/hpf   WBC, UA 0-5 0 - 5 WBC/hpf   Bacteria, UA RARE (A) NONE SEEN   Squamous Epithelial / HPF 0-5 0 - 5 /HPF   Mucus PRESENT    Hyaline Casts, UA PRESENT      Assessment and Plan:  Persistent nausea, vomiting with one episode of blood tinged emesis and abdominal pain:  Suspect gastritis from alcohol abuse.  Start her on IV fluids, symptomatic management with IV protonix, IV zofran and carafate.  Will request GI consult in am for possible EGD.    Alcohol withdrawals: She was started on CIWA.  CSW for alcohol cessation resources.    New onset atrial fibrillation, paroxysmal:  brought on by alcohol withdrawals.  Converted to sinus with IV Cardizem.  Check TSH, free t4 and echocardiogram.  Prn metoprolol.  Her CHA2 vasc2 score is 2 . Her Stroke risk was 2.2% per year. She is low moderate risk.  Please start her on aspirin once her gastritis improves and check echocardiogram. Please reach out to cardiology for anti coagulation.    Leukocytosis:  Suspect reactive. Recheck in am.   Elevated AST, alk phos and bilirubin:  Hepatic steatosis on CT.  Possibly from alcoholic hepatitis.    Stage 3a CKD? Vs AKI.  Creatinine on admission is 1.63, last creatinine from 04/2022 is 1.57 and from 01/2022 is 1.03 Hydrate and recheck renal parameters later today.    Hypertension:  Bp parameters are optimal.    GERD On  PPI.   Asthma: No wheezing heard.      Advance Care Planning:   Code Status: Full Code   Consults: none .   Family Communication: none at bedside.   Severity of Illness: The appropriate patient status for this patient is INPATIENT. Inpatient status is judged to be reasonable and necessary in order to provide  the required intensity of service to ensure the patient's safety. The patient's presenting symptoms, physical exam findings, and initial radiographic and laboratory data in the context of their chronic comorbidities is felt to place them at high risk for further clinical deterioration. Furthermore, it is not anticipated that the patient will be medically stable for discharge from the hospital within 2 midnights of admission.   * I certify that at the point of admission it is my clinical judgment that the patient will require inpatient hospital care spanning beyond 2 midnights from the point of admission due to high intensity of service, high risk for further deterioration and high frequency of surveillance required.*  Author: Hosie Poisson, MD 09/27/2022 3:26 PM  For on call review [email or web address].

## 2022-09-27 NOTE — ED Notes (Signed)
ED TO INPATIENT HANDOFF REPORT  ED Nurse Name and Phone #: Suzanna Obey [phone]    S Name/Age/Gender Robin Fischer 52 y.o. female Room/Bed: WA25/WA25  Code Status   Code Status: Full Code  Home/SNF/Other admitted Patient oriented to: self, place, time, and situation Is this baseline? Yes   Triage Complete: Triage complete  Chief Complaint Acute gastritis [K29.00]  Triage Note BIBA with c/o left sided abd pain and n/v x1 day. Tender with palpitation.  Pt is restless in triage and unable to sit still. Hx GERD.    Allergies No Known Allergies  Level of Care/Admitting Diagnosis ED Disposition     ED Disposition  Admit   Condition  --   Comment  Hospital Area: [facility] [[id number]]  Level of Care: Progressive [102]  Admit to Progressive based on following criteria: GI, ENDOCRINE disease patients with GI bleeding, acute liver failure or pancreatitis, stable with diabetic ketoacidosis or thyrotoxicosis (hypothyroid) state.  May admit patient to [facility] or [facility] if equivalent level of care is available:: Yes  Covid Evaluation: Asymptomatic - no recent exposure (last 10 days) testing not required  Diagnosis: Acute gastritis [654650]  Admitting Physician: Kathlen Mody [[id number]]  Attending Physician: Kathlen Mody [phone]  Certification:: I certify this patient will need inpatient services for at least 2 midnights  Estimated Length of Stay: 2          B Medical/Surgery History Past Medical History:  Diagnosis Date   Asthma    Bronchitis    GERD (gastroesophageal reflux disease)    High cholesterol    Hypertension    Pancreatitis    Tobacco abuse    Past Surgical History:  Procedure Laterality Date   TRANSURETHRAL RESECTION OF BLADDER TUMOR WITH MITOMYCIN-C N/A 09/24/2021   Procedure: CYSTOSCOPY; CLOT EVACUATION; FULGERATION OF URETHRA;  Surgeon: Jerilee Field, MD;  Location: [facility];  Service: Urology;  Laterality: N/A;      A IV Location/Drains/Wounds Patient Lines/Drains/Airways Status     Active Line/Drains/Airways     Name Placement date Placement time Site Days   Peripheral IV 09/27/22 20 G Right Antecubital 09/27/22  0935  Antecubital  less than 1            Intake/Output Last 24 hours  Intake/Output Summary (Last 24 hours) at 09/27/2022 1538 Last data filed at 09/27/2022 1327 Gross per 24 hour  Intake 50 ml  Output --  Net 50 ml    Labs/Imaging Results for orders placed or performed during the hospital encounter of 09/27/22 (from the past 48 hour(s))  Lipase, blood     Status: None   Collection Time: 09/27/22  9:16 AM  Result Value Ref Range   Lipase 35 11 - 51 U/L    Comment: Performed at [facility], [address] [place], [place] [address]  Comprehensive metabolic panel     Status: Abnormal   Collection Time: 09/27/22  9:16 AM  Result Value Ref Range   Sodium 132 (L) 135 - 145 mmol/L    Comment: ELECTROLYTES REPEATED TO VERIFY   Potassium 3.6 3.5 - 5.1 mmol/L   Chloride 86 (L) 98 - 111 mmol/L    Comment: ELECTROLYTES REPEATED TO VERIFY   CO2 23 22 - 32 mmol/L    Comment: ELECTROLYTES REPEATED TO VERIFY   Glucose, Bld 126 (H) 70 - 99 mg/dL    Comment: Glucose reference range applies only to samples taken after fasting for at least 8  hours.   BUN 11 6 - 20 mg/dL   Creatinine, Ser 2.70 (H) 0.44 - 1.00 mg/dL   Calcium 9.8 8.9 - 35.0 mg/dL   Total Protein 9.3 (H) 6.5 - 8.1 g/dL   Albumin 5.2 (H) 3.5 - 5.0 g/dL   AST 86 (H) 15 - 41 U/L   ALT 43 0 - 44 U/L   Alkaline Phosphatase 272 (H) 38 - 126 U/L   Total Bilirubin 2.5 (H) 0.3 - 1.2 mg/dL   GFR, Estimated 37 (L) >60 mL/min    Comment: (NOTE) Calculated using the CKD-EPI Creatinine Equation (2021)    Anion gap 23 (H) 5 - 15    Comment: ELECTROLYTES REPEATED TO VERIFY Performed at [facility], [address] [place], [place] [address]   CBC     Status: Abnormal   Collection  Time: 09/27/22  9:16 AM  Result Value Ref Range   WBC 13.8 (H) 4.0 - 10.5 K/uL   RBC 5.90 (H) 3.87 - 5.11 MIL/uL   Hemoglobin 16.9 (H) 12.0 - 15.0 g/dL   HCT 82.9 93.7 - 16.9 %   MCV 78.0 (L) 80.0 - 100.0 fL   MCH 28.6 26.0 - 34.0 pg   MCHC 36.7 (H) 30.0 - 36.0 g/dL   RDW 67.8 93.8 - 10.1 %   Platelets 395 150 - 400 K/uL   nRBC 0.1 0.0 - 0.2 %    Comment: Performed at [facility], [address], [place], [place] [address]  Sample to Blood Bank     Status: None   Collection Time: 09/27/22  9:16 AM  Result Value Ref Range   Blood Bank Specimen SAMPLE AVAILABLE FOR TESTING    Sample Expiration      09/30/2022,2359 Performed at [facility], [address], [place], [place] [address]   Magnesium     Status: None   Collection Time: 09/27/22  9:16 AM  Result Value Ref Range   Magnesium 2.1 1.7 - 2.4 mg/dL    Comment: Performed at [facility], [address] [place], [place] [address]  Urinalysis, Routine w reflex microscopic Urine, Clean Catch     Status: Abnormal   Collection Time: 09/27/22  9:37 AM  Result Value Ref Range   Color, Urine YELLOW YELLOW   APPearance CLEAR CLEAR   Specific Gravity, Urine 1.014 1.005 - 1.030   pH 7.0 5.0 - 8.0   Glucose, UA NEGATIVE NEGATIVE mg/dL   Hgb urine dipstick SMALL (A) NEGATIVE   Bilirubin Urine NEGATIVE NEGATIVE   Ketones, ur NEGATIVE NEGATIVE mg/dL   Protein, ur NEGATIVE NEGATIVE mg/dL   Nitrite NEGATIVE NEGATIVE   Leukocytes,Ua NEGATIVE NEGATIVE   RBC / HPF 0-5 0 - 5 RBC/hpf   WBC, UA 0-5 0 - 5 WBC/hpf   Bacteria, UA RARE (A) NONE SEEN   Squamous Epithelial / HPF 0-5 0 - 5 /HPF   Mucus PRESENT    Hyaline Casts, UA PRESENT     Comment: Performed at [facility], [address], [place], [place] [address]   CT ANGIO GI BLEED  Result Date: 09/27/2022 CLINICAL DATA:  Nausea and vomiting, hematemesis and hematochezia. EXAM: CTA ABDOMEN AND PELVIS WITHOUT  AND WITH CONTRAST TECHNIQUE: Multidetector CT imaging of the abdomen and pelvis was performed using the standard protocol during bolus administration of intravenous contrast. Multiplanar reconstructed images and MIPs were obtained and reviewed to evaluate the vascular anatomy. RADIATION DOSE REDUCTION: This exam was  performed according to the departmental dose-optimization program which includes automated exposure control, adjustment of the mA and/or kV according to patient size and/or use of iterative reconstruction technique. CONTRAST:  30mL OMNIPAQUE IOHEXOL 350 MG/ML SOLN COMPARISON:  Multiple priors including most recent CT April 29, 2022. FINDINGS: VASCULAR Aorta: Aortic atherosclerosis. Normal caliber aorta without aneurysm, dissection, vasculitis or significant stenosis. Celiac: Patent without evidence of aneurysm, dissection, vasculitis or significant stenosis. SMA: Patent without evidence of aneurysm, dissection, vasculitis or significant stenosis. Renals: Accessory bilateral renal arteries with mild ostial stenosis bilaterally secondary to atherosclerotic calcifications. IMA: Patent without evidence of aneurysm, dissection, vasculitis or significant stenosis. Inflow: Patent without evidence of aneurysm, dissection, vasculitis or significant stenosis. Proximal Outflow: Bilateral common femoral and visualized portions of the superficial and profunda femoral arteries are patent without evidence of aneurysm, dissection, vasculitis or significant stenosis. Veins: No obvious venous abnormality within the limitations of this arterial phase study. Review of the MIP images confirms the above findings. NON-VASCULAR Lower chest: No acute abnormality. Mild symmetric distal esophageal wall thickening. Hepatobiliary: Diffuse hepatic steatosis. Gallbladder is unremarkable. No biliary ductal dilation. Pancreas: No pancreatic ductal dilation or evidence of acute inflammation. Spleen: No splenomegaly. Adrenals/Urinary  Tract: Bilateral adrenal glands appear normal. No hydronephrosis. Kidneys demonstrate symmetric enhancement. Nondistended urinary bladder. Stomach/Bowel: Small hiatal hernia. Stomach is unremarkable for degree of distension. No pathologic dilation of small or large bowel. No intraluminal pooling of intravenous contrast material within the small or large bowel identified to suggest acute gastrointestinal hemorrhage. No evidence of acute bowel inflammation. Scattered colonic diverticulosis without findings of acute diverticulitis. Lymphatic: No pathologically enlarged abdominal or pelvic lymph nodes. Reproductive: Uterus and bilateral adnexa are unremarkable. Other: No significant abdominopelvic free fluid. Musculoskeletal: No acute osseous abnormality. Multilevel degenerative change of the spine. IMPRESSION: 1. No evidence of acute gastrointestinal hemorrhage or acute bowel inflammation. 2. Diffuse hepatic steatosis. 3. Small hiatal hernia with mild symmetric distal esophageal wall thickening, which may reflect esophagitis. 4. Scattered colonic diverticulosis without findings of acute diverticulitis. 5.  Aortic Atherosclerosis (ICD10-I70.0). Electronically Signed   By: Dahlia Bailiff M.D.   On: 09/27/2022 11:36    Pending Labs [employer] (From admission, onward)     Start     Ordered   Signed and Held  HIV Antibody (routine testing w rflx)  (HIV Antibody (Routine testing w reflex) panel)  Once,   R        Signed and Held   Signed and Held  Creatinine, serum  (enoxaparin (LOVENOX)    CrCl < 30 ml/min)  Once,   R       Comments: Baseline for enoxaparin therapy IF NOT ALREADY DRAWN.    Signed and Held   Signed and Held  Creatinine, serum  (enoxaparin (LOVENOX)    CrCl < 30 ml/min)  Once,   R       Comments: while on enoxaparin therapy.    Signed and Held            Vitals/Pain Today's Vitals   09/27/22 1230 09/27/22 1300 09/27/22 1330 09/27/22 1400  BP: 116/67 119/68 (!) 140/75 110/79   Pulse: 94 91 98 75  Resp: 16 16 16 16   Temp: 98.3 F (36.8 C)     TempSrc: Oral     SpO2: 94% 96% 98% 97%  Weight:      PainSc:        Isolation Precautions No active isolations  Medications Medications  thiamine (VITAMIN B1) tablet 100 mg (100  mg Oral Given 09/27/22 0935)    Or  thiamine (VITAMIN B1) injection 100 mg ( Intravenous See Alternative 7/32/20 2542)  folic acid (FOLVITE) tablet 1 mg (1 mg Oral Given 09/27/22 0935)  multivitamin with minerals tablet 1 tablet (1 tablet Oral Given 09/27/22 0935)  LORazepam (ATIVAN) injection 0-4 mg (1 mg Intravenous Given 09/27/22 1500)    Followed by  LORazepam (ATIVAN) injection 0-4 mg (has no administration in time range)  sodium chloride 0.9 % bolus 1,000 mL (has no administration in time range)  lactated ringers bolus 1,000 mL (has no administration in time range)  metoprolol tartrate (LOPRESSOR) injection 2.5-5 mg (2.5 mg Intravenous Given 09/27/22 1458)  diltiazem (CARDIZEM) 1 mg/mL load via infusion 10 mg (0 mg Intravenous Hold 09/27/22 1535)    And  diltiazem (CARDIZEM) 125 mg in dextrose 5% 125 mL (1 mg/mL) infusion (0 mg/hr Intravenous Hold 09/27/22 1535)  ondansetron (ZOFRAN) injection 4 mg (has no administration in time range)  ondansetron (ZOFRAN-ODT) disintegrating tablet 4 mg (4 mg Oral Given 09/27/22 0843)  iohexol (OMNIPAQUE) 350 MG/ML injection 80 mL (80 mLs Intravenous Contrast Given 09/27/22 1102)  sodium chloride (PF) 0.9 % injection (  Given 09/27/22 1150)  famotidine (PEPCID) IVPB 20 mg premix (0 mg Intravenous Stopped 09/27/22 1327)  metoCLOPramide (REGLAN) injection 5 mg (5 mg Intravenous Given 09/27/22 1434)  pantoprazole (PROTONIX) injection 40 mg (40 mg Intravenous Given 09/27/22 1510)  lactated ringers bolus 1,000 mL (1,000 mLs Intravenous New Bag/Given 09/27/22 1458)    Mobility walks     Focused Assessments N/a   R Recommendations: See Admitting Provider Note  Report given to:   Additional Notes:  n/a

## 2022-09-27 NOTE — ED Notes (Signed)
Consult to transition of care task was clicked off in error

## 2022-09-27 NOTE — ED Triage Notes (Signed)
BIBA with c/o left sided abd pain and n/v x1 day. Tender with palpitation.  Pt is restless in triage and unable to sit still. Hx GERD.

## 2022-09-27 NOTE — ED Provider Notes (Signed)
[place] [facility] AT [facility] Provider Note   CSN: [id number] Arrival date & time: 09/27/22  0820     History  Chief Complaint  Patient presents with   Emesis   Abdominal Pain    Robin Fischer is a 52 y.o. female.  HPI   52 year old female with past medical history of HTN, HLD, pancreatitis, GERD presents to the emergency department with left-sided abdominal pain and nausea/vomiting that started yesterday.  Patient revealed in triage that she has been having bloody emesis.  States that her stools have been intermittently loose but no bloody stools.  Denies any documented fever but endorses severe weakness and chills.  Patient has history of heavy alcohol use, states that her last drink was yesterday prior to the symptoms.    Home Medications Prior to Admission medications   Medication Sig Start Date End Date Taking? Authorizing Provider  amLODipine (NORVASC) 10 MG tablet TAKE 1 (ONE) TABLET (10 MG TOTAL) BY MOUTH DAILY FOR BLOOD PRESSURE 05/26/22   Grayce Sessions, NP  Atogepant (QULIPTA) 60 MG TABS Take 60 mg by mouth daily. 02/22/22   Anson Fret, MD  Blood Pressure Monitor KIT 1 Bag by Does not apply route 3 (three) times daily as needed. 09/26/19   Grayce Sessions, NP  budesonide (PULMICORT) 180 MCG/ACT inhaler Inhale 2 puffs into the lungs 2 (two) times daily. 11/11/21   Grayce Sessions, NP  cetirizine (ZYRTEC) 10 MG tablet Take 1 tablet (10 mg total) by mouth daily. 11/11/21   Grayce Sessions, NP  famotidine (PEPCID) 20 MG tablet TAKE 1 TABLET (20 MG TOTAL) BY MOUTH 2 (TWO) TIMES DAILY. 06/23/22   Grayce Sessions, NP  fluconazole (DIFLUCAN) 150 MG tablet Take 1 tablet (150 mg total) by mouth daily. 05/20/22   Grayce Sessions, NP  fluticasone (FLONASE) 50 MCG/ACT nasal spray Place 1 spray into both nostrils daily. 05/26/22   Grayce Sessions, NP  lidocaine (LIDODERM) 5 % Place 1 patch onto the skin daily. Remove & Discard patch  within 12 hours or as directed by MD 12/22/21   Grayce Sessions, NP  Olopatadine HCl 0.2 % SOLN Place 1 drop into both eyes every morning.  04/03/20   [provider]  ondansetron (ZOFRAN-ODT) 4 MG disintegrating tablet Take 1 tablet (4 mg total) by mouth every 8 (eight) hours as needed for nausea or vomiting. 04/29/22   Mardene Sayer, MD  ondansetron (ZOFRAN-ODT) 8 MG disintegrating tablet Take 1 tablet (8 mg total) by mouth every 8 (eight) hours as needed for nausea. 01/18/22   Derwood Kaplan, MD  oxyCODONE-acetaminophen (PERCOCET/ROXICET) 5-325 MG tablet Take 1 tablet by mouth every 6 (six) hours as needed for severe pain. 01/18/22   Nanavati, Ankit, MD  RESTASIS 0.05 % ophthalmic emulsion PLACE 1 DROP INTO BOTH EYES 2 (TWO) TIMES DAILY. 02/04/22   Grayce Sessions, NP  traZODone (DESYREL) 50 MG tablet Take 1 tablet (50 mg total) by mouth at bedtime. 04/19/22   Dohmeier, Porfirio Mylar, MD  valsartan-hydrochlorothiazide (DIOVAN-HCT) 160-25 MG tablet Take 1 tablet by mouth daily. 05/26/22   Grayce Sessions, NP  VENTOLIN HFA 108 (90 Base) MCG/ACT inhaler INHALE 2 PUFFS INTO THE LUNGS EVERY 6 (SIX) HOURS AS NEEDED FOR WHEEZING OR SHORTNESS OF BREATH. 03/24/22   Grayce Sessions, NP      Allergies    Patient has no known allergies.    Review of Systems   Review of Systems  Constitutional:  Positive for chills and fatigue. Negative for fever.  Respiratory:  Negative for shortness of breath.   Cardiovascular:  Negative for chest pain.  Gastrointestinal:  Positive for abdominal pain, nausea and vomiting. Negative for blood in stool and diarrhea.  Genitourinary:  Negative for flank pain.  Musculoskeletal:  Negative for back pain.  Skin:  Negative for rash.  Neurological:  Negative for headaches.    Physical Exam Updated Vital Signs BP 106/70   Pulse 97   Temp 97.9 F (36.6 C) (Oral)   Resp 20   Wt 84 kg   SpO2 94%   BMI 29.00 kg/m  Physical Exam Vitals and nursing note  reviewed.  Constitutional:      Appearance: Normal appearance. She is ill-appearing.  HENT:     Head: Normocephalic.     Mouth/Throat:     Mouth: Mucous membranes are moist.  Cardiovascular:     Rate and Rhythm: Normal rate.  Pulmonary:     Effort: Pulmonary effort is normal. No respiratory distress.  Abdominal:     General: Bowel sounds are decreased. There is no distension.     Palpations: Abdomen is soft.     Tenderness: There is abdominal tenderness in the periumbilical area and left lower quadrant. There is no guarding.  Skin:    General: Skin is warm.  Neurological:     Mental Status: She is alert and oriented to person, place, and time. Mental status is at baseline.  Psychiatric:        Mood and Affect: Mood normal.     ED Results / Procedures / Treatments   Labs (all labs ordered are listed, but only abnormal results are displayed) Labs Reviewed  COMPREHENSIVE METABOLIC PANEL - Abnormal; Notable for the following components:      Result Value   Sodium 132 (*)    Chloride 86 (*)    Glucose, Bld 126 (*)    Creatinine, Ser 1.63 (*)    Total Protein 9.3 (*)    Albumin 5.2 (*)    AST 86 (*)    Alkaline Phosphatase 272 (*)    Total Bilirubin 2.5 (*)    GFR, Estimated 37 (*)    Anion gap 23 (*)    All other components within normal limits  CBC - Abnormal; Notable for the following components:   WBC 13.8 (*)    RBC 5.90 (*)    Hemoglobin 16.9 (*)    MCV 78.0 (*)    MCHC 36.7 (*)    All other components within normal limits  URINALYSIS, ROUTINE W REFLEX MICROSCOPIC - Abnormal; Notable for the following components:   Hgb urine dipstick SMALL (*)    Bacteria, UA RARE (*)    All other components within normal limits  LIPASE, BLOOD  MAGNESIUM  POC OCCULT BLOOD, ED  SAMPLE TO BLOOD BANK    EKG None  Radiology No results found.  Procedures Procedures    Medications Ordered in ED Medications  thiamine (VITAMIN B1) tablet 100 mg (100 mg Oral Given 09/27/22  0935)    Or  thiamine (VITAMIN B1) injection 100 mg ( Intravenous See Alternative 04/14/16 5102)  folic acid (FOLVITE) tablet 1 mg (1 mg Oral Given 09/27/22 0935)  multivitamin with minerals tablet 1 tablet (1 tablet Oral Given 09/27/22 0935)  LORazepam (ATIVAN) injection 0-4 mg (3 mg Intravenous Given 09/27/22 0935)    Followed by  LORazepam (ATIVAN) injection 0-4 mg (has no administration in time range)  iohexol (  OMNIPAQUE) 350 MG/ML injection 80 mL (has no administration in time range)  ondansetron (ZOFRAN-ODT) disintegrating tablet 4 mg (4 mg Oral Given 09/27/22 3428)    ED Course/ Medical Decision Making/ A&P                             Medical Decision Making Amount and/or Complexity of Data Reviewed Labs: ordered.  Risk Prescription drug management. Decision regarding hospitalization.   52 year old female presents emergency department with concern for nausea/vomiting.  She is tachycardic, tremulous on initial exam, tender abdomen.  Is to be in alcohol withdrawal, last drink was yesterday prior to episodes of nausea/vomiting.  Blood work shows a mild leukocytosis, elevated hemoglobin most likely from hemoconcentration, mild dehydration based off the chemistry.  Urinalysis shows no infection.  Magnesium is normal.  CT the abdomen pelvis identifies esophagitis.  Patient has required multiple rounds of IV antinausea medications and Ativan.  On reevaluation and p.o. trial she failed.  Will plan to admit for symptomatic treatment of esophagitis and alcohol withdrawal.  Patients evaluation and results requires admission for further treatment and care.  Spoke with hospitalist Dr. Jeoffrey Massed, reviewed patient's ED course and they accept admission.  Patient agrees with admission plan, offers no new complaints and is stable/unchanged at time of admit.  Of note patient's heart rate jumped up into the 100s, EKG shows atrial fibrillation which appears to be new for the patient.  This was relayed  to the admitting team and medicine and additional evaluation was ordered.  Otherwise blood pressure is stable, continue to plan for admission to hospitalist.        Final Clinical Impression(s) / ED Diagnoses Final diagnoses:  None    Rx / DC Orders ED Discharge Orders     None         Lorelle Gibbs, DO 09/27/22 1522

## 2022-09-27 NOTE — ED Provider Triage Note (Addendum)
Emergency Medicine Provider Triage Evaluation Note  Robin Fischer , a 52 y.o. female  was evaluated in triage.  Pt complains of N/V and L sided abdominal pain x1 day. Also with hematemesis. Unsure of how many times she has vomited. No bloody or melanotic stools. Does have hx of heavy etoh use with last drink yesterday. No hx of etoh wd.   Review of Systems  Positive: Anxiety, restlessness, nausea and vomiting, abdominal pain Negative: Fevers  Physical Exam  BP (!) 106/52 (BP Location: Left Arm)   Pulse (!) 106   Temp 97.9 F (36.6 C) (Oral)   Resp 18   Wt 84 kg   SpO2 100%   BMI 29.00 kg/m  Gen:   Awake, holding emesis basin, tremulous Resp:  Normal effort Abd:   Diffuse abdominal pain  Medical Decision Making  Medically screening exam initiated at 9:01 AM.  Appropriate orders placed.  Robin Fischer was informed that the remainder of the evaluation will be completed by another provider, this initial triage assessment does not replace that evaluation, and the importance of remaining in the ED until their evaluation is complete.  Concern for possible alcohol withdrawal given patient's heavy alcohol use and tremulousness on exam.  Also has hematemesis will obtain CT abdomen pelvis GI protocol.  Will send Labs & sample to blood bank in case patient needs transfusion. Pt given zofran at this time as well. Have discussed with nursing to prioritize pt being brought to a room.    Fransico Meadow, MD 09/27/22 [phone]

## 2022-09-27 NOTE — ED Notes (Signed)
Pt trembling unable to get blood for labs

## 2022-09-27 NOTE — Progress Notes (Signed)
TOC consulted for SA resources. SA resources provided and attached to AVS. No further needs at this time.

## 2022-09-28 ENCOUNTER — Encounter (HOSPITAL_COMMUNITY): Payer: Self-pay | Admitting: Family Medicine

## 2022-09-28 ENCOUNTER — Other Ambulatory Visit (HOSPITAL_COMMUNITY): Payer: Medicaid Other

## 2022-09-28 DIAGNOSIS — N179 Acute kidney failure, unspecified: Secondary | ICD-10-CM

## 2022-09-28 DIAGNOSIS — K759 Inflammatory liver disease, unspecified: Secondary | ICD-10-CM | POA: Insufficient documentation

## 2022-09-28 DIAGNOSIS — E871 Hypo-osmolality and hyponatremia: Secondary | ICD-10-CM | POA: Insufficient documentation

## 2022-09-28 LAB — TSH: TSH: 0.632 u[IU]/mL (ref 0.350–4.500)

## 2022-09-28 LAB — CBC WITH DIFFERENTIAL/PLATELET
Abs Immature Granulocytes: 0.01 10*3/uL (ref 0.00–0.07)
Basophils Absolute: 0.1 10*3/uL (ref 0.0–0.1)
Basophils Relative: 1 %
Eosinophils Absolute: 0.1 10*3/uL (ref 0.0–0.5)
Eosinophils Relative: 1 %
HCT: 36.6 % (ref 36.0–46.0)
Hemoglobin: 13.4 g/dL (ref 12.0–15.0)
Immature Granulocytes: 0 %
Lymphocytes Relative: 31 %
Lymphs Abs: 2.9 10*3/uL (ref 0.7–4.0)
MCH: 29.4 pg (ref 26.0–34.0)
MCHC: 36.6 g/dL — ABNORMAL HIGH (ref 30.0–36.0)
MCV: 80.3 fL (ref 80.0–100.0)
Monocytes Absolute: 0.5 10*3/uL (ref 0.1–1.0)
Monocytes Relative: 6 %
Neutro Abs: 5.6 10*3/uL (ref 1.7–7.7)
Neutrophils Relative %: 61 %
Platelets: 253 10*3/uL (ref 150–400)
RBC: 4.56 MIL/uL (ref 3.87–5.11)
RDW: 14.6 % (ref 11.5–15.5)
WBC: 9.1 10*3/uL (ref 4.0–10.5)
nRBC: 0 % (ref 0.0–0.2)

## 2022-09-28 LAB — BASIC METABOLIC PANEL
Anion gap: 12 (ref 5–15)
BUN: 12 mg/dL (ref 6–20)
CO2: 27 mmol/L (ref 22–32)
Calcium: 8.4 mg/dL — ABNORMAL LOW (ref 8.9–10.3)
Chloride: 91 mmol/L — ABNORMAL LOW (ref 98–111)
Creatinine, Ser: 1.22 mg/dL — ABNORMAL HIGH (ref 0.44–1.00)
GFR, Estimated: 53 mL/min — ABNORMAL LOW (ref >60)
Glucose, Bld: 101 mg/dL — ABNORMAL HIGH (ref 70–99)
Potassium: 3.9 mmol/L (ref 3.5–5.1)
Sodium: 130 mmol/L — ABNORMAL LOW (ref 135–145)

## 2022-09-28 LAB — T4, FREE: Free T4: 1.13 ng/dL — ABNORMAL HIGH (ref 0.61–1.12)

## 2022-09-28 LAB — HIV ANTIBODY (ROUTINE TESTING W REFLEX): HIV Screen 4th Generation wRfx: NONREACTIVE

## 2022-09-28 MED ORDER — NICOTINE 7 MG/24HR TD PT24
7.0000 mg | MEDICATED_PATCH | Freq: Once | TRANSDERMAL | Status: AC
  Administered 2022-09-28: 7 mg via TRANSDERMAL
  Filled 2022-09-28: qty 1

## 2022-09-28 MED ORDER — PANTOPRAZOLE SODIUM 40 MG PO TBEC
40.0000 mg | DELAYED_RELEASE_TABLET | Freq: Two times a day (BID) | ORAL | Status: DC
  Administered 2022-09-28 – 2022-09-29 (×3): 40 mg via ORAL
  Filled 2022-09-28 (×3): qty 1

## 2022-09-28 MED ORDER — METOPROLOL TARTRATE 25 MG PO TABS: 50.0000 mg | ORAL_TABLET | Freq: Two times a day (BID) | ORAL | Status: DC

## 2022-09-28 NOTE — Progress Notes (Signed)
  Progress Note   Patient: Robin Fischer QQI:[id number] DOB: 11/08/1970 DOA: 09/27/2022     1 DOS: the patient was seen and examined on 09/28/2022        Brief hospital course: Mrs. Robin Fischer is a 52 y.o. F with alcohol use, HTN, asthma, hx pancreatitis, and smoking who presented with left sided abdominal pain and nausea/vomiting.   In the ER, CT angiogram of abdomen (obtained due to report of one episode blood tinged emesis) showed no bleeding.  Also no pancreatitis.      1/23: Admitted on fluids     Assessment and Plan: * AKI (acute kidney injury) ([place]) Cr improving to 1.2 today, from baseline 0.5 - Continue IV fluids  Acute gastritis - Continue sucralfate, PPI - IV fluids - Continue ondansetron  Atrial fibrillation ([place]) Resolved. - Hold echo - Follow up with PCP  Hepatitis Due to alcohol use Cessation recommended, modalities discussed.  Maddrey low.  Hyponatremia Due to alcohol use - Continue IV fluids  Alcohol abuse Still having alcohol withdrawal - Continue thiamine and folate - Continue CIWA with on demand lorazepam   Hypertension BP elevated - Continue metoprolol  Asthma - Continue albuterol, FLonase          Subjective: Still unstable with standing, not taking PO well, appears dehydrated, Dizzy, weak, headache, nausea.     Physical Exam: BP 111/67 (BP Location: Right Arm)   Pulse 79   Temp 97.9 F (36.6 C) (Oral)   Resp 18   Wt 84 kg   SpO2 95%   BMI 29.00 kg/m   Adult female, lying in bed, appears tired and disheveled. RRR no mumrurs, no LE edema.  Respiratory rate normal, lungs clear without rales or wheezes. Abdomen soft without swelling.  Has mild pain to palpation throughout, voluntary guarding noted Attention normal, affect blunted, face symmetric, speech fluent, generally weak     Data Reviewed: Basic metabolic panel shows resolving AKI, hyponatremia CBC unremrakable  Family Communication: None  presnet    Disposition: Status is: Inpatient Patient requires ongoing IV fluids to treat dehydration, AKI.  Also needs ongoing treatmnet of alcohol withdrawal.           Author: Edwin Dada, MD 09/28/2022 4:49 PM  For on call review [email or web address].

## 2022-09-28 NOTE — Assessment & Plan Note (Signed)
Resolved. - Hold echo - Follow up with PCP

## 2022-09-28 NOTE — Assessment & Plan Note (Signed)
-  Continue albuterol, FLonase

## 2022-09-28 NOTE — Assessment & Plan Note (Signed)
Still having alcohol withdrawal - Continue thiamine and folate - Continue CIWA with on demand lorazepam

## 2022-09-28 NOTE — Assessment & Plan Note (Signed)
Due to alcohol use - Continue IV fluids

## 2022-09-28 NOTE — Assessment & Plan Note (Signed)
BP elevated - Continue metoprolol

## 2022-09-28 NOTE — Assessment & Plan Note (Signed)
Cr improving to 1.2 today, from baseline 0.5 - Continue IV fluids

## 2022-09-28 NOTE — Assessment & Plan Note (Signed)
Due to alcohol use Cessation recommended, modalities discussed.  Maddrey low.

## 2022-09-28 NOTE — Assessment & Plan Note (Signed)
-  Continue sucralfate, PPI - IV fluids - Continue ondansetron

## 2022-09-28 NOTE — Hospital Course (Signed)
Robin Fischer is a 52 y.o. F with alcohol use, HTN, asthma, hx pancreatitis, and smoking who presented with left sided abdominal pain and nausea/vomiting.   In the ER, CT angiogram of abdomen (obtained due to report of one episode blood tinged emesis) showed no bleeding.  Also no pancreatitis.      1/23: Admitted on fluids

## 2022-09-29 DIAGNOSIS — N179 Acute kidney failure, unspecified: Secondary | ICD-10-CM | POA: Diagnosis not present

## 2022-09-29 LAB — CBC
HCT: 37.7 % (ref 36.0–46.0)
Hemoglobin: 13.5 g/dL (ref 12.0–15.0)
MCH: 29 pg (ref 26.0–34.0)
MCHC: 35.8 g/dL (ref 30.0–36.0)
MCV: 80.9 fL (ref 80.0–100.0)
Platelets: 227 10*3/uL (ref 150–400)
RBC: 4.66 MIL/uL (ref 3.87–5.11)
RDW: 14.7 % (ref 11.5–15.5)
WBC: 7.7 10*3/uL (ref 4.0–10.5)
nRBC: 0 % (ref 0.0–0.2)

## 2022-09-29 LAB — COMPREHENSIVE METABOLIC PANEL
ALT: 29 U/L (ref 0–44)
AST: 59 U/L — ABNORMAL HIGH (ref 15–41)
Albumin: 3.2 g/dL — ABNORMAL LOW (ref 3.5–5.0)
Alkaline Phosphatase: 178 U/L — ABNORMAL HIGH (ref 38–126)
Anion gap: 8 (ref 5–15)
BUN: 7 mg/dL (ref 6–20)
CO2: 28 mmol/L (ref 22–32)
Calcium: 8.6 mg/dL — ABNORMAL LOW (ref 8.9–10.3)
Chloride: 98 mmol/L (ref 98–111)
Creatinine, Ser: 0.89 mg/dL (ref 0.44–1.00)
GFR, Estimated: >60 mL/min (ref >60)
Glucose, Bld: 101 mg/dL — ABNORMAL HIGH (ref 70–99)
Potassium: 3.8 mmol/L (ref 3.5–5.1)
Sodium: 134 mmol/L — ABNORMAL LOW (ref 135–145)
Total Bilirubin: 1.4 mg/dL — ABNORMAL HIGH (ref 0.3–1.2)
Total Protein: 5.8 g/dL — ABNORMAL LOW (ref 6.5–8.1)

## 2022-09-29 MED ORDER — NALTREXONE HCL 50 MG PO TABS: 50.0000 mg | ORAL_TABLET | Freq: Every day | ORAL | 1 refills | Status: DC

## 2022-09-29 MED ORDER — FOLIC ACID 1 MG PO TABS: 1.0000 mg | ORAL_TABLET | Freq: Every day | ORAL | 3 refills | Status: DC

## 2022-09-29 MED ORDER — GABAPENTIN 300 MG PO CAPS: 300.0000 mg | ORAL_CAPSULE | Freq: Three times a day (TID) | ORAL | 0 refills | Status: DC

## 2022-09-29 MED ORDER — VITAMIN B-1 100 MG PO TABS: 100.0000 mg | ORAL_TABLET | Freq: Every day | ORAL | 3 refills | Status: DC

## 2022-09-29 NOTE — Discharge Summary (Signed)
Physician Discharge Summary   Patient: Robin Fischer MRN: [id number] DOB: July 03, 1971  Admit date:     09/27/2022  Discharge date: 09/29/22  Discharge Physician: Alberteen Sam   PCP: Grayce Sessions, NP     Recommendations at discharge:  Follow up with PCP Gwinda Passe in 1-2 weeks after episode of alcoholic hepatitis and atrial fibrillation Dr. Randa Evens:  Please check LFTs in 2 weeks on new naltrexone  If relapse on alcohol, please refer to cardiology for evaluation of atrial fibrillation     Discharge Diagnoses: Principal Problem:   AKI (acute kidney injury) (HCC) Active Problems:   Acute alcoholic gastritis   Acute alcoholic hepatitis   Alcohol abuse   Atrial fibrillation, paroxysmal   Asthma   Hypertension   Hyponatremia     Hospital Course: Robin Fischer is a 52 y.o. F with alcohol use, HTN, asthma, hx pancreatitis, and smoking who presented with left sided abdominal pain and nausea/vomiting.   In the ER, CT angiogram of abdomen (obtained due to report of one episode blood tinged emesis) showed no bleeding.  Also no pancreatitis.        * AKI (acute kidney injury) (HCC) Treated with IV fluids, improved.      Acute gastritis and hepatitis  Due to alcohol.  Treated with fluids, PPI, sucralfate and symptoms resolved, tolerated oral diet well.  No history of melena.  Blood streaked emesis likely from recurrent vomiting, Hgb stable, no GI work up needed at this time.     Maddrey low, steroids not indicated.  Patient desires treatment, resources provided.   Alcohol abuse Minimal withdrawal in the hospital.    Discharged on naltrexone for abstinence.  Discharged on gabapentin 300 TID for one month for subacute alcohol withdrawal.   Atrial fibrillation (HCC) Patient in brief Afib when admitted.  Less than 24 hours.  In setting of excess alcohol use.  Given brief duration and active alcohol use, I do not recommend anticoagulation for  CHA2DS2-Vasc 2.  If she relapses or if she desires, would recommend referral to Cardiology for extended cardiac monitoring.                 The [facility] Controlled Substances Registry was reviewed for this patient prior to discharge.   Consultants: None Procedures performed: None  Disposition: Home Diet recommendation:  Regular  DISCHARGE MEDICATION: Allergies as of 09/29/2022   No Known Allergies      Medication List     TAKE these medications    amLODipine 10 MG tablet Commonly known as: NORVASC TAKE 1 (ONE) TABLET (10 MG TOTAL) BY MOUTH DAILY FOR BLOOD PRESSURE What changed:  how much to take how to take this when to take this additional instructions   Blood Pressure Monitor Kit 1 Bag by Does not apply route 3 (three) times daily as needed.   budesonide 180 MCG/ACT inhaler Commonly known as: PULMICORT Inhale 2 puffs into the lungs 2 (two) times daily. What changed:  when to take this reasons to take this   cetirizine 10 MG tablet Commonly known as: ZYRTEC Take 1 tablet (10 mg total) by mouth daily. What changed:  when to take this reasons to take this   famotidine 20 MG tablet Commonly known as: PEPCID TAKE 1 TABLET (20 MG TOTAL) BY MOUTH 2 (TWO) TIMES DAILY. What changed: when to take this   fluticasone 50 MCG/ACT nasal spray Commonly known as: FLONASE Place 1 spray into both nostrils daily. What changed:  when  to take this reasons to take this   folic acid 1 MG tablet Commonly known as: FOLVITE Take 1 tablet (1 mg total) by mouth daily. Start taking on: September 30, 2022   gabapentin 300 MG capsule Commonly known as: Neurontin Take 1 capsule (300 mg total) by mouth 3 (three) times daily.   lidocaine 5 % Commonly known as: Lidoderm Place 1 patch onto the skin daily. Remove & Discard patch within 12 hours or as directed by MD What changed:  when to take this reasons to take this additional instructions   naltrexone 50 MG  tablet Commonly known as: DEPADE Take 1 tablet (50 mg total) by mouth daily.   Olopatadine HCl 0.2 % Soln Place 1 drop into both eyes daily as needed (for itching).   ondansetron 4 MG disintegrating tablet Commonly known as: ZOFRAN-ODT Take 1 tablet (4 mg total) by mouth every 8 (eight) hours as needed for nausea or vomiting.   Qulipta 60 MG Tabs Generic drug: Atogepant Take 60 mg by mouth daily.   Restasis 0.05 % ophthalmic emulsion Generic drug: cycloSPORINE PLACE 1 DROP INTO BOTH EYES 2 (TWO) TIMES DAILY. What changed:  when to take this reasons to take this   thiamine 100 MG tablet Commonly known as: Vitamin B-1 Take 1 tablet (100 mg total) by mouth daily. Start taking on: September 30, 2022   traZODone 50 MG tablet Commonly known as: DESYREL Take 1 tablet (50 mg total) by mouth at bedtime. What changed:  when to take this reasons to take this   Tylenol 325 MG tablet Generic drug: acetaminophen Take 325-650 mg by mouth every 6 (six) hours as needed for mild pain or headache.   valsartan-hydrochlorothiazide 160-25 MG tablet Commonly known as: DIOVAN-HCT Take 1 tablet by mouth daily.   Ventolin HFA 108 (90 Base) MCG/ACT inhaler Generic drug: albuterol INHALE 2 PUFFS INTO THE LUNGS EVERY 6 (SIX) HOURS AS NEEDED FOR WHEEZING OR SHORTNESS OF BREATH.        Follow-up Information     [name], NP. Schedule an appointment as soon as possible for a visit in 1 week(s).   Specialty: Internal Medicine Contact information: [address] [address] [phone]                 Discharge Instructions     Discharge instructions   Complete by: As directed    **IMPORTANT DISCHARGE INSTRUCTIONS**   From Dr. Loleta Books: You were admitted for nausea and vomiting from alcohol This was likely a combination of alcohol related gastritis and hepatitis "Gastritis" just refers to irritation and inflammation of the stomach due to alcohol It  would be treated by stopping alcohol and treating the symptoms with antacids like famotidine/Pepcid and anti nausea medicines like ondansetron, both used as needed "Alcohol related hepatitis" means inflammation of the liver from alcohol It has no specific treatment, other than stopping alcohol completely In your case, I suspect that continued alcohol use would lead to liver failure and cirrhosis and death   To reduce symptoms of alcohol withdrawal: Take gabapentin 300 mg three times daily for the next 4 weeks When you have 9 pills left, take just 2 pills daily for 3 days then take just 1 pill daily for 3 days then you will be tapered off and done  To reduce cravings for alcohol: Take naltrexone 50 mg daily Go see Dr. Oletta Lamas and have her check your liver function tests in 2 weeks Naltrexone makes people's cravings  for alcohol less   For sleep: Take melatonin 3 mg nightly  You may also cut this tab in half  Another good option is to take the gabapentin close to bedtime  And a third option (and this can be combined with gabapentin and melatonin) is to take diphenhydramine (Benadryl) before bed   Increase activity slowly   Complete by: As directed        Discharge Exam: Filed Weights   09/27/22 0833  Weight: 84 kg    General: Pt is alert, awake, not in acute distress Cardiovascular: RRR, nl S1-S2, no murmurs appreciated.   No LE edema.   Respiratory: Normal respiratory rate and rhythm.  CTAB without rales or wheezes. Abdominal: Abdomen soft and non-tender.  No distension or HSM.   Neuro/Psych: Strength symmetric in upper and lower extremities.  Judgment and insight appear normal.   Condition at discharge: good  The results of significant diagnostics from this hospitalization (including imaging, microbiology, ancillary and laboratory) are listed below for reference.   Imaging Studies: CT ANGIO GI BLEED  Result Date: 09/27/2022 CLINICAL DATA:  Nausea and vomiting,  hematemesis and hematochezia. EXAM: CTA ABDOMEN AND PELVIS WITHOUT AND WITH CONTRAST TECHNIQUE: Multidetector CT imaging of the abdomen and pelvis was performed using the standard protocol during bolus administration of intravenous contrast. Multiplanar reconstructed images and MIPs were obtained and reviewed to evaluate the vascular anatomy. RADIATION DOSE REDUCTION: This exam was performed according to the departmental dose-optimization program which includes automated exposure control, adjustment of the mA and/or kV according to patient size and/or use of iterative reconstruction technique. CONTRAST:  18mL OMNIPAQUE IOHEXOL 350 MG/ML SOLN COMPARISON:  Multiple priors including most recent CT April 29, 2022. FINDINGS: VASCULAR Aorta: Aortic atherosclerosis. Normal caliber aorta without aneurysm, dissection, vasculitis or significant stenosis. Celiac: Patent without evidence of aneurysm, dissection, vasculitis or significant stenosis. SMA: Patent without evidence of aneurysm, dissection, vasculitis or significant stenosis. Renals: Accessory bilateral renal arteries with mild ostial stenosis bilaterally secondary to atherosclerotic calcifications. IMA: Patent without evidence of aneurysm, dissection, vasculitis or significant stenosis. Inflow: Patent without evidence of aneurysm, dissection, vasculitis or significant stenosis. Proximal Outflow: Bilateral common femoral and visualized portions of the superficial and profunda femoral arteries are patent without evidence of aneurysm, dissection, vasculitis or significant stenosis. Veins: No obvious venous abnormality within the limitations of this arterial phase study. Review of the MIP images confirms the above findings. NON-VASCULAR Lower chest: No acute abnormality. Mild symmetric distal esophageal wall thickening. Hepatobiliary: Diffuse hepatic steatosis. Gallbladder is unremarkable. No biliary ductal dilation. Pancreas: No pancreatic ductal dilation or evidence  of acute inflammation. Spleen: No splenomegaly. Adrenals/Urinary Tract: Bilateral adrenal glands appear normal. No hydronephrosis. Kidneys demonstrate symmetric enhancement. Nondistended urinary bladder. Stomach/Bowel: Small hiatal hernia. Stomach is unremarkable for degree of distension. No pathologic dilation of small or large bowel. No intraluminal pooling of intravenous contrast material within the small or large bowel identified to suggest acute gastrointestinal hemorrhage. No evidence of acute bowel inflammation. Scattered colonic diverticulosis without findings of acute diverticulitis. Lymphatic: No pathologically enlarged abdominal or pelvic lymph nodes. Reproductive: Uterus and bilateral adnexa are unremarkable. Other: No significant abdominopelvic free fluid. Musculoskeletal: No acute osseous abnormality. Multilevel degenerative change of the spine. IMPRESSION: 1. No evidence of acute gastrointestinal hemorrhage or acute bowel inflammation. 2. Diffuse hepatic steatosis. 3. Small hiatal hernia with mild symmetric distal esophageal wall thickening, which may reflect esophagitis. 4. Scattered colonic diverticulosis without findings of acute diverticulitis. 5.  Aortic Atherosclerosis (ICD10-I70.0). Electronically Signed  By: Dahlia Bailiff M.D.   On: 09/27/2022 11:36    Microbiology: Results for orders placed or performed during the hospital encounter of 04/29/22  Resp Panel by RT-PCR (Flu A&B, Covid) Anterior Nasal Swab     Status: None   Collection Time: 04/29/22 11:50 AM   Specimen: Anterior Nasal Swab  Result Value Ref Range Status   SARS Coronavirus 2 by RT PCR NEGATIVE NEGATIVE Final    Comment: (NOTE) SARS-CoV-2 target nucleic acids are NOT DETECTED.  The SARS-CoV-2 RNA is generally detectable in upper respiratory specimens during the acute phase of infection. The lowest concentration of SARS-CoV-2 viral copies this assay can detect is 138 copies/mL. A negative result does not preclude  SARS-Cov-2 infection and should not be used as the sole basis for treatment or other patient management decisions. A negative result may occur with  improper specimen collection/handling, submission of specimen other than nasopharyngeal swab, presence of viral mutation(s) within the areas targeted by this assay, and inadequate number of viral copies(<138 copies/mL). A negative result must be combined with clinical observations, patient history, and epidemiological information. The expected result is Negative.  Fact Sheet for Patients:  EntrepreneurPulse.com.au  Fact Sheet for Healthcare Providers:  IncredibleEmployment.be  This test is no t yet approved or cleared by the [place] FDA and  has been authorized for detection and/or diagnosis of SARS-CoV-2 by FDA under an Emergency Use Authorization (EUA). This EUA will remain  in effect (meaning this test can be used) for the duration of the COVID-19 declaration under Section 564(b)(1) of the Act, 21 U.S.C.section 360bbb-3(b)(1), unless the authorization is terminated  or revoked sooner.       Influenza A by PCR NEGATIVE NEGATIVE Final   Influenza B by PCR NEGATIVE NEGATIVE Final    Comment: (NOTE) The Xpert Xpress SARS-CoV-2/FLU/RSV plus assay is intended as an aid in the diagnosis of influenza from Nasopharyngeal swab specimens and should not be used as a sole basis for treatment. Nasal washings and aspirates are unacceptable for Xpert Xpress SARS-CoV-2/FLU/RSV testing.  Fact Sheet for Patients: EntrepreneurPulse.com.au  Fact Sheet for Healthcare Providers: IncredibleEmployment.be  This test is not yet approved or cleared by the [place] FDA and has been authorized for detection and/or diagnosis of SARS-CoV-2 by FDA under an Emergency Use Authorization (EUA). This EUA will remain in effect (meaning this test can be used) for the duration of  the COVID-19 declaration under Section 564(b)(1) of the Act, 21 U.S.C. section 360bbb-3(b)(1), unless the authorization is terminated or revoked.  Performed at [facility], [place] [address], [place], [place] [address]     Labs: CBC: Recent Labs  Lab 09/27/22 0916 09/28/22 0510 09/29/22 0513  WBC 13.8* 9.1 7.7  NEUTROABS  --  5.6  --   HGB 16.9* 13.4 13.5  HCT 46.0 36.6 37.7  MCV 78.0* 80.3 80.9  PLT 395 253 [id number]   Basic Metabolic Panel: Recent Labs  Lab 09/27/22 0916 09/27/22 2020 09/28/22 0510 09/29/22 0513  NA 132* 130* 130* 134*  K 3.6 3.9 3.9 3.8  CL 86* 87* 91* 98  CO2 23 28 27 28   GLUCOSE 126* 81 101* 101*  BUN 11 13 12 7   CREATININE 1.63* 1.62* 1.22* 0.89  CALCIUM 9.8 8.7* 8.4* 8.6*  MG 2.1  --   --   --    Liver Function Tests: Recent Labs  Lab 09/27/22 0916 09/29/22 0513  AST 86* 59*  ALT 43 29  ALKPHOS 272* 178*  BILITOT 2.5* 1.4*  PROT 9.3* 5.8*  ALBUMIN 5.2* 3.2*   CBG: Recent Labs  Lab 09/27/22 2109  GLUCAP 86    Discharge time spent: approximately 35 minutes spent on discharge counseling, evaluation of patient on day of discharge, and coordination of discharge planning with nursing, social work, pharmacy and case management  Signed: Edwin Dada, MD [facility] 09/29/2022

## 2022-09-29 NOTE — TOC Initial Note (Signed)
Transition of Care [facility]) - Initial/Assessment Note    Patient Details  Name: Robin Fischer MRN: [id number] Date of Birth: 10-Dec-1970  [facility]) CM/SW Contact:    Vassie Moselle, LCSW Phone Number: 09/29/2022, 9:21 AM  Clinical Narrative:                 Resources for SA provided to pt while in ED. No further TOC needs identified at this time. Please re-consult should further needs arise.   Expected Discharge Plan: Home/Self Care Barriers to Discharge: No Barriers Identified   Patient Goals and CMS Choice            Expected Discharge Plan and Services In-house Referral: Clinical Social Work Discharge Planning Services: CM Consult   Living arrangements for the past 2 months: Apartment                 DME Arranged: N/A DME Agency: NA                  Prior Living Arrangements/Services Living arrangements for the past 2 months: Apartment   Patient language and need for interpreter reviewed:: Yes Do you feel safe going back to the place where you live?: Yes      Need for Family Participation in Patient Care: No (Comment) Care giver support system in place?: No (comment)   Criminal Activity/Legal Involvement Pertinent to Current Situation/Hospitalization: No - Comment as needed  Activities of Daily Living Home Assistive Devices/Equipment: None ADL Screening (condition at time of admission) Patient's cognitive ability adequate to safely complete daily activities?: Yes Is the patient deaf or have difficulty hearing?: No Does the patient have difficulty seeing, even when wearing glasses/contacts?: No Does the patient have difficulty concentrating, remembering, or making decisions?: No Patient able to express need for assistance with ADLs?: Yes Does the patient have difficulty dressing or bathing?: No Independently performs ADLs?: Yes (appropriate for developmental age) Does the patient have difficulty walking or climbing stairs?: No Weakness  of Legs: None Weakness of Arms/Hands: None  Permission Sought/Granted   Permission granted to share information with : No              Emotional Assessment         Alcohol / Substance Use: Alcohol Use Psych Involvement: No (comment)  Admission diagnosis:  Atrial fibrillation ([place]) [I48.91] Esophagitis [K20.90] Acute gastritis [K29.00] Alcohol withdrawal syndrome with complication (HCC) [H84.696] Nausea and vomiting, unspecified vomiting type [R11.2] Patient Active Problem List   Diagnosis Date Noted   Hyponatremia 09/28/2022   Hepatitis 09/28/2022   AKI (acute kidney injury) ([place]) 09/28/2022   Acute gastritis 09/27/2022   Atrial fibrillation ([place]) 09/27/2022   Chronic migraine with aura 04/19/2022   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   Excessive daytime sleepiness 04/19/2022   Pulmonary emphysema ([place]) 04/19/2022   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  [place] [address] Phone: [phone] Fax: [place], [place] - [address] [place] [address] Phone: [phone] Fax: [phone]     Social Determinants of Health (SDOH) Social History: Mutual: No Food Insecurity (09/28/2022)  Housing: Low Risk  (09/28/2022)   Transportation Needs: No Transportation Needs (09/28/2022)  Utilities: Not At Risk (09/28/2022)  Depression (PHQ2-9): High Risk (05/20/2022)  Tobacco Use: High Risk (09/28/2022)   SDOH Interventions:     Readmission Risk Interventions    09/29/2022    9:20 AM  Readmission Risk Prevention Plan  Post Dischage Appt Complete  Medication Screening Complete  Transportation Screening Complete

## 2022-10-03 ENCOUNTER — Telehealth: Payer: Self-pay

## 2022-10-03 NOTE — Telephone Encounter (Signed)
Transition Care Management Follow-up Telephone Call Date of discharge and from where: 09/29/2022. [facility] How have you been since you were released from the hospital? She said she is okay, just a little wobbly.  She stated she was on the phone trying to schedule an appointment at [facility] when I called.  She said she needs to see Ms Oletta Lamas as soon as possible because she wants to talk to her about her medications.  I explained to her that she can go to [facility] but she said she doesn't want that she only wants to see Sharyn Lull.  Any questions or concerns? Yes- noted above. She said she will discuss her concerns with Sharyn Lull.   Items Reviewed: Did the pt receive and understand the discharge instructions provided? Yes  Medications obtained and verified? Yes - She said she has all of her medications.  Other? No  Any new allergies since your discharge? No  Dietary orders reviewed? No Do you have support at home? Yes   Home Care and Equipment/Supplies: Were home health services ordered? no If so, what is the name of the agency? N/a  Has the agency set up a time to come to the patient's home? not applicable Were any new equipment or medical supplies ordered?  No What is the name of the medical supply agency? N/a Were you able to get the supplies/equipment? not applicable Do you have any questions related to the use of the equipment or supplies? No  Functional Questionnaire: (I = Independent and D = Dependent) ADLs: independent  Follow up appointments reviewed:  PCP Hospital f/u appt confirmed? Yes  Scheduled to see Juluis Mire, NP  - 10/05/2022.  [facility] f/u appt confirmed?  None scheduled at this time    Are transportation arrangements needed? No - she said she will take a cab if needed. She is aware that her insurance plan provides rides to medical appointments but she said she cannot depend on them to get her to her appointments on time.  If their  condition worsens, is the pt aware to call PCP or go to the Emergency Dept.? Yes Was the patient provided with contact information for the PCP's office or ED? Yes Was to pt encouraged to call back with questions or concerns? Yes

## 2022-10-05 ENCOUNTER — Encounter (INDEPENDENT_AMBULATORY_CARE_PROVIDER_SITE_OTHER): Payer: Self-pay | Admitting: Primary Care

## 2022-10-05 ENCOUNTER — Encounter: Payer: Self-pay | Admitting: Gastroenterology

## 2022-10-05 ENCOUNTER — Ambulatory Visit (INDEPENDENT_AMBULATORY_CARE_PROVIDER_SITE_OTHER): Payer: Medicaid Other | Admitting: Primary Care

## 2022-10-05 VITALS — BP 115/81 | HR 80 | Resp 16 | Wt 181.6 lb

## 2022-10-05 DIAGNOSIS — F5105 Insomnia due to other mental disorder: Secondary | ICD-10-CM

## 2022-10-05 DIAGNOSIS — F418 Other specified anxiety disorders: Secondary | ICD-10-CM | POA: Diagnosis not present

## 2022-10-05 DIAGNOSIS — Z09 Encounter for follow-up examination after completed treatment for conditions other than malignant neoplasm: Secondary | ICD-10-CM | POA: Diagnosis not present

## 2022-10-05 DIAGNOSIS — F1010 Alcohol abuse, uncomplicated: Secondary | ICD-10-CM

## 2022-10-05 NOTE — Progress Notes (Signed)
[facility]   Subjective:  Robin Fischer is a 52 y.o. female presents for hospital follow up. Admit date to the hospital was 09/27/22, patient was discharged from the hospital on 09/29/22, patient was admitted for: acute kidney injury-resolved, Acute alcoholic gastritis, Acute alcoholic hepatitis,  Alcohol abuse, Atrial fibrillation, paroxysmal, Asthma, Hypertension and Hyponatremia. She presented with alcohol abuse use,  left sided abdominal pain and nausea/vomiting. She has been emotional depressed and stressed over trying to care for family members, her daughter and choose the bottle for resolution but the bottle was in one hand and precious (dog) in other hand fighting for attension. Since discharge from hospital no alcohol intake but focuses on her daughter and Precious. This is a lot better alternate for depression , anxiety and insomnia.    Past Medical History:  Diagnosis Date   Asthma    Bronchitis    GERD (gastroesophageal reflux disease)    High cholesterol    Hypertension    Pancreatitis    Tobacco abuse      No Known Allergies    Current Outpatient Medications on File Prior to Visit  Medication Sig Dispense Refill   amLODipine (NORVASC) 10 MG tablet TAKE 1 (ONE) TABLET (10 MG TOTAL) BY MOUTH DAILY FOR BLOOD PRESSURE (Patient taking differently: Take 10 mg by mouth daily.) 90 tablet 1   Atogepant (QULIPTA) 60 MG TABS Take 60 mg by mouth daily. (Patient not taking: Reported on 09/27/2022) 30 tablet 11   Blood Pressure Monitor KIT 1 Bag by Does not apply route 3 (three) times daily as needed. 1 kit 0   budesonide (PULMICORT) 180 MCG/ACT inhaler Inhale 2 puffs into the lungs 2 (two) times daily. (Patient taking differently: Inhale 2 puffs into the lungs 2 (two) times daily as needed (for flares).) 1 each 5   cetirizine (ZYRTEC) 10 MG tablet Take 1 tablet (10 mg total) by mouth daily. (Patient taking differently: Take 10 mg by mouth daily as needed for allergies  or rhinitis.) 90 tablet 1   famotidine (PEPCID) 20 MG tablet TAKE 1 TABLET (20 MG TOTAL) BY MOUTH 2 (TWO) TIMES DAILY. (Patient taking differently: Take 20 mg by mouth daily before breakfast.) 30 tablet 0   fluticasone (FLONASE) 50 MCG/ACT nasal spray Place 1 spray into both nostrils daily. (Patient taking differently: Place 1 spray into both nostrils daily as needed for allergies or rhinitis.) 16 g 0   folic acid (FOLVITE) 1 MG tablet Take 1 tablet (1 mg total) by mouth daily. 30 tablet 3   gabapentin (NEURONTIN) 300 MG capsule Take 1 capsule (300 mg total) by mouth 3 (three) times daily. 90 capsule 0   lidocaine (LIDODERM) 5 % Place 1 patch onto the skin daily. Remove & Discard patch within 12 hours or as directed by MD (Patient taking differently: Place 1 patch onto the skin daily as needed (for pain- Remove & Discard patch within 12 hours or as directed by MD).) 30 patch 0   naltrexone (DEPADE) 50 MG tablet Take 1 tablet (50 mg total) by mouth daily. 30 tablet 1   Olopatadine HCl 0.2 % SOLN Place 1 drop into both eyes daily as needed (for itching).     ondansetron (ZOFRAN-ODT) 4 MG disintegrating tablet Take 1 tablet (4 mg total) by mouth every 8 (eight) hours as needed for nausea or vomiting. 20 tablet 0   RESTASIS 0.05 % ophthalmic emulsion PLACE 1 DROP INTO BOTH EYES 2 (TWO) TIMES DAILY. (Patient taking differently: Place 1  drop into both eyes 2 (two) times daily as needed (FOR DRYNESS).) 60 each 3   thiamine (VITAMIN B-1) 100 MG tablet Take 1 tablet (100 mg total) by mouth daily. 30 tablet 3   traZODone (DESYREL) 50 MG tablet Take 1 tablet (50 mg total) by mouth at bedtime. (Patient taking differently: Take 50 mg by mouth at bedtime as needed for sleep.) 30 tablet 3   TYLENOL 325 MG tablet Take 325-650 mg by mouth every 6 (six) hours as needed for mild pain or headache.     valsartan-hydrochlorothiazide (DIOVAN-HCT) 160-25 MG tablet Take 1 tablet by mouth daily. 90 tablet 3   VENTOLIN HFA 108  (90 Base) MCG/ACT inhaler INHALE 2 PUFFS INTO THE LUNGS EVERY 6 (SIX) HOURS AS NEEDED FOR WHEEZING OR SHORTNESS OF BREATH. 18 g 2   No current facility-administered medications on file prior to visit.     Review of System: Comprehensive ROS Pertinent positive and negative noted in HPI    Objective:  Blood Pressure 115/81   Pulse 80   Respiration 16   Weight 181 lb 9.6 oz (82.4 kg)   Oxygen Saturation 97%   Body Mass Index 28.44 kg/m   Filed Weights   10/05/22 1516  Weight: 181 lb 9.6 oz (82.4 kg)    Physical Exam: General Appearance: Well nourished, in no apparent distress. Eyes: PERRLA, EOMs, conjunctiva no swelling or erythema Sinuses: No Frontal/maxillary tenderness ENT/Mouth: Ext aud canals clear, TMs without erythema, bulging. No erythema, swelling, or exudate on post pharynx.  Tonsils not swollen or erythematous. Hearing normal.  Neck: Supple, thyroid normal.  Respiratory: Respiratory effort normal, BS equal bilaterally without rales, rhonchi, wheezing or stridor.  Cardio: RRR with no MRGs. Brisk peripheral pulses without edema.  Abdomen: Soft, + BS.  Non tender, no guarding, rebound, hernias, masses. Lymphatics: Non tender without lymphadenopathy.  Musculoskeletal: Full ROM, 5/5 strength, normal gait.  Skin: Warm, dry without rashes, lesions, ecchymosis.  Neuro: Cranial nerves intact. Normal muscle tone, no cerebellar symptoms. Sensation intact.  Psych: Awake and oriented X 3, normal affect, Insight and Judgment appropriate.    Assessment:  Jerita was seen today for hospitalization follow-up.  Diagnoses and all orders for this visit:  Hospital discharge follow-up Recommendations at discharge:  Follow up with PCP Juluis Mire in 1-2 weeks after episode of alcoholic hepatitis and atrial fibrillation Dr. Oletta Lamas:  Please check LFTs in 2 weeks on new naltrexone  If relapse on alcohol, please refer to cardiology for evaluation of atrial fibrillation   Insomnia  secondary to depression with anxiety Can try melatonin 5mg -15 mg at night for sleep, can also do benadryl 25-50mg  at night for sleep.  If this does not help we can try prescription medication.  Also here is some information about good sleep hygiene.  er.   Depression with anxiety   Row Labels 10/05/2022    3:14 PM 05/20/2022   10:04 AM 12/22/2021   10:34 AM 11/11/2021   10:17 AM 09/28/2021    2:20 PM  Depression screen PHQ 2/9   Section Header. No data exists in this row.       Decreased Interest   2 2 1 2  0  Down, Depressed, Hopeless   2 2 3 2 2   PHQ - 2 Score   4 4 4 4 2   Altered sleeping   3 2 3 2 3   Tired, decreased energy    3 3 3 2   Change in appetite   1 3 3  2 2  Feeling bad or failure about yourself    2 3 3 2 2   Trouble concentrating   3 3 3 3 2   Moving slowly or fidgety/restless    3 1 3 2   Suicidal thoughts   0 0 0 0 0  PHQ-9 Score   13 21 20 19 15   Difficult doing work/chores     Somewhat difficult Very difficult Very difficult   Refer to CSW- note written for an emotional support dog  Alcohol abuse Stopped at this time throwing up bleed made her stopped to think about her daughter if something was to happen to her . Advise AA or community support         This note has been created with Surveyor, quantity. Any transcriptional errors are unintentional.   [name], NP 10/05/2022, 3:25 PM

## 2022-10-06 ENCOUNTER — Ambulatory Visit: Payer: Medicaid Other | Admitting: Family Medicine

## 2022-10-14 ENCOUNTER — Encounter (HOSPITAL_COMMUNITY): Payer: Self-pay

## 2022-10-14 ENCOUNTER — Ambulatory Visit (HOSPITAL_COMMUNITY)
Admission: EM | Admit: 2022-10-14 | Discharge: 2022-10-14 | Disposition: A | Discharge status: Home / Self Care | Payer: Medicaid Other | Attending: Physician Assistant | Admitting: Physician Assistant

## 2022-10-14 DIAGNOSIS — H9202 Otalgia, left ear: Secondary | ICD-10-CM

## 2022-10-14 MED ORDER — AMOXICILLIN 500 MG PO CAPS: 500.0000 mg | ORAL_CAPSULE | Freq: Three times a day (TID) | ORAL | 0 refills | Status: DC

## 2022-10-14 NOTE — ED Triage Notes (Signed)
Pt c/o lt ear/head pain with swollen lymph nodes since yesterday. Denies taking any meds.

## 2022-10-14 NOTE — ED Provider Notes (Signed)
[facility]    CSN: [id number] Arrival date & time: 10/14/22  [id number]      History   Chief Complaint Chief Complaint  Patient presents with   Otalgia    HPI Robin Fischer is a 52 y.o. female.   Patient here today for evaluation of left ear and facial pain that started yesterday. She has not had any treatment for symptoms. She has not had any fever, sore throat, or cough.   The history is provided by the patient.  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   Excessive daytime sleepiness 04/19/2022   Pulmonary emphysema ([facility]) 04/19/2022   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[place]) 04/19/2022   Migraine without aura and without status migrainosus, not intractable 02/22/2022   Gross hematuria 09/24/2021   Tonsillopharyngitis 07/28/2020   Acute lower UTI 07/28/2020   Dehydration 05/04/2019   Pancreatitis 05/04/2019   Hypokalemia 08/20/2018   Tobacco abuse 08/20/2018   Leukocytosis 08/20/2018   Alcohol abuse [id number]   Alcoholic pancreatitis [id number]   Abnormal LFTs 08/20/2018   Acute pancreatitis 08/20/2018   Asthma    Hypertension     Past Surgical History:  Procedure Laterality Date   TRANSURETHRAL RESECTION OF BLADDER TUMOR WITH MITOMYCIN-C N/A 09/24/2021   Procedure: CYSTOSCOPY; CLOT EVACUATION; [place];  Surgeon: [name], MD;   Location: [facility];  Service: Urology;  Laterality: N/A;    OB History   No obstetric history on file.      Home Medications    Prior to Admission medications   Medication Sig Start Date End Date Taking? Authorizing Provider  amoxicillin (AMOXIL) 500 MG capsule Take 1 capsule (500 mg total) by mouth 3 (three) times daily. 10/14/22  Yes Francene Finders, PA-C  amLODipine (NORVASC) 10 MG tablet TAKE 1 (ONE) TABLET (10 MG TOTAL) BY MOUTH DAILY FOR BLOOD PRESSURE Patient taking differently: Take 10 mg by mouth daily. 05/26/22   [name], NP  Atogepant (QULIPTA) 60 MG TABS Take 60 mg by mouth daily. Patient not taking: Reported on 09/27/2022 02/22/22   Melvenia Beam, MD  Blood Pressure Monitor KIT 1 Bag by Does not apply route 3 (three) times daily as needed. 09/26/19   [name], NP  budesonide (PULMICORT) 180 MCG/ACT inhaler Inhale 2 puffs into the lungs 2 (two) times daily. Patient taking differently: Inhale 2 puffs into the lungs 2 (two) times daily as needed (for flares). 11/11/21   [name], NP  cetirizine (ZYRTEC) 10 MG tablet Take 1 tablet (10 mg total) by mouth daily. Patient taking differently: Take 10 mg by mouth daily as needed for allergies or rhinitis. 11/11/21   [name], NP  famotidine (PEPCID) 20 MG tablet TAKE 1 TABLET (20 MG TOTAL) BY MOUTH 2 (TWO) TIMES DAILY. Patient taking differently: Take 20 mg by mouth  daily before breakfast. 06/23/22   [name], NP  fluticasone (FLONASE) 50 MCG/ACT nasal spray Place 1 spray into both nostrils daily. Patient taking differently: Place 1 spray into both nostrils daily as needed for allergies or rhinitis. 05/26/22   [name], NP  folic acid (FOLVITE) 1 MG tablet Take 1 tablet (1 mg total) by mouth daily. 09/30/22   Danford, Suann Larry, MD  gabapentin (NEURONTIN) 300 MG capsule Take 1 capsule (300 mg total) by mouth 3 (three) times daily. 09/29/22 10/29/22  Danford, Suann Larry, MD   lidocaine (LIDODERM) 5 % Place 1 patch onto the skin daily. Remove & Discard patch within 12 hours or as directed by MD Patient taking differently: Place 1 patch onto the skin daily as needed (for pain- Remove & Discard patch within 12 hours or as directed by MD). 12/22/21   [name], NP  naltrexone (DEPADE) 50 MG tablet Take 1 tablet (50 mg total) by mouth daily. 09/29/22   Danford, Suann Larry, MD  Olopatadine HCl 0.2 % SOLN Place 1 drop into both eyes daily as needed (for itching). 04/03/20   [provider]  ondansetron (ZOFRAN-ODT) 4 MG disintegrating tablet Take 1 tablet (4 mg total) by mouth every 8 (eight) hours as needed for nausea or vomiting. 04/29/22   Elgie Congo, MD  RESTASIS 0.05 % ophthalmic emulsion PLACE 1 DROP INTO BOTH EYES 2 (TWO) TIMES DAILY. Patient taking differently: Place 1 drop into both eyes 2 (two) times daily as needed (FOR DRYNESS). 02/04/22   [name], NP  thiamine (VITAMIN B-1) 100 MG tablet Take 1 tablet (100 mg total) by mouth daily. 09/30/22   Danford, Suann Larry, MD  traZODone (DESYREL) 50 MG tablet Take 1 tablet (50 mg total) by mouth at bedtime. Patient taking differently: Take 50 mg by mouth at bedtime as needed for sleep. 04/19/22   Dohmeier, Asencion Partridge, MD  TYLENOL 325 MG tablet Take 325-650 mg by mouth every 6 (six) hours as needed for mild pain or headache.    [provider]  valsartan-hydrochlorothiazide (DIOVAN-HCT) 160-25 MG tablet Take 1 tablet by mouth daily. 05/26/22   [name], NP  VENTOLIN HFA 108 (90 Base) MCG/ACT inhaler INHALE 2 PUFFS INTO THE LUNGS EVERY 6 (SIX) HOURS AS NEEDED FOR WHEEZING OR SHORTNESS OF BREATH. 03/24/22   [name], NP    Family History Family History  Problem Relation Age of Onset   Hypertension Mother    Hyperlipidemia Mother    Hypertension Father    Hyperlipidemia Father    Migraines Neg Hx    Headache Neg Hx     Social History Social History    Tobacco Use   Smoking status: Every Day    Packs/day: 0.50    Types: Cigarettes   Smokeless tobacco: Never  Vaping Use   Vaping Use: Never used  Substance Use Topics   Alcohol use: Yes    Alcohol/week: 6.0 standard drinks of alcohol    Types: 2 Cans of beer, 4 Shots of liquor per week    Comment: beer   Drug use: No     Allergies   Patient has no known allergies.   Review of Systems Review of Systems  Constitutional:  Negative for chills and fever.  HENT:  Positive for congestion and ear pain. Negative for sore throat.   Eyes:  Negative for discharge and redness.  Respiratory:  Negative for cough and shortness of breath.   Gastrointestinal:  Negative for diarrhea, nausea and vomiting.     Physical Exam Triage Vital Signs ED Triage Vitals  Enc Vitals Group     BP 10/14/22 1011 110/72     Pulse Rate 10/14/22 1011 82     Resp 10/14/22 1011 18     Temp 10/14/22 1011 98.2 F (36.8 C)     Temp Source 10/14/22 1011 Oral     SpO2 10/14/22 1011 96 %     Weight --      Height --      Head Circumference --      Peak Flow --      Pain Score 10/14/22 1012 6     Pain Loc --      Pain Edu? --      Excl. in [place]? --    No data found.  Updated Vital Signs BP 110/72 (BP Location: Left Arm)   Pulse 82   Temp 98.2 F (36.8 C) (Oral)   Resp 18   SpO2 96%     Physical Exam Vitals and nursing note reviewed.  Constitutional:      General: She is not in acute distress.    Appearance: Normal appearance. She is not ill-appearing.  HENT:     Head: Normocephalic and atraumatic.     Right Ear: Tympanic membrane normal.     Ears:     Comments: Left TM dull, retracted    Nose: Congestion present.     Mouth/Throat:     Mouth: Mucous membranes are moist.     Pharynx: Oropharynx is clear. No oropharyngeal exudate or posterior oropharyngeal erythema.  Eyes:     Conjunctiva/sclera: Conjunctivae normal.  Cardiovascular:     Rate and Rhythm: Normal rate and regular rhythm.   Pulmonary:     Effort: Pulmonary effort is normal. No respiratory distress.     Breath sounds: Normal breath sounds. No wheezing, rhonchi or rales.  Neurological:     Mental Status: She is alert.      UC Treatments / Results  Labs (all labs ordered are listed, but only abnormal results are displayed) Labs Reviewed - No data to display  EKG   Radiology No results found.  Procedures Procedures (including critical care time)  Medications Ordered in UC Medications - No data to display  Initial Impression / Assessment and Plan / UC Course  I have reviewed the triage vital signs and the nursing notes.  Pertinent labs & imaging results that were available during my care of the patient were reviewed by me and considered in my medical decision making (see chart for details).    Will treat to cover possible developing otitis media and recommended flonase to treat any ETD. Encouraged follow up if no gradual improvement or with any further concerns.   Final Clinical Impressions(s) / UC Diagnoses   Final diagnoses:  Otalgia of left ear   Discharge Instructions   None    ED Prescriptions     Medication Sig Dispense Auth. Provider   amoxicillin (AMOXIL) 500 MG capsule Take 1 capsule (500 mg total) by mouth 3 (three) times daily. 21 capsule Francene Finders, PA-C      PDMP not reviewed this encounter.   Francene Finders, PA-C 10/14/22 1115

## 2022-10-24 ENCOUNTER — Ambulatory Visit: Payer: Medicaid Other

## 2022-10-24 ENCOUNTER — Telehealth: Payer: Self-pay

## 2022-10-24 NOTE — Progress Notes (Deleted)
No egg or soy allergy known to patient  No issues known to pt with past sedation with any surgeries or procedures Patient denies ever being told they had issues or difficulty with intubation  No FH of Malignant Hyperthermia Pt is not on diet pills Pt is not on  home 02  Pt is not on blood thinners  Pt denies issues with constipation  No A fib or A flutter Have any cardiac testing pending--*** Pt instructed to use Singlecare.[email or web address] or GoodRx for a price reduction on prep

## 2022-10-24 NOTE — Telephone Encounter (Signed)
Pt was here for her previsit appt, however, pv nurse had a long appt at the 1230 slot, I called pt at 1315 for her telephone appt and pt state she was in the waiting room (pt had telephone appt) so I let her know I will be with her in a few minutes bc my 6 pt had went long, then when I went out at 1320 to get pt she had left the waiting room, attempt to call pt several times and no answer, lm to pt to call back by 5pm today to r/s previsit or her colonoscopy will be cx'd .

## 2022-10-31 ENCOUNTER — Other Ambulatory Visit (INDEPENDENT_AMBULATORY_CARE_PROVIDER_SITE_OTHER): Payer: Self-pay | Admitting: Primary Care

## 2022-10-31 ENCOUNTER — Other Ambulatory Visit: Payer: Self-pay | Admitting: Neurology

## 2022-10-31 DIAGNOSIS — I10 Essential (primary) hypertension: Secondary | ICD-10-CM

## 2022-11-01 NOTE — Telephone Encounter (Signed)
Rx 05/26/22 #90 1RF- too soon Requested Prescriptions  Pending Prescriptions Disp Refills   amLODipine (NORVASC) 10 MG tablet [Pharmacy Med Name: AMLODIPINE BESYLATE 10 MG ORAL TABLET] 90 tablet 1    Sig: TAKE 1 (ONE) TABLET (10 MG TOTAL) BY MOUTH DAILY FOR BLOOD PRESSURE.     Cardiovascular: Calcium Channel Blockers 2 Passed - 10/31/2022  9:50 AM      Passed - Last BP in normal range    BP Readings from Last 1 Encounters:  10/14/22 110/72         Passed - Last Heart Rate in normal range    Pulse Readings from Last 1 Encounters:  10/14/22 82         Passed - Valid encounter within last 6 months    Recent Outpatient Visits           3 weeks ago Hospital discharge follow-up   [place], Michelle P, NP   5 months ago Screening for colon cancer   [place], Michelle P, NP   10 months ago Allergy, subsequent encounter   [place], Michelle P, NP   11 months ago Primary hypertension   [facility] [name], NP   1 year ago Screening for diabetes mellitus   [facility] [name], NP       Future Appointments             In 2 weeks Oletta Lamas, Milford Cage, NP [place]

## 2022-11-10 ENCOUNTER — Telehealth: Payer: Self-pay | Admitting: Licensed Clinical Social Worker

## 2022-11-10 NOTE — Telephone Encounter (Signed)
LCSWA called patient today to introduce herself and to assess patients' mental health needs. Patient was referred by PCP for Insomnia secondary to depression with anxiety Depression with anxiety. Stays in house a lot no unless she has to go out. LCSWA provided resources below. Counseling Resources   [email or web address]  [facility] [address] [place], [place] [address] [phone] or [phone] Walk-in urgent care 24/7 for anyone  For [facility] ONLY New patient assessments and therapy walk-ins: Monday and Wednesday 8am-11am First and second Friday 1pm-5pm New patient psychiatry and medication management walk-ins: Mondays, Wednesdays, Thursdays, Fridays 8am-11am No psychiatry walk-ins on Tuesday   *Accepts all insurance and uninsured for Urgent Care needs *Accepts Medicaid and uninsured for outpatient treatment   [facility] (Therapy and psychiatry) [facility] (near [place]) [address] [place], [place] [address] [phone] Fax: [phone] ([place])   [facility] [place],  [place]  [address] [phone] Call for appointment  [facility] (Therapy only)  [facility] [address] [place], [place] [address] Monday - Friday: 8:30 a.m.-12 p.m. / 1 p.m.-2:30 p.m.  [facility] [address], [place], [place] [address] Monday-Friday: 8:30 a.m.-12 p.m. / 2-3:30 p.m. (INSURANCE REQUIRED -MEDICAID ACCEPTED) They do offer a sliding fee scale $20-$30/session   [facility] [place], [place] [address] Phone: [employer] [address] [place] [address]  Phone: [phone] (Does not accept Medicaid) (only one provider accepts  Medicare)  [facility] [address]at [place], [place] [address] (Accepts Medicaid and Medicare)  [facility]) [place] [address] [place]  [place], [place] [address]  Phone: [phone]  [address] [place], [place] [address] Phone: [phone] [facility] Medicaid) [facility] (Therapy only)  [address], [place], [place] [address] Phone: [phone]   [facility] (Therapy only)  [place], [place] [address] Phone: [phone] [facility])   [place] [address] [place], [place] [address] Phone: [phone] ([place]) [employer] [phone] [address] [place], [place] [address] Phone: [phone] [facility]) [facility] (Psychiatry only)  [phone] [address], [place], [place] [address] (Accepts Medicaid and Medicare) [place] (Psychiatry and therapy)  [place], [place], [place] [address] [phone] [facility] Medicare) [place] (psychiatry and therapy) [address], [place], [place] [address] [phone]  [facility] at [address], [place], [place], [place] [address] [phone] Accept [address], [place], [place], [place], [place],  and the following types of Medicaid; [place], [place], [employer], [place], [place], [employer], Healthy [place], [place] Complete, and [place], as well as offering a Manufacturing systems engineer and private payment options. Provides In-Office Appointments, Virtual Appointments, and Phone Consultations Offers medication management for ages 85 years old and up, including,  Medication Management for Suboxone and [place] [phone] [address], [place], [place] [address] ([place] Medicaid and Medicare)          19.  [facility] (therapy only)  [address] [place], [place] [address]  [phone] (Accepts medicare) 20. [facility]  (Suboxone and methodone) [phone] [address] [place], [place] [address] To Be Eligible for Opioid Treatment at [facility] you must be at least 54 years of age you have already tried other interventions that were not successful such as opioid detox, inpatient rehab for opioids, or outpatient counseling specifically for opioid dependency your [facility] drug test must be completely free of benzodiazepines (klonopin, xanax, valium, ativan, or other benz) you have reliable transportation to the [facility] clinic in [place] you recognize that counseling is a critical component of [facility]' Opioid Program and you agree to attend all required counseling sessions you are committed to total drug abstinence and will conscientiously strive to remain free of alcohol, marijuana, and other illicit substances while in treatment you desire a peaceful treatment atmosphere in which personal responsibility and respect toward staff and clients is the norm   21. [facility] [place], [place], [place] [address] Offers SAIOP (Substance Abuse Intensive Outpatient Program) [phone] 22. [facility] [address] [place], [place] [address] [phone] (Accepts medicare)  For those who are tech savvy, go on psychology today, type in your local city (i.e. [place]) and specify your insurance at the top of the screen after you search. (Medicaid if needed). You can also specify whether you are interested in therapy and psychiatry.  [email or web address]

## 2022-11-15 ENCOUNTER — Encounter: Payer: Medicaid Other | Admitting: Gastroenterology

## 2022-11-16 ENCOUNTER — Encounter (INDEPENDENT_AMBULATORY_CARE_PROVIDER_SITE_OTHER): Payer: Self-pay

## 2022-11-16 ENCOUNTER — Ambulatory Visit (INDEPENDENT_AMBULATORY_CARE_PROVIDER_SITE_OTHER): Payer: Medicaid Other | Admitting: Primary Care

## 2022-11-16 ENCOUNTER — Encounter (INDEPENDENT_AMBULATORY_CARE_PROVIDER_SITE_OTHER): Payer: Self-pay | Admitting: Primary Care

## 2022-11-16 ENCOUNTER — Other Ambulatory Visit (HOSPITAL_COMMUNITY)
Admission: RE | Admit: 2022-11-16 | Discharge: 2022-11-16 | Disposition: A | Discharge status: Home / Self Care | Payer: Medicaid Other | Source: Ambulatory Visit | Attending: Primary Care | Admitting: Primary Care

## 2022-11-16 VITALS — BP 121/78 | HR 97 | Resp 16 | Wt 181.8 lb

## 2022-11-16 DIAGNOSIS — M79672 Pain in left foot: Secondary | ICD-10-CM

## 2022-11-16 DIAGNOSIS — T7840XD Allergy, unspecified, subsequent encounter: Secondary | ICD-10-CM | POA: Diagnosis not present

## 2022-11-16 DIAGNOSIS — I10 Essential (primary) hypertension: Secondary | ICD-10-CM

## 2022-11-16 DIAGNOSIS — N898 Other specified noninflammatory disorders of vagina: Secondary | ICD-10-CM | POA: Insufficient documentation

## 2022-11-16 DIAGNOSIS — Z1211 Encounter for screening for malignant neoplasm of colon: Secondary | ICD-10-CM

## 2022-11-16 MED ORDER — FLUTICASONE PROPIONATE 50 MCG/ACT NA SUSP: 1.0000 | Freq: Every day | NASAL | 0 refills | Status: DC

## 2022-11-16 MED ORDER — VALSARTAN-HYDROCHLOROTHIAZIDE 160-25 MG PO TABS: 1.0000 | ORAL_TABLET | Freq: Every day | ORAL | 3 refills | Status: DC

## 2022-11-16 MED ORDER — RESTASIS 0.05 % OP EMUL: 1.0000 [drp] | Freq: Two times a day (BID) | OPHTHALMIC | 3 refills | Status: AC

## 2022-11-16 MED ORDER — CETIRIZINE HCL 10 MG PO TABS: 10.0000 mg | ORAL_TABLET | Freq: Every day | ORAL | 1 refills | Status: DC | PRN

## 2022-11-16 MED ORDER — BUDESONIDE 180 MCG/ACT IN AEPB: 2.0000 | INHALATION_SPRAY | Freq: Two times a day (BID) | RESPIRATORY_TRACT | 5 refills | Status: DC

## 2022-11-16 MED ORDER — AMLODIPINE BESYLATE 5 MG PO TABS: 5.0000 mg | ORAL_TABLET | Freq: Every day | ORAL | 1 refills | Status: DC

## 2022-11-16 NOTE — Progress Notes (Signed)
    [facility]   Subjective:     Ms. Shaniqwa Horsman is a 52 y.o. female who presents for evaluation of sinus pain. Symptoms include: congestion, itchy eyes, itchy nose, post nasal drip, sneezing, and sore throat. Onset of symptoms was 1 week ago. Symptoms have been rapidly worsening since that time. Past history is significant for asthma, chronic bronchitis, and emphysema. Patient is a smoker  (1/2 ppd x 30 yrs). Patient has No headache, No chest pain, No abdominal pain - No Nausea, No new weakness tingling or numbness, No Cough - shortness of breath   The following portions of the patient's history were reviewed and updated as appropriate: She  has a past medical history of Asthma, Bronchitis, GERD (gastroesophageal reflux disease), High cholesterol, Hypertension, Pancreatitis, and Tobacco abuse..  Review of Systems Pertinent items are noted in HPI.   Objective:  Blood Pressure 97/65 (BP Location: Right Arm, Patient Position: Sitting, Cuff Size: Normal)   Pulse 97   Respiration 16   Weight 181 lb 12.8 oz (82.5 kg)   Oxygen Saturation 99%   Body Mass Index 28.47 kg/m     Assessment:  Faith was seen today for sinus problem.  Diagnoses and all orders for this visit:  Vaginal irritation Per patient request refer to gyn from [place] are painful -     Ambulatory referral to Obstetrics / Gynecology  Primary hypertension -     Discontinue: valsartan-hydrochlorothiazide (DIOVAN-HCT) 160-25 MG tablet; Take 1 tablet by mouth daily. -     valsartan-hydrochlorothiazide (DIOVAN-HCT) 160-25 MG tablet; Take 1 tablet by mouth daily.  Allergy, subsequent encounter -     RESTASIS 0.05 % ophthalmic emulsion; Place 1 drop into both eyes 2 (two) times daily.  Left foot pain -     Ambulatory referral to Podiatry  Other orders -     amLODipine (NORVASC) 5 MG tablet; Take 1 tablet (5 mg total) by mouth daily. -     budesonide (PULMICORT) 180 MCG/ACT inhaler; Inhale 2  puffs into the lungs 2 (two) times daily. -     cetirizine (ZYRTEC) 10 MG tablet; Take 1 tablet (10 mg total) by mouth daily as needed for allergies or rhinitis. -     fluticasone (FLONASE) 50 MCG/ACT nasal spray; Place 1 spray into both nostrils daily.    This note has been created with Surveyor, quantity. Any transcriptional errors are unintentional.   [name], NP 11/16/2022, 1:06 PM

## 2022-11-18 LAB — CERVICOVAGINAL ANCILLARY ONLY
Bacterial Vaginitis (gardnerella): POSITIVE — AB
Candida Glabrata: NEGATIVE
Candida Vaginitis: NEGATIVE
Chlamydia: NEGATIVE
Comment: NEGATIVE
Comment: NEGATIVE
Comment: NEGATIVE
Comment: NEGATIVE
Comment: NEGATIVE
Comment: NORMAL
Neisseria Gonorrhea: NEGATIVE
Trichomonas: NEGATIVE

## 2022-11-20 ENCOUNTER — Other Ambulatory Visit (INDEPENDENT_AMBULATORY_CARE_PROVIDER_SITE_OTHER): Payer: Self-pay | Admitting: Primary Care

## 2022-11-20 DIAGNOSIS — B9689 Other specified bacterial agents as the cause of diseases classified elsewhere: Secondary | ICD-10-CM

## 2022-11-20 MED ORDER — METRONIDAZOLE 500 MG PO TABS: 500.0000 mg | ORAL_TABLET | Freq: Two times a day (BID) | ORAL | 0 refills | Status: DC

## 2022-11-22 ENCOUNTER — Ambulatory Visit: Payer: Medicaid Other | Admitting: Podiatry

## 2022-11-22 ENCOUNTER — Ambulatory Visit (INDEPENDENT_AMBULATORY_CARE_PROVIDER_SITE_OTHER): Payer: Medicaid Other

## 2022-11-22 DIAGNOSIS — M7752 Other enthesopathy of left foot: Secondary | ICD-10-CM

## 2022-11-22 DIAGNOSIS — M79672 Pain in left foot: Secondary | ICD-10-CM | POA: Diagnosis not present

## 2022-11-22 NOTE — Patient Instructions (Addendum)
You can use biofreeze, Aspercreme with lidocaine.   While at your visit today you received a steroid injection in your foot or ankle to help with your pain. Along with having the steroid medication there is some "numbing" medication in the shot that you received. Due to this you may notice some numbness to the area for the next couple of hours.   I would recommend limiting activity for the next few days to help the steroid injection take affect.    The actually benefit from the steroid injection may take up to 2-7 days to see a difference. You may actually experience a small (as in 10%) INCREASE in pain in the first 24 hours---that is common. It would be best if you can ice the area today and take anti-inflammatory medications (such as Ibuprofen, Motrin, or Aleve) if you are able to take these medications. If you were prescribed another medication to help with the pain go ahead and start that medication today    Things to watch out for that you should contact us or a health care provider urgently would include: 1. Unusual (as in more than 10%) increase in pain 2. New fever > 101.5 3. New swelling or redness of the injected area.  4. Streaking of red lines around the area injected.  If you have any questions or concerns about this, please give our office a call at [phone].

## 2022-11-22 NOTE — Progress Notes (Signed)
Subjective:   Patient ID: Robin Fischer, female   DOB: 52 y.o.   MRN: [id number]   HPI Chief Complaint  Patient presents with   Foot Pain    Left foot, pain located on the midfoot, pain has been going on for awhile and progressively getting worse    52 year old female presents the office for above concerns.  She states that pain has been ongoing for several years but is recently getting worse.  She tried changing shoes.  She noticed a bone spur sticking out more.  No recent treatment.  No injuries.  No numbness or tingling.  No radiating pain.  No other concerns.    Review of Systems  All other systems reviewed and are negative.  Past Medical History:  Diagnosis Date   Asthma    Bronchitis    GERD (gastroesophageal reflux disease)    High cholesterol    Hypertension    Pancreatitis    Tobacco abuse     Past Surgical History:  Procedure Laterality Date   TRANSURETHRAL RESECTION OF BLADDER TUMOR WITH MITOMYCIN-C N/A 09/24/2021   Procedure: CYSTOSCOPY; CLOT EVACUATION; FULGERATION OF URETHRA;  Surgeon: [name], MD;  Location: [facility];  Service: Urology;  Laterality: N/A;     Current Outpatient Medications:    amLODipine (NORVASC) 5 MG tablet, Take 1 tablet (5 mg total) by mouth daily., Disp: 90 tablet, Rfl: 1   Blood Pressure Monitor KIT, 1 Bag by Does not apply route 3 (three) times daily as needed., Disp: 1 kit, Rfl: 0   budesonide (PULMICORT) 180 MCG/ACT inhaler, Inhale 2 puffs into the lungs 2 (two) times daily., Disp: 1 each, Rfl: 5   cetirizine (ZYRTEC) 10 MG tablet, Take 1 tablet (10 mg total) by mouth daily as needed for allergies or rhinitis., Disp: 90 tablet, Rfl: 1   famotidine (PEPCID) 20 MG tablet, TAKE 1 TABLET (20 MG TOTAL) BY MOUTH 2 (TWO) TIMES DAILY. (Patient taking differently: Take 20 mg by mouth daily before breakfast.), Disp: 30 tablet, Rfl: 0   fluticasone (FLONASE) 50 MCG/ACT nasal spray, Place 1 spray into both nostrils daily., Disp: 16 g, Rfl: 0    folic acid (FOLVITE) 1 MG tablet, Take 1 tablet (1 mg total) by mouth daily., Disp: 30 tablet, Rfl: 3   lidocaine (LIDODERM) 5 %, Place 1 patch onto the skin daily. Remove & Discard patch within 12 hours or as directed by MD (Patient taking differently: Place 1 patch onto the skin daily as needed (for pain- Remove & Discard patch within 12 hours or as directed by MD).), Disp: 30 patch, Rfl: 0   metroNIDAZOLE (FLAGYL) 500 MG tablet, Take 1 tablet (500 mg total) by mouth 2 (two) times daily., Disp: 14 tablet, Rfl: 0   naltrexone (DEPADE) 50 MG tablet, Take 1 tablet (50 mg total) by mouth daily., Disp: 30 tablet, Rfl: 1   Olopatadine HCl 0.2 % SOLN, Place 1 drop into both eyes daily as needed (for itching)., Disp: , Rfl:    ondansetron (ZOFRAN-ODT) 4 MG disintegrating tablet, Take 1 tablet (4 mg total) by mouth every 8 (eight) hours as needed for nausea or vomiting., Disp: 20 tablet, Rfl: 0   RESTASIS 0.05 % ophthalmic emulsion, Place 1 drop into both eyes 2 (two) times daily., Disp: 60 each, Rfl: 3   thiamine (VITAMIN B-1) 100 MG tablet, Take 1 tablet (100 mg total) by mouth daily., Disp: 30 tablet, Rfl: 3   thiamine (VITAMIN B1) 100 MG tablet, Take 100 mg  by mouth daily., Disp: , Rfl:    traZODone (DESYREL) 50 MG tablet, TAKE 1 TABLET (50 MG TOTAL) BY MOUTH AT BEDTIME., Disp: 30 tablet, Rfl: 3   TYLENOL 325 MG tablet, Take 325-650 mg by mouth every 6 (six) hours as needed for mild pain or headache., Disp: , Rfl:    valsartan-hydrochlorothiazide (DIOVAN-HCT) 160-25 MG tablet, Take 1 tablet by mouth daily., Disp: 90 tablet, Rfl: 3   VENTOLIN HFA 108 (90 Base) MCG/ACT inhaler, INHALE 2 PUFFS INTO THE LUNGS EVERY 6 (SIX) HOURS AS NEEDED FOR WHEEZING OR SHORTNESS OF BREATH., Disp: 18 g, Rfl: 2   gabapentin (NEURONTIN) 300 MG capsule, Take 1 capsule (300 mg total) by mouth 3 (three) times daily., Disp: 90 capsule, Rfl: 0  No Known Allergies         Objective:  Physical Exam  General: AAO x3,  NAD  Dermatological: Skin is warm, dry and supple bilateral. There are no open sores, no preulcerative lesions, no rash or signs of infection present.  Vascular: Dorsalis Pedis artery and Posterior Tibial artery pedal pulses are 2/4 bilateral with immedate capillary fill time.  There is no pain with calf compression, swelling, warmth, erythema.   Neruologic: Grossly intact via light touch bilateral.  Musculoskeletal: On the dorsal aspect the left midfoot is a prominent bone spur with tenderness palpation localized edema.  There is no surrounding erythema, ascending cellulitis.  No drainage or pus.  Gait: Unassisted, Nonantalgic.       Assessment:   52 year old female with bone spur, capsulitis left foot     Plan:  -Treatment options discussed including all alternatives, risks, and complications -Etiology of symptoms were discussed -X-rays were obtained and reviewed with the patient.  3 views of the left foot were obtained.  On lateral view dorsal spurring noted. No evidence of acute fracture noted. -We discussed the conservative as well as surgical treatment options.  Will start with conservative treatment.  Discussed offloading I dispensed moleskin as well as revising her shoes discussed to help offload.  Discussed topical medications.  Steroid injection was performed today following verbal consent.  Injection performed along the midfoot, intermediate joint.  Skin was cleaned with alcohol and mixture quarter cc dexamethasone phosphate, quarter cc Marcaine plain was infiltrated into the area of tenderness without complications.  Postinjection care discussed.  Tolerated well.  Trula Slade DPM

## 2022-12-02 ENCOUNTER — Ambulatory Visit
Admission: RE | Admit: 2022-12-02 | Discharge: 2022-12-02 | Disposition: A | Discharge status: Home / Self Care | Payer: Medicaid Other | Source: Ambulatory Visit | Attending: Primary Care | Admitting: Primary Care

## 2022-12-02 DIAGNOSIS — Z139 Encounter for screening, unspecified: Secondary | ICD-10-CM

## 2022-12-06 ENCOUNTER — Other Ambulatory Visit: Payer: Self-pay | Admitting: Primary Care

## 2022-12-06 DIAGNOSIS — R928 Other abnormal and inconclusive findings on diagnostic imaging of breast: Secondary | ICD-10-CM

## 2022-12-07 ENCOUNTER — Ambulatory Visit (INDEPENDENT_AMBULATORY_CARE_PROVIDER_SITE_OTHER): Payer: Medicaid Other

## 2023-01-15 IMAGING — CT CT RENAL STONE PROTOCOL
2 of 4 series · 16 of 46 positions shown, 18 images · non-contrast
Comparison: 05/04/2019

CLINICAL DATA: Hematuria for 2 days, urinary tract infection



[Series 2: axial st · axial · 0.74mm/px · z∈[+1091,+1471]mm · 13 of 88 slices shown, 15 images]
[im 6/88  soft-tissue]
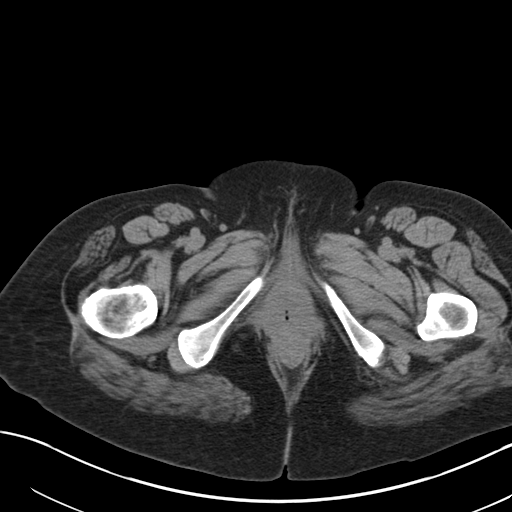
[im 6/88  bone]
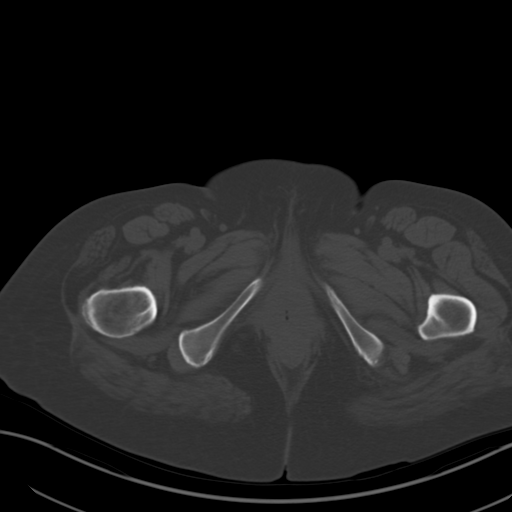
[im 11/88  soft-tissue]
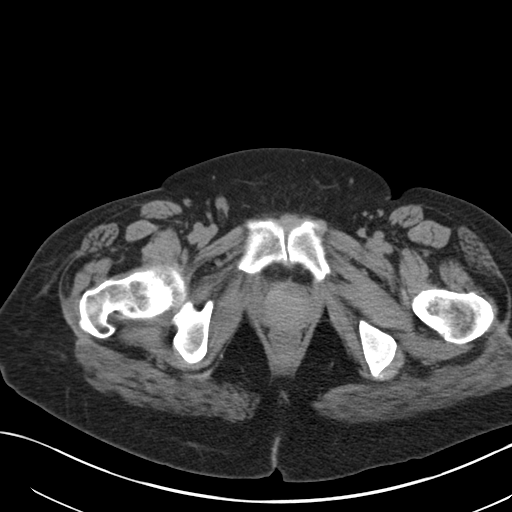
[im 21/88  soft-tissue]
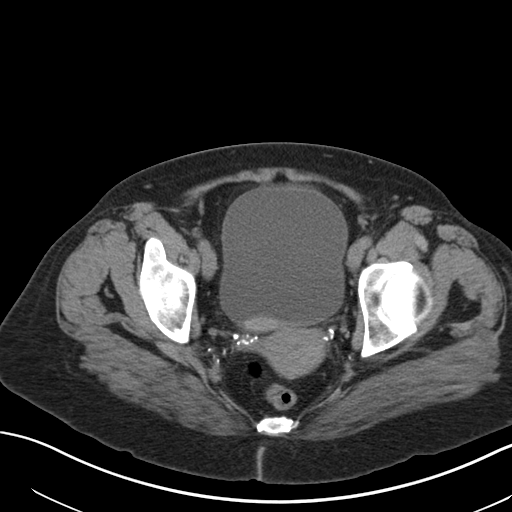
[im 26/88  soft-tissue]
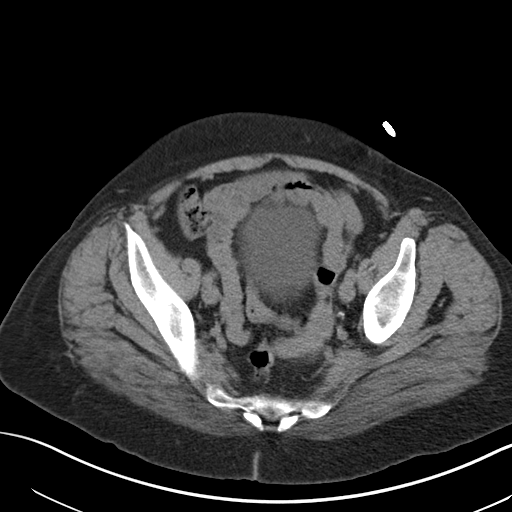
[im 31/88  soft-tissue]
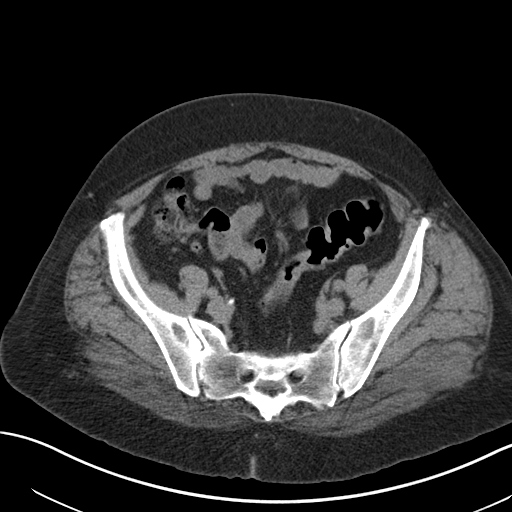
[im 36/88  soft-tissue]
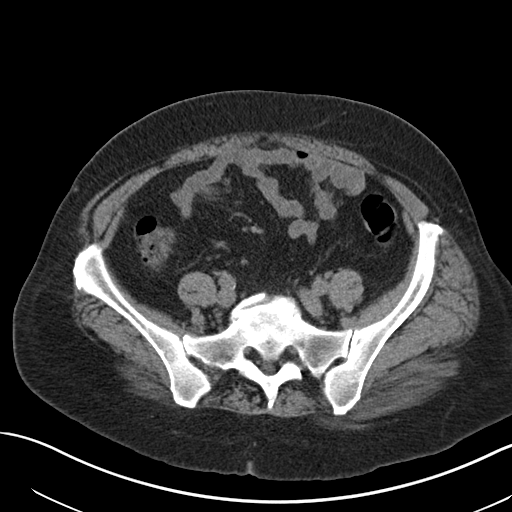
[im 47/88  soft-tissue]
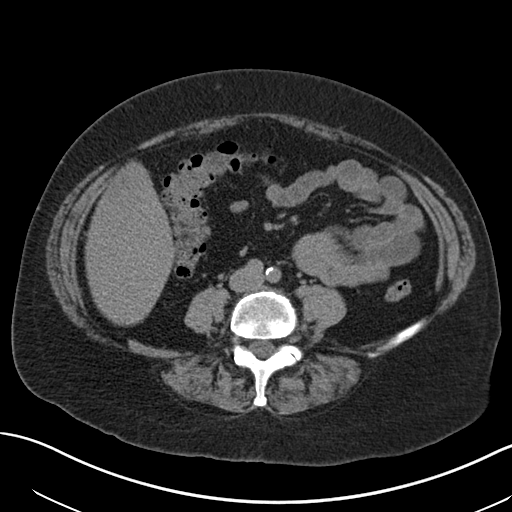
[im 52/88  soft-tissue]
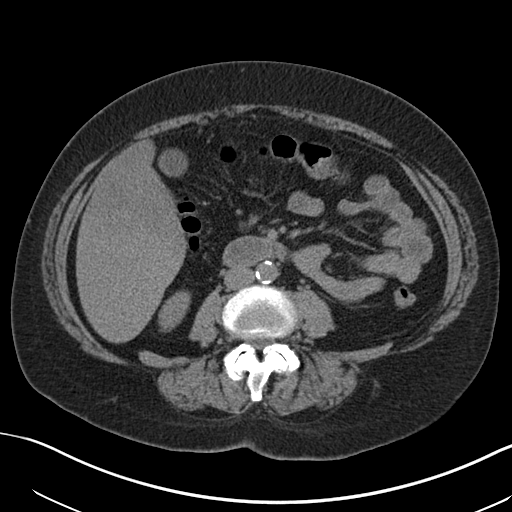
[im 57/88  soft-tissue]
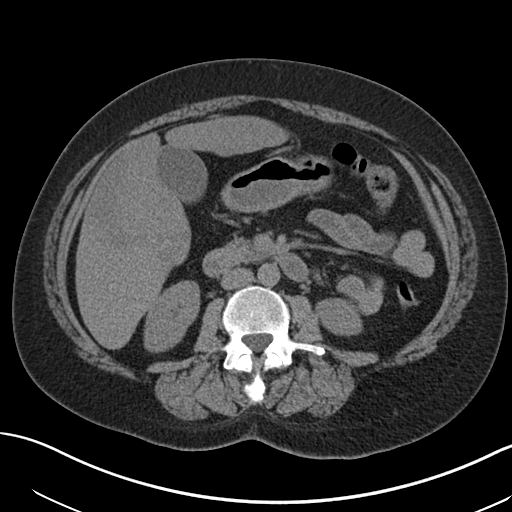
[im 57/88  bone]
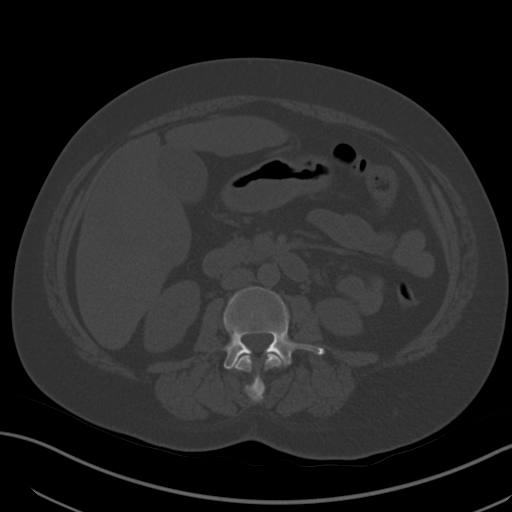
[im 62/88  soft-tissue]
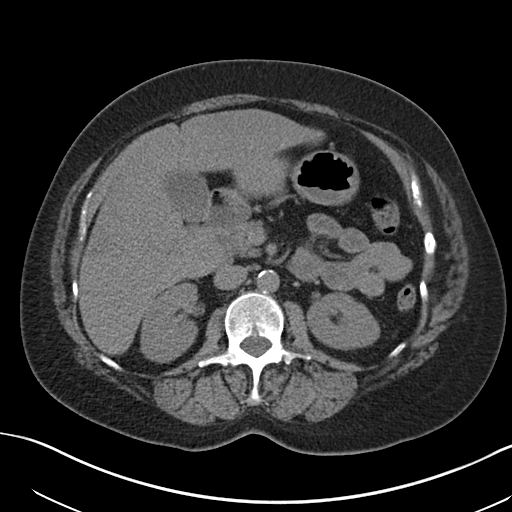
[im 67/88  soft-tissue]
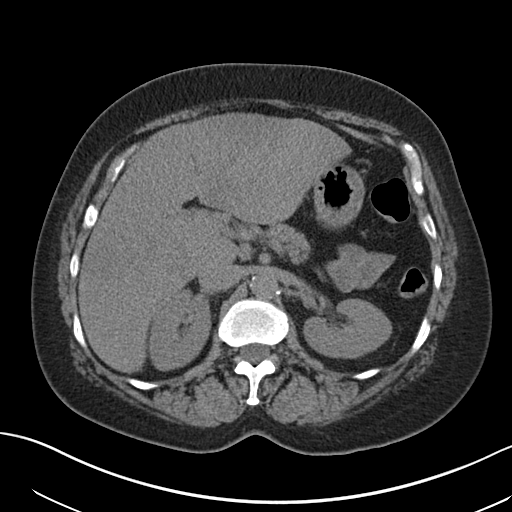
[im 77/88  soft-tissue]
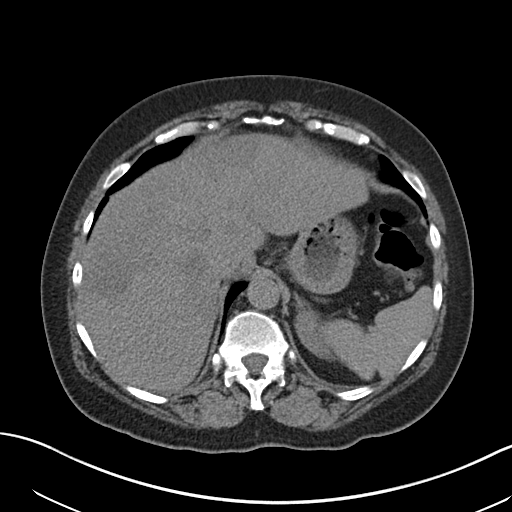
[im 82/88  soft-tissue]
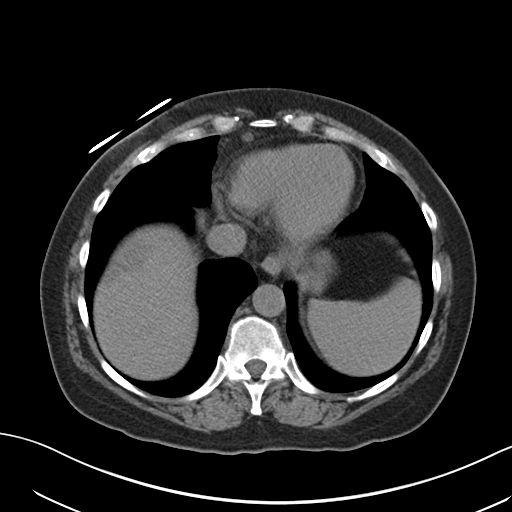

[Series 5: coronal · coronal · 0.87mm/px · 3 of 125 slices shown]
[im 42/125  soft-tissue]
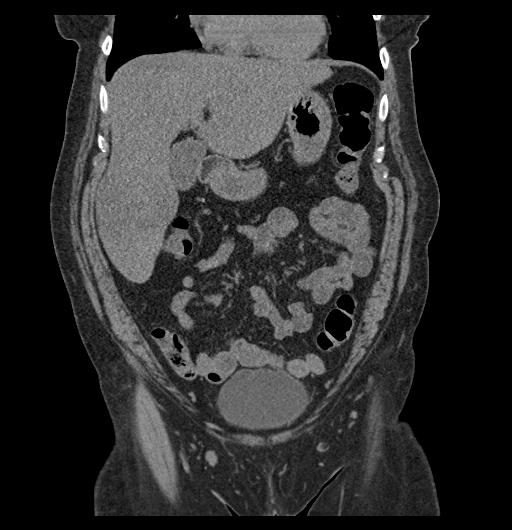
[im 56/125  soft-tissue]
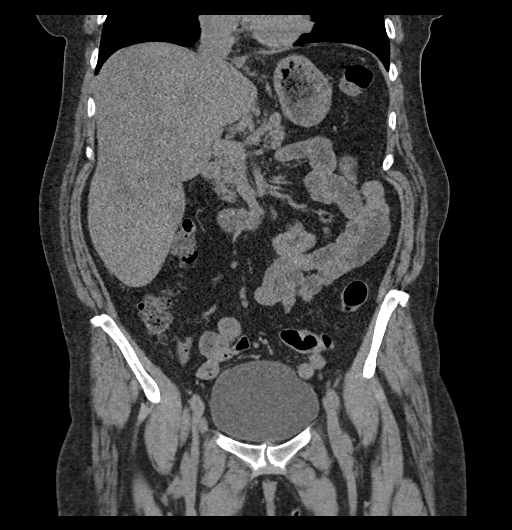
[im 69/125  soft-tissue]
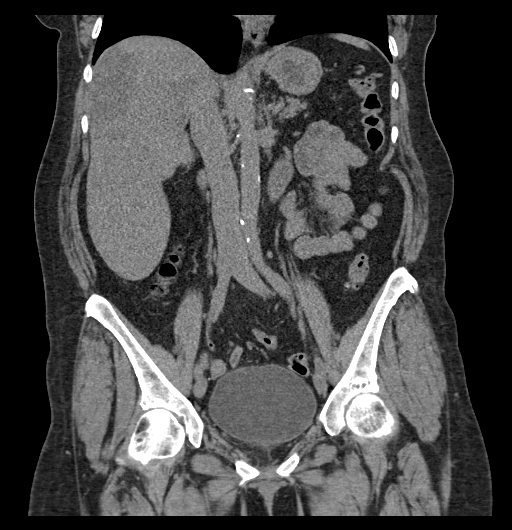

[16 of 46 positions shown; findings below may reference images not displayed]

FINDINGS: Lower chest: No acute pleural or parenchymal lung disease.

Hepatobiliary: Large geographic areas of decreased attenuation
throughout the liver parenchyma are nonspecific on this unenhanced
exam, but likely reflect hepatic steatosis. No intrahepatic duct
dilation. The gallbladder is unremarkable.

Pancreas: Unremarkable unenhanced appearance.

Spleen: Unremarkable unenhanced appearance.

Adrenals/Urinary Tract: No urinary tract calculi or obstructive
uropathy within either kidney.

The bladder is moderately distended. High attenuation material is
seen posteriorly along the bladder wall, measuring up to 1 cm in
thickness. Given clinical history of gross hematuria, this could
reflect blood clot within the bladder lumen. Underlying mucosal
lesion cannot be excluded, and cystoscopy is recommended.

There is a large urethral diverticulum identified measuring 3.5 x
3.1 cm, with similar high attenuation as the material in the bladder
lumen. Again, while this likely reflects blood products, underlying
mass cannot be excluded.

The adrenals are unremarkable.

Stomach/Bowel: No bowel obstruction or ileus. Normal appendix right
lower quadrant. No bowel wall thickening or inflammatory change.

Vascular/Lymphatic: Aortic atherosclerosis. No enlarged abdominal or
pelvic lymph nodes.

Reproductive: Uterus and bilateral adnexa are unremarkable.

Other: No free fluid or free intraperitoneal gas. No abdominal wall
hernia.

Musculoskeletal: No acute or destructive bony lesions. Reconstructed
images demonstrate no additional findings.
IMPRESSION: 1. High attenuation material within the posterior aspect of the
bladder, likely representing blood products layering dependently.
Underlying mucosal lesion cannot be excluded. Cystoscopy is
recommended.
2. Large urethrocele again noted, now with high attenuation material
similar to the bladder lumen compatible with blood products.
Urologic consultation may be useful.
3. No urinary tract calculi or obstructive uropathy.
4. Geographic decreased attenuation of the liver parenchyma,
nonspecific on this unenhanced exam but likely reflecting hepatic
steatosis.
5.  Aortic Atherosclerosis ([id number]).

## 2023-01-26 ENCOUNTER — Telehealth (INDEPENDENT_AMBULATORY_CARE_PROVIDER_SITE_OTHER): Payer: Self-pay | Admitting: Primary Care

## 2023-01-26 NOTE — Telephone Encounter (Signed)
Pt is calling to speak to [place]. Advised that it is personal. Please advise CB- [phone]

## 2023-01-27 NOTE — Telephone Encounter (Signed)
Will forward to provider  

## 2023-01-27 NOTE — Telephone Encounter (Signed)
Call patient back upset because [facility] keep calling her when she went they tried to get her to establish care will not repeat what she told them and left. She has a PCP and not changing

## 2023-02-02 ENCOUNTER — Ambulatory Visit (INDEPENDENT_AMBULATORY_CARE_PROVIDER_SITE_OTHER): Payer: Self-pay

## 2023-02-02 NOTE — Telephone Encounter (Signed)
     Chief Complaint: Right knee pain, ankle pain. 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 Symptoms: Pain 10/10 Frequency: 2 weeks ago Pertinent Negatives: Patient denies  Disposition: [] ED /[] Urgent Care (no appt availability in office) / [] Appointment(In office/virtual)/ []  [place] [facility]/ [] Home Care/ [] Refused Recommended Disposition /[] [place] [facility]/ [x]  Follow-up with PCP Additional Notes: Please advise pt.  Answer Assessment - Initial Assessment Questions 1. LOCATION and RADIATION: "Where is the pain located?"      Right knee and foot 2. QUALITY: "What does the pain feel like?"  (e.g., sharp, dull, aching, burning)     Sharp 3. SEVERITY: "How bad is the pain?" "What does it keep you from doing?"   (Scale 1-10; or mild, moderate, severe)   -  MILD (1-3): doesn't interfere with normal activities    -  MODERATE (4-7): interferes with normal activities (e.g., work or school) or awakens from sleep, limping    -  SEVERE (8-10): excruciating pain, unable to do any normal activities, unable to walk     10 4. ONSET: "When did the pain start?" "Does it come and go, or is it there all the time?"     2 weeks 5. RECURRENT: "Have you had this pain before?" If Yes, ask: "When, and what happened then?"     No 6. SETTING: "Has there been any recent work, exercise or other activity that involved that part of the body?"      No 7. AGGRAVATING FACTORS: "What makes the knee pain worse?" (e.g., walking, climbing stairs, running)     Walking 8. ASSOCIATED SYMPTOMS: "Is there any swelling or redness of the knee?"     Swelling sometimes 9. OTHER SYMPTOMS: "Do you have any other symptoms?" (e.g., chest pain, difficulty breathing, fever, calf pain)     No 10. PREGNANCY: "Is there any chance you are pregnant?" "When was your last menstrual period?"       No  Protocols used: Knee Pain-A-AH

## 2023-02-06 ENCOUNTER — Ambulatory Visit (INDEPENDENT_AMBULATORY_CARE_PROVIDER_SITE_OTHER): Payer: 59 | Admitting: Primary Care

## 2023-02-06 ENCOUNTER — Encounter (INDEPENDENT_AMBULATORY_CARE_PROVIDER_SITE_OTHER): Payer: Self-pay | Admitting: Primary Care

## 2023-02-06 VITALS — BP 108/66 | HR 81 | Temp 97.8°F | Resp 16 | Wt 192.0 lb

## 2023-02-06 DIAGNOSIS — M25461 Effusion, right knee: Secondary | ICD-10-CM

## 2023-02-06 DIAGNOSIS — M25561 Pain in right knee: Secondary | ICD-10-CM

## 2023-02-06 DIAGNOSIS — I10 Essential (primary) hypertension: Secondary | ICD-10-CM | POA: Diagnosis not present

## 2023-02-06 DIAGNOSIS — M545 Low back pain, unspecified: Secondary | ICD-10-CM

## 2023-02-06 MED ORDER — FOLIC ACID 1 MG PO TABS: 1.0000 mg | ORAL_TABLET | Freq: Every day | ORAL | 3 refills | Status: DC

## 2023-02-06 MED ORDER — LIDOCAINE 5 % EX PTCH: 1.0000 | MEDICATED_PATCH | CUTANEOUS | 0 refills | Status: DC

## 2023-02-06 MED ORDER — PREDNISONE 10 MG (21) PO TBPK: ORAL_TABLET | ORAL | 0 refills | Status: DC

## 2023-02-06 MED ORDER — AMLODIPINE BESYLATE 5 MG PO TABS: 5.0000 mg | ORAL_TABLET | Freq: Every day | ORAL | 1 refills | Status: DC

## 2023-02-06 NOTE — Progress Notes (Signed)
   Acute Office Visit  Subjective:     Patient ID: Robin Fischer, female    DOB: Nov 21, 1970, 52 y.o.   MRN: [id number]  Chief Complaint  Patient presents with   Leg Pain    HPI [name] is a 52 year old  female who presents for evaluation right knee , swelling and pain.Aggraving factors changing position or try to sleep. Symptoms has been present for over a month and worsening.  Right leg pain x 2 -3 weeks ,Sharp pain from foot to knee, Swelling to right knee. Feeling that something is pulling. Pain 9/10 Patient has No headache, No chest pain, No abdominal pain - No Nausea, No new weakness tingling or numbness, No Cough - shortness of breath  ROS Comprehensive ROS Pertinent positive and negative noted in HPI       Objective:    Blood Pressure 108/66 (BP Location: Left Arm, Patient Position: Sitting, Cuff Size: Normal)   Pulse 81   Temperature 97.8 F (36.6 C) (Oral)   Respiration 16   Weight 192 lb (87.1 kg)   Oxygen Saturation 98%   Body Mass Index 30.07 kg/m    Physical Exam Constitutional:      Appearance: She is obese.  HENT:     Head: Normocephalic.     Right Ear: Tympanic membrane and external ear normal.     Left Ear: Tympanic membrane and external ear normal.     Nose: Nose normal.  Cardiovascular:     Rate and Rhythm: Normal rate and regular rhythm.  Pulmonary:     Effort: Pulmonary effort is normal.     Breath sounds: Normal breath sounds.  Abdominal:     General: Bowel sounds are normal.     Palpations: Abdomen is soft.  Musculoskeletal:        General: Normal range of motion.     Cervical back: Normal range of motion.  Skin:    General: Skin is warm and dry.  Neurological:     Mental Status: She is alert and oriented to person, place, and time.  Psychiatric:        Mood and Affect: Mood normal.        Behavior: Behavior normal.   No results found for any visits on 02/06/23.      Assessment & Plan:  Isabeau was seen today for leg  pain.  Diagnoses and all orders for this visit:  Pain and swelling of right knee Gout vs bursitis -    Uric Acid  -     predniSONE (STERAPRED UNI-PAK 21 TAB) 10 MG (21) TBPK tablet; Day 1 take 6 pills by mouth once daily; Day 2 take 5 pills once daily; Day 3 take 4 pills once daily; Day 4 take 3 pills once daily; Day 5 take 2 tablets once daily; Day 6 take 1 tablet daily -    Back pain at L4-L5 level   lidocaine (LIDODERM) 5 %; Place 1 patch onto the skin daily. Remove & Discard patch within 12 hours or as directed by MD  Primary hypertension -     amLODipine (NORVASC) 5 MG tablet; Take 1 tablet (5 mg total) by mouth daily.  Other orders -     folic acid (FOLVITE) 1 MG tablet; Take 1 tablet (1 mg total) by mouth daily.    Grayce Sessions, NP

## 2023-02-06 NOTE — Progress Notes (Signed)
Medication refill    Right leg pain x 2 -3 weeks  Sharp pain from foot to knee Swelling to right knee. Feeling that something is pulling.  Pain 9/10   Did not take BP medication today

## 2023-02-07 LAB — URIC ACID: Uric Acid: 7.6 mg/dL — ABNORMAL HIGH (ref 3.0–7.2)

## 2023-03-14 DIAGNOSIS — Z8249 Family history of ischemic heart disease and other diseases of the circulatory system: Secondary | ICD-10-CM | POA: Diagnosis not present

## 2023-03-14 DIAGNOSIS — Z809 Family history of malignant neoplasm, unspecified: Secondary | ICD-10-CM | POA: Diagnosis not present

## 2023-03-14 DIAGNOSIS — J45909 Unspecified asthma, uncomplicated: Secondary | ICD-10-CM | POA: Diagnosis not present

## 2023-03-14 DIAGNOSIS — I10 Essential (primary) hypertension: Secondary | ICD-10-CM | POA: Diagnosis not present

## 2023-03-14 DIAGNOSIS — Z833 Family history of diabetes mellitus: Secondary | ICD-10-CM | POA: Diagnosis not present

## 2023-03-14 DIAGNOSIS — Z720 Tobacco use: Secondary | ICD-10-CM | POA: Diagnosis not present

## 2023-03-14 DIAGNOSIS — F32A Depression, unspecified: Secondary | ICD-10-CM | POA: Diagnosis not present

## 2023-03-14 DIAGNOSIS — Z973 Presence of spectacles and contact lenses: Secondary | ICD-10-CM | POA: Diagnosis not present

## 2023-03-14 DIAGNOSIS — Z6836 Body mass index (BMI) 36.0-36.9, adult: Secondary | ICD-10-CM | POA: Diagnosis not present

## 2023-03-14 DIAGNOSIS — G629 Polyneuropathy, unspecified: Secondary | ICD-10-CM | POA: Diagnosis not present

## 2023-03-14 DIAGNOSIS — K219 Gastro-esophageal reflux disease without esophagitis: Secondary | ICD-10-CM | POA: Diagnosis not present

## 2023-04-24 ENCOUNTER — Other Ambulatory Visit (INDEPENDENT_AMBULATORY_CARE_PROVIDER_SITE_OTHER): Payer: Self-pay | Admitting: Primary Care

## 2023-04-26 NOTE — Telephone Encounter (Signed)
Requested Prescriptions  Pending Prescriptions Disp Refills   famotidine (PEPCID) 20 MG tablet [Pharmacy Med Name: FAMOTIDINE 20 MG ORAL TABLET] 180 tablet 0    Sig: TAKE 1 TABLET (20 MG TOTAL) BY MOUTH 2 (TWO) TIMES DAILY.     Gastroenterology:  H2 Antagonists Passed - 04/24/2023  2:48 PM      Passed - Valid encounter within last 12 months    Recent Outpatient Visits           2 months ago Pain and swelling of right knee   [place] [facility] Grayce Sessions, NP   5 months ago Vaginal irritation   [place] [facility] Grayce Sessions, NP   6 months ago Hospital discharge follow-up   [place] [facility] Grayce Sessions, NP   11 months ago Screening for colon cancer   [place] [facility] Grayce Sessions, NP   1 year ago Allergy, subsequent encounter   [place] [facility] Grayce Sessions, NP       Future Appointments             In 1 week Randa Evens, Kinnie Scales, NP  [facility]

## 2023-05-09 ENCOUNTER — Encounter (INDEPENDENT_AMBULATORY_CARE_PROVIDER_SITE_OTHER): Payer: Self-pay | Admitting: Primary Care

## 2023-05-09 ENCOUNTER — Ambulatory Visit (INDEPENDENT_AMBULATORY_CARE_PROVIDER_SITE_OTHER): Payer: Medicaid Other | Admitting: Primary Care

## 2023-05-09 VITALS — BP 109/71 | HR 80 | Resp 16 | Wt 208.0 lb

## 2023-05-09 DIAGNOSIS — Z23 Encounter for immunization: Secondary | ICD-10-CM

## 2023-05-09 DIAGNOSIS — I10 Essential (primary) hypertension: Secondary | ICD-10-CM

## 2023-05-09 DIAGNOSIS — F4323 Adjustment disorder with mixed anxiety and depressed mood: Secondary | ICD-10-CM | POA: Diagnosis not present

## 2023-05-09 DIAGNOSIS — Z760 Encounter for issue of repeat prescription: Secondary | ICD-10-CM

## 2023-05-09 DIAGNOSIS — L309 Dermatitis, unspecified: Secondary | ICD-10-CM | POA: Diagnosis not present

## 2023-05-09 MED ORDER — FLUTICASONE PROPIONATE 50 MCG/ACT NA SUSP: 1.0000 | Freq: Every day | NASAL | 0 refills | Status: DC

## 2023-05-09 MED ORDER — FOLIC ACID 1 MG PO TABS: 1.0000 mg | ORAL_TABLET | Freq: Every day | ORAL | 3 refills | Status: DC

## 2023-05-09 MED ORDER — FLUTICASONE PROPIONATE HFA 220 MCG/ACT IN AERO: 2.0000 | INHALATION_SPRAY | Freq: Two times a day (BID) | RESPIRATORY_TRACT | 5 refills | Status: DC

## 2023-05-09 MED ORDER — CETIRIZINE HCL 10 MG PO TABS: 10.0000 mg | ORAL_TABLET | Freq: Every day | ORAL | 1 refills | Status: DC | PRN

## 2023-05-09 MED ORDER — FAMOTIDINE 20 MG PO TABS: 20.0000 mg | ORAL_TABLET | Freq: Two times a day (BID) | ORAL | 0 refills | Status: DC

## 2023-05-09 MED ORDER — TRIAMCINOLONE 0.1 % CREAM:EUCERIN CREAM 1:1: 1.0000 | TOPICAL_CREAM | Freq: Three times a day (TID) | CUTANEOUS | 1 refills | Status: DC | PRN

## 2023-05-09 MED ORDER — AMLODIPINE BESYLATE 5 MG PO TABS: 5.0000 mg | ORAL_TABLET | Freq: Every day | ORAL | 1 refills | Status: DC

## 2023-05-09 MED ORDER — GABAPENTIN 300 MG PO CAPS: 300.0000 mg | ORAL_CAPSULE | Freq: Three times a day (TID) | ORAL | 0 refills | Status: DC

## 2023-05-09 NOTE — Progress Notes (Signed)
[facility]  Robin Fischer, is a 52 y.o. female  KGM:[id number]  [id number]:[id number]  DOB - 1971/02/14  Chief Complaint  Patient presents with   Hypertension       Subjective:   Robin Fischer is a 52 y.o. female here today for a follow up visit. Patient has No headache, No chest pain, No abdominal pain - No Nausea, No new weakness tingling or numbness, No Cough - shortness of breath. She states she has been taking amlodine  Depression , anxiety , insomnia. Denies suicidal ideation but she will hurt someone elese if felt threaten. She having auditory hallination no visual. She calls her daughter and confirms no one is there. No problems updated.  No Known Allergies  Past Medical History:  Diagnosis Date   Asthma    Bronchitis    GERD (gastroesophageal reflux disease)    High cholesterol    Hypertension    Pancreatitis    Tobacco abuse     Current Outpatient Medications on File Prior to Visit  Medication Sig Dispense Refill   lidocaine (LIDODERM) 5 % Place 1 patch onto the skin daily. Remove & Discard patch within 12 hours or as directed by MD 30 patch 0   naltrexone (DEPADE) 50 MG tablet Take 1 tablet (50 mg total) by mouth daily. 30 tablet 1   Olopatadine HCl 0.2 % SOLN Place 1 drop into both eyes daily as needed (for itching).     ondansetron (ZOFRAN-ODT) 4 MG disintegrating tablet Take 1 tablet (4 mg total) by mouth every 8 (eight) hours as needed for nausea or vomiting. 20 tablet 0   RESTASIS 0.05 % ophthalmic emulsion Place 1 drop into both eyes 2 (two) times daily. 60 each 3   thiamine (VITAMIN B-1) 100 MG tablet Take 1 tablet (100 mg total) by mouth daily. 30 tablet 3   thiamine (VITAMIN B1) 100 MG tablet Take 100 mg by mouth daily.     valsartan-hydrochlorothiazide (DIOVAN-HCT) 160-25 MG tablet Take 1 tablet by mouth daily. 90 tablet 3   VENTOLIN HFA 108 (90 Base) MCG/ACT inhaler INHALE 2 PUFFS INTO THE LUNGS EVERY 6 (SIX) HOURS AS NEEDED FOR WHEEZING OR  SHORTNESS OF BREATH. 18 g 2   Blood Pressure Monitor KIT 1 Bag by Does not apply route 3 (three) times daily as needed. (Patient not taking: Reported on 05/09/2023) 1 kit 0   predniSONE (STERAPRED UNI-PAK 21 TAB) 10 MG (21) TBPK tablet Day 1 take 6 pills by mouth once daily; Day 2 take 5 pills once daily; Day 3 take 4 pills once daily; Day 4 take 3 pills once daily; Day 5 take 2 tablets once daily; Day 6 take 1 tablet daily (Patient not taking: Reported on 05/09/2023) 21 tablet 0   traZODone (DESYREL) 50 MG tablet TAKE 1 TABLET (50 MG TOTAL) BY MOUTH AT BEDTIME. 30 tablet 3   TYLENOL 325 MG tablet Take 325-650 mg by mouth every 6 (six) hours as needed for mild pain or headache. (Patient not taking: Reported on 05/09/2023)     No current facility-administered medications on file prior to visit.    Objective:   Vitals:   05/09/23 1012  BP: 109/71  Pulse: 80  Resp: 16  SpO2: 90%  Weight: 208 lb (94.3 kg)    Comprehensive ROS Pertinent positive and negative noted in HPI   Exam General appearance : Awake, alert, not in any distress. Speech Clear. Not toxic looking HEENT: Atraumatic and Normocephalic, pupils equally reactive to light  and accomodation Neck: Supple, no JVD. No cervical lymphadenopathy.  Chest: Good air entry bilaterally, no added sounds  CVS: S1 S2 regular, no murmurs.  Abdomen: Bowel sounds present, Non tender and not distended with no gaurding, rigidity or rebound. Extremities: B/L Lower Ext shows no edema, both legs are warm to touch Neurology: Awake alert, and oriented X 3,  Non focal Skin: see picture  Data Review Lab Results  Component Value Date   HGBA1C 4.9 09/28/2021   HGBA1C 5.0 12/27/2019    Assessment & Plan  Metztli was seen today for hypertension.  Diagnoses and all orders for this visit:  Encounter for immunization -     Flu vaccine trivalent PF, 6mos and older(Flulaval,Afluria,Fluarix,Fluzone)  Primary hypertension D/C valsartan-hydrochlorothiazide  (DIOVAN-HCT) 160-25 MG table  Last visit changed amlodipine 5mg  note on profile she was still refilling the 10mg   -     amLODipine (NORVASC) 5 MG tablet; Take 1 tablet (5 mg total) by mouth daily. She will return for Bp ck   Adjustment disorder with mixed anxiety and depressed mood -     Ambulatory referral to Psychiatry  Eczema, unspecified type          Triamcinolone Acetonide (TRIAMCINOLONE 0.1 % CREAM : EUCERIN) CREA; Apply 1 Application topically 3 (three) times daily as needed. Other orders 2/2 Medication refill  -     Tdap vaccine greater than or equal to 7yo IM -     fluticasone (FLOVENT HFA) 220 MCG/ACT inhaler; Inhale 2 puffs into the lungs 2 (two) times daily. -     cetirizine (ZYRTEC) 10 MG tablet; Take 1 tablet (10 mg total) by mouth daily as needed for allergies or rhinitis. -     famotidine (PEPCID) 20 MG tablet; Take 1 tablet (20 mg total) by mouth 2 (two) times daily. -     fluticasone (FLONASE) 50 MCG/ACT nasal spray; Place 1 spray into both nostrils daily. -     folic acid (FOLVITE) 1 MG tablet; Take 1 tablet (1 mg total) by mouth daily. -     gabapentin (NEURONTIN) 300 MG capsule; Take 1 capsule (300 mg total) by mouth 3 (three) times daily. -     Patient have been counseled extensively about nutrition and exercise. Other issues discussed during this visit include: low cholesterol diet, weight control and daily exercise, foot care, annual eye examinations at Ophthalmology, importance of adherence with medications and regular follow-up. We also discussed long term complications of uncontrolled diabetes and hypertension.   Return in about 4 weeks (around 06/06/2023) for stop amlodipine  Bp ck.  The patient was given clear instructions to go to ER or return to medical center if symptoms don't improve, worsen or new problems develop. The patient verbalized understanding. The patient was told to call to get lab results if they haven't heard anything in the next week.    This note has been created with Education officer, environmental. Any transcriptional errors are unintentional.   Grayce Sessions, NP 05/09/2023, 2:05 PM

## 2023-05-09 NOTE — Patient Instructions (Addendum)
amLODipine (NORVASC) 5 MG tablet 5 mg, Daily       Summary: Take 1 tablet (5 mg total) by mouth daily., Starting Mon 02/06/2023, Normal Dose, Route, Frequency: 5 mg, Oral, DailyStart: 06/03/2024Ord/Sold: 02/06/2023 (O)Ordered On: 06/03/2024Pharmacy: [facility] - [place], [place] - [address] Associated: Taking: Long-term: Med Note:    Change Reorder Discontinue Patient Sig: Take 1 tablet (5 mg total) by mouth daily. Ordering Department: [facility] Authorized By: Grayce Sessions, NP Dispense: 90 tablet Refills: 1 ordered Dose History    Influenza Vaccine Injection What is this medication? INFLUENZA VACCINE (in floo EN zuh vak SEEN) reduces the risk of the influenza (flu). It does not treat influenza. It is still possible to get influenza after receiving this vaccine, but the symptoms may be less severe or not last as long. It works by helping your immune system learn how to fight off a future infection. This medicine may be used for other purposes; ask your health care provider or pharmacist if you have questions. COMMON BRAND NAME(S): Afluria Quadrivalent, FLUAD Quadrivalent, Fluarix Quadrivalent, Flublok Quadrivalent, FLUCELVAX Quadrivalent, Flulaval Quadrivalent, Fluzone Quadrivalent What should I tell my care team before I take this medication? They need to know if you have any of these conditions: Bleeding disorder like hemophilia Fever or infection Guillain-Barre syndrome or other neurological problems Immune system problems Infection with the human immunodeficiency virus (HIV) or AIDS Low blood platelet counts Multiple sclerosis An unusual or allergic reaction to influenza virus vaccine, latex, other medications, foods, dyes, or preservatives. Different brands of vaccines contain different allergens. Some may contain latex or eggs. Talk to your care team about your allergies to make sure that you get the right vaccine. Pregnant or  trying to get pregnant Breastfeeding How should I use this medication? This vaccine is injected into a muscle or under the skin. It is given by your care team. A copy of Vaccine Information Statements will be given before each vaccination. Be sure to read this sheet carefully each time. This sheet may change often. Talk to your care team to see which vaccines are right for you. Some vaccines should not be used in all age groups. Overdosage: If you think you have taken too much of this medicine contact a poison control center or emergency room at once. NOTE: This medicine is only for you. Do not share this medicine with others. What if I miss a dose? This does not apply. What may interact with this medication? Certain medications that lower your immune system, such as etanercept, anakinra, infliximab, adalimumab Certain medications that prevent or treat blood clots, such as warfarin Chemotherapy or radiation therapy Phenytoin Steroid medications, such as prednisone or cortisone Theophylline Vaccines This list may not describe all possible interactions. Give your health care provider a list of all the medicines, herbs, non-prescription drugs, or dietary supplements you use. Also tell them if you smoke, drink alcohol, or use illegal drugs. Some items may interact with your medicine. What should I watch for while using this medication? Report any side effects that do not go away with your care team. Call your care team if any unusual symptoms occur within 6 weeks of receiving this vaccine. You may still catch the flu, but the illness is not usually as bad. You cannot get the flu from the vaccine. The vaccine will not protect against colds or other illnesses that may cause fever. The vaccine is needed every year. What side effects may I notice from  receiving this medication? Side effects that you should report to your care team as soon as possible: Allergic reactions--skin rash, itching, hives,  swelling of the face, lips, tongue, or throat Side effects that usually do not require medical attention (report these to your care team if they continue or are bothersome): Chills Fatigue Headache Joint pain Loss of appetite Muscle pain Nausea Pain, redness, or irritation at injection site This list may not describe all possible side effects. Call your doctor for medical advice about side effects. You may report side effects to FDA at 1-800-FDA-1088. Where should I keep my medication? The vaccine is only given by your care team. It will not be stored at home. NOTE: This sheet is a summary. It may not cover all possible information. If you have questions about this medicine, talk to your doctor, pharmacist, or health care provider.  2024 Elsevier/Gold Standard (2022-02-01 00:00:00)

## 2023-05-10 ENCOUNTER — Telehealth (INDEPENDENT_AMBULATORY_CARE_PROVIDER_SITE_OTHER): Payer: Self-pay | Admitting: Primary Care

## 2023-05-10 ENCOUNTER — Other Ambulatory Visit (INDEPENDENT_AMBULATORY_CARE_PROVIDER_SITE_OTHER): Payer: Self-pay

## 2023-05-10 ENCOUNTER — Ambulatory Visit (INDEPENDENT_AMBULATORY_CARE_PROVIDER_SITE_OTHER): Payer: Self-pay | Admitting: *Deleted

## 2023-05-10 ENCOUNTER — Telehealth: Payer: Self-pay | Admitting: Primary Care

## 2023-05-10 NOTE — Telephone Encounter (Signed)
Pt called reporting that she received a printed copy of her Triamcinolone, however she needs the medication delivered because she does not drive.   She is requesting for the clinic to contact the pharmacy below and call the order in so she can have it delivered to her home.   [facility] - [place], [place] - [address]  [address] [place] [address]  Phone: [phone] Fax: [phone]

## 2023-05-10 NOTE — Telephone Encounter (Signed)
Message from [place] C sent at 05/10/2023 12:55 PM EDT  Summary: flu like symptoms   The patient received their flu shot yesterday from their PCP 05/09/23  The patient shares that they have began to experience congestion, sinus discomfort, nausea as well cold and hot flashes  The patient would like to discuss their concerns with a member of clinical staff when possible  Pease contact the patient further when possible          Call History  Contact Date/Time Type Contact Phone/Fax User  05/10/2023 12:50 PM EDT Phone (Incoming) Robin Fischer, Robin Fischer (Self) [phone] Judie Petit) Coley, Everette A   Reason for Disposition  [1] Sinus congestion as part of a cold AND [2] present < 10 days  Answer Assessment - Initial Assessment Questions 1. LOCATION: "Where does it hurt?"      I got my flu shot yesterday while in the office.   They ended up giving me 2 flu shots because the first one was expired.  Today I'm having sinus congestion, nausea and hot and cold flashes.   Also a headache.  Is that from the shot or am I sick? I let her know the headache, low grade fever and feeling hot and cold could be from the flu shot but the congestion doesn't usually go along with it. I encouraged her to test for Covid since there is a big up tick in cases right now.   She has a Covid test at home she will test tomorrow if she is still feeling bad or worse.   I let her know if it was positive to call us back if she is interested in being prescribed an antiviral.   She was agreeable to this plan.  She also asked me about a cream Gwinda Passe, NP prescribed for her Eczema.    I let her know it was sent to the pharmacy.   She also asked about a BP cuff.   Marcelino Duster was supposed to send an rx for a BP machine so Medicaid would pay for it but the pharmacy keeps telling her they don't have an rx for a BP machine. 2. ONSET: "When did the sinus pain start?"  (e.g., hours, days)      This morning 3. SEVERITY: "How bad is the  pain?"   (Scale 1-10; mild, moderate or severe)   - MILD (1-3): doesn't interfere with normal activities    - MODERATE (4-7): interferes with normal activities (e.g., work or school) or awakens from sleep   - SEVERE (8-10): excruciating pain and patient unable to do any normal activities        Moderate congestion and headache 4. RECURRENT SYMPTOM: "Have you ever had sinus problems before?" If Yes, ask: "When was the last time?" and "What happened that time?"      Not asked 5. NASAL CONGESTION: "Is the nose blocked?" If Yes, ask: "Can you open it or must you breathe through your mouth?"     Yes 6. NASAL DISCHARGE: "Do you have discharge from your nose?" If so ask, "What color?"     Yes 7. FEVER: "Do you have a fever?" If Yes, ask: "What is it, how was it measured, and when did it start?"      Low grade having hot and cold flashes 8. OTHER SYMPTOMS: "Do you have any other symptoms?" (e.g., sore throat, cough, earache, difficulty breathing)     Congestion, headache, feeling hot and cold and having some nausea 9. PREGNANCY: "Is there  any chance you are pregnant?" "When was your last menstrual period?"     Not asked due to age  Protocols used: Sinus Pain or Congestion-A-AH

## 2023-05-10 NOTE — Telephone Encounter (Signed)
Last refill was printed, patient request medication to be called in so it can be delivered, routing for approval.

## 2023-05-10 NOTE — Telephone Encounter (Signed)
  Chief Complaint: Had flu shot 9/3.  Today having sinus congestion, headache, hot and cold flashes and nausea.   Is it from the shot or am I sick?   I let her know the congestion would not be from the flu shot. Symptoms: She is going to test for Covid tomorrow 9/5 if she is still feeling bad. Frequency: Since this morning.   Woke up congested and feeling bad Pertinent Negatives: Patient denies N/A Disposition: [] ED /[] Urgent Care (no appt availability in office) / [] Appointment(In office/virtual)/ []  [facility]/ [] Home Care/ [] Refused Recommended Disposition /[] [facility]/ [x]  Follow-up with PCP Additional Notes: She asked about an order for a BP machine.   Marcelino Duster was supposed to send an order to [facility] for a BP machine but the pharmacy keeps telling her they don't have an order for a BP machine.    She is requesting an order so Medicaid will pay for it. She also asked about the cream that was ordered Triamcinolone Acetonide 0.1%.  It looks like it has been sent to the pharmacy.  She is requesting someone call her back about the status of the BP machine.

## 2023-05-10 NOTE — Telephone Encounter (Signed)
Copied from CRM [id number]. Topic: General - Other >> May 10, 2023 12:46 PM Everette C wrote: Reason for CRM: The patient has called to follow up on previous discussions related to a request for a blood pressure monitoring device as well as a rx for Triamcinolone Acetonide (TRIAMCINOLONE 0.1 % CREAM : EUCERIN) CREA [[id number]]  Please contact the patient further when possible

## 2023-05-11 ENCOUNTER — Other Ambulatory Visit (INDEPENDENT_AMBULATORY_CARE_PROVIDER_SITE_OTHER): Payer: Self-pay

## 2023-05-11 MED ORDER — TRIAMCINOLONE 0.1 % CREAM:EUCERIN CREAM 1:1: 1.0000 | TOPICAL_CREAM | Freq: Three times a day (TID) | CUTANEOUS | 1 refills | Status: DC | PRN

## 2023-05-11 MED ORDER — MISC. DEVICES MISC: 0 refills | Status: AC

## 2023-05-11 NOTE — Telephone Encounter (Signed)
Called in rx Triamcinolone Acetonide (TRIAMCINOLONE 0.1 % CREAM) to [facility] and sent in rx for bp cuff Made pt aware

## 2023-05-29 ENCOUNTER — Ambulatory Visit (INDEPENDENT_AMBULATORY_CARE_PROVIDER_SITE_OTHER): Payer: Self-pay

## 2023-05-29 NOTE — Telephone Encounter (Signed)
Will forward to provider

## 2023-05-29 NOTE — Telephone Encounter (Signed)
  Chief Complaint: bilateral lower leg pain Right worse than left. Right knee is swollen Symptoms: limping tearful and frustrated"aggravated" wanting Prednisone taper Frequency: Thursday  Pertinent Negatives: Patient denies fever Disposition: [] ED /[] Urgent Care (no appt availability in office) / [] Appointment(In office/virtual)/ []  [facility]/ [] Home Care/ [x] Refused Recommended Disposition /[] [facility]/ []  Follow-up with PCP Additional Notes: pt refused appt Reason for Disposition  [1] SEVERE pain (e.g., excruciating, unable to do any normal activities) AND [2] not improved after 2 hours of pain medicine  Answer Assessment - Initial Assessment Questions 1. ONSET: "When did the pain start?"      Thursday  2. LOCATION: "Where is the pain located?"      Both knees hurt and right knee swollen  3. PAIN: "How bad is the pain?"    (Scale 1-10; or mild, moderate, severe)   -  MILD (1-3): doesn't interfere with normal activities    -  MODERATE (4-7): interferes with normal activities (e.g., work or school) or awakens from sleep, limping    -  SEVERE (8-10): excruciating pain, unable to do any normal activities, unable to walk     severe 4. WORK OR EXERCISE: "Has there been any recent work or exercise that involved this part of the body?"      *No Answer* 5. CAUSE: "What do you think is causing the leg pain?"     Gout  6. OTHER SYMPTOMS: "Do you have any other symptoms?" (e.g., chest pain, back pain, breathing difficulty, swelling, rash, fever, numbness, weakness)     swelling 7. PREGNANCY: "Is there any chance you are pregnant?" "When was your last menstrual period?"     *No Answer*  Protocols used: Leg Pain-A-AH

## 2023-05-29 NOTE — Telephone Encounter (Signed)
Schedule

## 2023-06-08 ENCOUNTER — Telehealth (INDEPENDENT_AMBULATORY_CARE_PROVIDER_SITE_OTHER): Payer: Self-pay | Admitting: Primary Care

## 2023-06-08 ENCOUNTER — Ambulatory Visit (INDEPENDENT_AMBULATORY_CARE_PROVIDER_SITE_OTHER): Payer: Medicaid Other | Admitting: Primary Care

## 2023-06-08 ENCOUNTER — Encounter (INDEPENDENT_AMBULATORY_CARE_PROVIDER_SITE_OTHER): Payer: Self-pay | Admitting: Primary Care

## 2023-06-08 VITALS — BP 127/83 | HR 76 | Resp 16 | Wt 213.0 lb

## 2023-06-08 DIAGNOSIS — Z0130 Encounter for examination of blood pressure without abnormal findings: Secondary | ICD-10-CM | POA: Diagnosis not present

## 2023-06-08 DIAGNOSIS — M1A0610 Idiopathic chronic gout, right knee, without tophus (tophi): Secondary | ICD-10-CM

## 2023-06-08 DIAGNOSIS — M25561 Pain in right knee: Secondary | ICD-10-CM

## 2023-06-08 DIAGNOSIS — I10 Essential (primary) hypertension: Secondary | ICD-10-CM | POA: Diagnosis not present

## 2023-06-08 DIAGNOSIS — M25461 Effusion, right knee: Secondary | ICD-10-CM

## 2023-06-08 MED ORDER — KETOROLAC TROMETHAMINE 60 MG/2ML IM SOLN: 60.0000 mg | Freq: Once | INTRAMUSCULAR | Status: AC

## 2023-06-08 MED ORDER — PREDNISONE 10 MG (21) PO TBPK: ORAL_TABLET | ORAL | 0 refills | Status: DC

## 2023-06-08 NOTE — Telephone Encounter (Signed)
Medication Refill - Medication: predniSONE (STERAPRED UNI-PAK 21 TAB) 10 MG (21) TBPK tablet   Has the patient contacted their pharmacy? Yes.   Waiting on refill request  Preferred Pharmacy (with phone number or street name):  [facility] - [place], [place] - [address] Phone: [phone]  Fax: [phone]     Has the patient been seen for an appointment in the last year OR does the patient have an upcoming appointment? Yes.    Agent: Please be advised that RX refills may take up to 3 business days. We ask that you follow-up with your pharmacy.

## 2023-06-08 NOTE — Progress Notes (Signed)
[facility]   Robin Fischer is a 52 y.o. female presents for hypertension evaluation, (Bp check) Denies shortness of breath, headaches, chest pain or lower extremity edema, sudden onset, vision changes, unilateral weakness, dizziness, paresthesias . Right knee warm swollen. She has a hx of Gout elevated uric acid.  Patient reports adherence with medications.  Dietary habits include: monitoring sodium and processed foods Exercise habits include:walking  Family / Social history: HTN and hyperlipidemia   Past Medical History:  Diagnosis Date   Asthma    Bronchitis    GERD (gastroesophageal reflux disease)    High cholesterol    Hypertension    Pancreatitis    Tobacco abuse    Past Surgical History:  Procedure Laterality Date   TRANSURETHRAL RESECTION OF BLADDER TUMOR WITH MITOMYCIN-C N/A 09/24/2021   Procedure: CYSTOSCOPY; CLOT EVACUATION; FULGERATION OF URETHRA;  Surgeon: Jerilee Field, MD;  Location: [facility];  Service: Urology;  Laterality: N/A;   No Known Allergies Current Outpatient Medications on File Prior to Visit  Medication Sig Dispense Refill   amLODipine (NORVASC) 5 MG tablet Take 1 tablet (5 mg total) by mouth daily. 90 tablet 1   Blood Pressure Monitor KIT 1 Bag by Does not apply route 3 (three) times daily as needed. (Patient not taking: Reported on 05/09/2023) 1 kit 0   cetirizine (ZYRTEC) 10 MG tablet Take 1 tablet (10 mg total) by mouth daily as needed for allergies or rhinitis. 90 tablet 1   famotidine (PEPCID) 20 MG tablet Take 1 tablet (20 mg total) by mouth 2 (two) times daily. 180 tablet 0   fluticasone (FLONASE) 50 MCG/ACT nasal spray Place 1 spray into both nostrils daily. 16 g 0   fluticasone (FLOVENT HFA) 220 MCG/ACT inhaler Inhale 2 puffs into the lungs 2 (two) times daily. 36 g 5   folic acid (FOLVITE) 1 MG tablet Take 1 tablet (1 mg total) by mouth daily. 30 tablet 3   gabapentin (NEURONTIN) 300 MG capsule Take 1 capsule (300 mg total)  by mouth 3 (three) times daily. 90 capsule 0   lidocaine (LIDODERM) 5 % Place 1 patch onto the skin daily. Remove & Discard patch within 12 hours or as directed by MD 30 patch 0   Misc. Devices MISC Blood pressure Cuff 1 Device 0   naltrexone (DEPADE) 50 MG tablet Take 1 tablet (50 mg total) by mouth daily. 30 tablet 1   Olopatadine HCl 0.2 % SOLN Place 1 drop into both eyes daily as needed (for itching).     ondansetron (ZOFRAN-ODT) 4 MG disintegrating tablet Take 1 tablet (4 mg total) by mouth every 8 (eight) hours as needed for nausea or vomiting. 20 tablet 0   predniSONE (STERAPRED UNI-PAK 21 TAB) 10 MG (21) TBPK tablet Day 1 take 6 pills by mouth once daily; Day 2 take 5 pills once daily; Day 3 take 4 pills once daily; Day 4 take 3 pills once daily; Day 5 take 2 tablets once daily; Day 6 take 1 tablet daily (Patient not taking: Reported on 05/09/2023) 21 tablet 0   RESTASIS 0.05 % ophthalmic emulsion Place 1 drop into both eyes 2 (two) times daily. 60 each 3   thiamine (VITAMIN B-1) 100 MG tablet Take 1 tablet (100 mg total) by mouth daily. 30 tablet 3   thiamine (VITAMIN B1) 100 MG tablet Take 100 mg by mouth daily.     traZODone (DESYREL) 50 MG tablet TAKE 1 TABLET (50 MG TOTAL) BY MOUTH AT BEDTIME.  30 tablet 3   Triamcinolone Acetonide (TRIAMCINOLONE 0.1 % CREAM : EUCERIN) CREA Apply 1 Application topically 3 (three) times daily as needed. 464 each 1   TYLENOL 325 MG tablet Take 325-650 mg by mouth every 6 (six) hours as needed for mild pain or headache. (Patient not taking: Reported on 05/09/2023)     valsartan-hydrochlorothiazide (DIOVAN-HCT) 160-25 MG tablet Take 1 tablet by mouth daily. 90 tablet 3   VENTOLIN HFA 108 (90 Base) MCG/ACT inhaler INHALE 2 PUFFS INTO THE LUNGS EVERY 6 (SIX) HOURS AS NEEDED FOR WHEEZING OR SHORTNESS OF BREATH. 18 g 2   No current facility-administered medications on file prior to visit.   Social History   Socioeconomic History   Marital status: Single     Spouse name: Not on file   Number of children: Not on file   Years of education: Not on file   Highest education level: Not on file  Occupational History   Not on file  Tobacco Use   Smoking status: Every Day    Current packs/day: 0.50    Types: Cigarettes   Smokeless tobacco: Never  Vaping Use   Vaping status: Never Used  Substance and Sexual Activity   Alcohol use: Yes    Alcohol/week: 6.0 standard drinks of alcohol    Types: 2 Cans of beer, 4 Shots of liquor per week    Comment: beer   Drug use: No   Sexual activity: Not on file  Other Topics Concern   Not on file  Social History Narrative   Lives with daughter   Left handed   Caffeine: zero   Social Determinants of Health   Financial Resource Strain: Not on file  Food Insecurity: No Food Insecurity (09/28/2022)   Hunger Vital Sign    Worried About Running Out of Food in the Last Year: Never true    Ran Out of Food in the Last Year: Never true  Transportation Needs: No Transportation Needs (09/28/2022)   PRAPARE - Administrator, Civil Service (Medical): No    Lack of Transportation (Non-Medical): No  Physical Activity: Not on file  Stress: Not on file  Social Connections: Not on file  Intimate Partner Violence: Not At Risk (09/28/2022)   Humiliation, Afraid, Rape, and Kick questionnaire    Fear of Current or Ex-Partner: No    Emotionally Abused: No    Physically Abused: No    Sexually Abused: No   Family History  Problem Relation Age of Onset   Hypertension Mother    Hyperlipidemia Mother    Hypertension Father    Hyperlipidemia Father    Migraines Neg Hx    Headache Neg Hx      OBJECTIVE:  Vitals:   06/08/23 1024  BP: 127/83  Pulse: 76  Resp: 16  SpO2: 96%  Weight: 213 lb (96.6 kg)    Physical Exam Vitals reviewed.  Constitutional:      Appearance: She is obese.  HENT:     Head: Normocephalic.     Right Ear: Tympanic membrane normal.     Left Ear: Tympanic membrane normal.      Nose: Nose normal.  Eyes:     Extraocular Movements: Extraocular movements intact.  Cardiovascular:     Rate and Rhythm: Normal rate and regular rhythm.  Pulmonary:     Effort: Pulmonary effort is normal.     Breath sounds: Normal breath sounds.  Abdominal:     General: Bowel sounds are normal. There  is distension.     Palpations: Abdomen is soft.  Musculoskeletal:     Cervical back: Normal range of motion and neck supple.     Comments: Pain right knee swollen warm difficulty walking  Skin:    General: Skin is warm and dry.  Neurological:     Mental Status: She is alert and oriented to person, place, and time.  Psychiatric:        Mood and Affect: Mood normal.        Behavior: Behavior normal.     ROS  Last 3 Office BP readings: BP Readings from Last 3 Encounters:  06/08/23 127/83  05/09/23 109/71  02/06/23 108/66    BMET    Component Value Date/Time   NA 134 (L) 09/29/2022 0513   NA 141 12/27/2019 0838   K 3.8 09/29/2022 0513   CL 98 09/29/2022 0513   CO2 28 09/29/2022 0513   GLUCOSE 101 (H) 09/29/2022 0513   BUN 7 09/29/2022 0513   BUN 4 (L) 12/27/2019 0838   CREATININE 0.89 09/29/2022 0513   CALCIUM 8.6 (L) 09/29/2022 0513   GFRNONAA >60 09/29/2022 0513   GFRAA 122 12/27/2019 1610    Renal function: CrCl cannot be calculated (Patient's most recent lab result is older than the maximum 21 days allowed.).  Clinical ASCVD:  The ASCVD Risk score (Arnett DK, et al., 2019) failed to calculate for the following reasons:   Cannot find a previous HDL lab   Cannot find a previous total cholesterol lab  ASCVD risk factors include- [place]   ASSESSMENT & PLAN: Jaime was seen today for blood pressure check and gout.  Diagnoses and all orders for this visit:  Pain and swelling of right knee 2/2 Chronic gout of right knee, unspecified cause  -     ketorolac (TORADOL) injection 60 mg -     predniSONE (STERAPRED UNI-PAK 21 TAB) 10 MG (21) TBPK tablet; Day 1 take 6  pills by mouth once daily; Day 2 take 5 pills once daily; Day 3 take 4 pills once daily; Day 4 take 3 pills once daily; Day 5 take 2 tablets once daily; Day 6 take 1 tablet daily  Primary hypertension 2/2 BP check    -Counseled on lifestyle modifications for blood pressure control including reduced dietary sodium, increased exercise, weight reduction and adequate sleep. Also, educated patient about the risk for cardiovascular events, stroke and heart attack. Also counseled patient about the importance of medication adherence. If you participate in smoking, it is important to stop using tobacco as this will increase the risks associated with uncontrolled blood pressure.   Goal BP:  For patients younger than 60: Goal BP < 130/80. For patients 60 and older: Goal BP < 140/90. For patients with diabetes: Goal BP < 130/80. Your most recent BP: 127/83  Minimize salt intake. Minimize alcohol intake    This note has been created with Education officer, environmental. Any transcriptional errors are unintentional.   Grayce Sessions, NP 06/08/2023, 2:34 PM

## 2023-06-08 NOTE — Telephone Encounter (Signed)
Pt was seen today rx will be sent in

## 2023-06-28 ENCOUNTER — Telehealth (INDEPENDENT_AMBULATORY_CARE_PROVIDER_SITE_OTHER): Payer: Self-pay | Admitting: Primary Care

## 2023-06-28 ENCOUNTER — Other Ambulatory Visit (INDEPENDENT_AMBULATORY_CARE_PROVIDER_SITE_OTHER): Payer: Self-pay | Admitting: Primary Care

## 2023-06-28 MED ORDER — ALLOPURINOL 100 MG PO TABS: 100.0000 mg | ORAL_TABLET | Freq: Every day | ORAL | 3 refills | Status: DC

## 2023-06-28 NOTE — Telephone Encounter (Signed)
Pt called had concerns about continuous knee and leg pain. Wants  NP to contact her.

## 2023-06-28 NOTE — Telephone Encounter (Signed)
Patient states she has continuous pain in her right knee. Medication helped for a short period of time but now she feels it again. She is unable to extended her leg out all the way or apply weight.   Are you able to other medications to help with pain, gout? She is unsure what she has going on. Would like further assistance.

## 2023-06-30 ENCOUNTER — Other Ambulatory Visit (INDEPENDENT_AMBULATORY_CARE_PROVIDER_SITE_OTHER): Payer: Self-pay

## 2023-06-30 MED ORDER — TRIAMCINOLONE 0.1 % CREAM:EUCERIN CREAM 1:1: 1.0000 | TOPICAL_CREAM | Freq: Three times a day (TID) | CUTANEOUS | 1 refills | Status: DC | PRN

## 2023-07-24 ENCOUNTER — Ambulatory Visit (INDEPENDENT_AMBULATORY_CARE_PROVIDER_SITE_OTHER): Payer: Self-pay

## 2023-07-24 NOTE — Telephone Encounter (Signed)
       Chief Complaint: Right leg swelling, painful to walk on. Symptoms: Above Frequency: Several months Pertinent Negatives: Patient denies SOB, chest pain Disposition: [] ED /[] Urgent Care (no appt availability in office) / [x] Appointment(In office/virtual)/ []  [facility]/ [] Home Care/ [] Refused Recommended Disposition /[]  Mobile Bus/ []  Follow-up with PCP Additional Notes: Agrees with appointment. Will go to ED for worsening of symptoms.  Reason for Disposition  [1] MODERATE leg swelling (e.g., swelling extends up to knees) AND [2] new-onset or worsening  Answer Assessment - Initial Assessment Questions 1. ONSET: "When did the swelling start?" (e.g., minutes, hours, days)     Several months 2. LOCATION: "What part of the leg is swollen?"  "Are both legs swollen or just one leg?"     Right 3. SEVERITY: "How bad is the swelling?" (e.g., localized; mild, moderate, severe)   - Localized: Small area of swelling localized to one leg.   - MILD pedal edema: Swelling limited to foot and ankle, pitting edema < 1/4 inch (6 mm) deep, rest and elevation eliminate most or all swelling.   - MODERATE edema: Swelling of lower leg to knee, pitting edema > 1/4 inch (6 mm) deep, rest and elevation only partially reduce swelling.   - SEVERE edema: Swelling extends above knee, facial or hand swelling present.      Moderate 4. REDNESS: "Does the swelling look red or infected?"     No 5. PAIN: "Is the swelling painful to touch?" If Yes, ask: "How painful is it?"   (Scale 1-10; mild, moderate or severe)     Severe 6. FEVER: "Do you have a fever?" If Yes, ask: "What is it, how was it measured, and when did it start?"      No 7. CAUSE: "What do you think is causing the leg swelling?"     Maybe gout 8. MEDICAL HISTORY: "Do you have a history of blood clots (e.g., DVT), cancer, heart failure, kidney disease, or liver failure?"     No 9. RECURRENT SYMPTOM: "Have you had leg swelling  before?" If Yes, ask: "When was the last time?" "What happened that time?"     No 10. OTHER SYMPTOMS: "Do you have any other symptoms?" (e.g., chest pain, difficulty breathing)       No 11. PREGNANCY: "Is there any chance you are pregnant?" "When was your last menstrual period?"       No  Protocols used: Leg Swelling and Edema-A-AH

## 2023-07-25 ENCOUNTER — Encounter (INDEPENDENT_AMBULATORY_CARE_PROVIDER_SITE_OTHER): Payer: Self-pay | Admitting: Primary Care

## 2023-07-25 ENCOUNTER — Ambulatory Visit (INDEPENDENT_AMBULATORY_CARE_PROVIDER_SITE_OTHER): Payer: Medicaid Other | Admitting: Primary Care

## 2023-07-25 VITALS — BP 131/70 | HR 81 | Resp 16 | Wt 216.4 lb

## 2023-07-25 DIAGNOSIS — M25561 Pain in right knee: Secondary | ICD-10-CM

## 2023-07-25 DIAGNOSIS — R202 Paresthesia of skin: Secondary | ICD-10-CM

## 2023-07-25 DIAGNOSIS — I10 Essential (primary) hypertension: Secondary | ICD-10-CM

## 2023-07-25 DIAGNOSIS — M25461 Effusion, right knee: Secondary | ICD-10-CM | POA: Diagnosis not present

## 2023-07-25 DIAGNOSIS — Z760 Encounter for issue of repeat prescription: Secondary | ICD-10-CM

## 2023-07-25 DIAGNOSIS — Z23 Encounter for immunization: Secondary | ICD-10-CM | POA: Diagnosis not present

## 2023-07-25 DIAGNOSIS — Z1211 Encounter for screening for malignant neoplasm of colon: Secondary | ICD-10-CM

## 2023-07-25 DIAGNOSIS — E559 Vitamin D deficiency, unspecified: Secondary | ICD-10-CM

## 2023-07-25 MED ORDER — TRAZODONE HCL 50 MG PO TABS: 50.0000 mg | ORAL_TABLET | Freq: Every day | ORAL | 3 refills | Status: DC

## 2023-07-25 MED ORDER — VALSARTAN-HYDROCHLOROTHIAZIDE 160-25 MG PO TABS: 1.0000 | ORAL_TABLET | Freq: Every day | ORAL | 3 refills | Status: DC

## 2023-07-25 MED ORDER — AMLODIPINE BESYLATE 5 MG PO TABS: 5.0000 mg | ORAL_TABLET | Freq: Every day | ORAL | 1 refills | Status: DC

## 2023-07-25 MED ORDER — GABAPENTIN 300 MG PO CAPS: 300.0000 mg | ORAL_CAPSULE | Freq: Three times a day (TID) | ORAL | 0 refills | Status: DC

## 2023-07-25 MED ORDER — ALLOPURINOL 100 MG PO TABS: 100.0000 mg | ORAL_TABLET | Freq: Every day | ORAL | 3 refills | Status: DC

## 2023-07-25 NOTE — Progress Notes (Signed)
[facility]  Robin Fischer, is a 52 y.o. female  LKG:[id number]  GUY:[id number]  DOB - 11-Oct-1970  Chief Complaint  Patient presents with   Leg Pain    Right  swelling       Subjective:   HPI  Robin Fischer is a  52 year old obese female provide a for leg pain that has been recurrent for the last 6 months.  Uric acid was elevated treated with: Prednisone twice.  Patient presents today with 8 out of 10 pain right leg swollen entire knee right greater than left palpated cause pain no mass/nodule present leg is cool to touch, no redness present but tenderness, she also has numbness and tingling in her feet.  We discussed increased risks for gout which she has not adhered to presents as gout versus swelling questionable arthritis ROM crepitus .  No Known Allergies  Past Medical History:  Diagnosis Date   Asthma    Bronchitis    GERD (gastroesophageal reflux disease)    High cholesterol    Hypertension    Pancreatitis    Tobacco abuse     Current Outpatient Medications on File Prior to Visit  Medication Sig Dispense Refill   Blood Pressure Monitor KIT 1 Bag by Does not apply route 3 (three) times daily as needed. (Patient not taking: Reported on 05/09/2023) 1 kit 0   cetirizine (ZYRTEC) 10 MG tablet Take 1 tablet (10 mg total) by mouth daily as needed for allergies or rhinitis. 90 tablet 1   famotidine (PEPCID) 20 MG tablet Take 1 tablet (20 mg total) by mouth 2 (two) times daily. 180 tablet 0   fluticasone (FLONASE) 50 MCG/ACT nasal spray Place 1 spray into both nostrils daily. 16 g 0   fluticasone (FLOVENT HFA) 220 MCG/ACT inhaler Inhale 2 puffs into the lungs 2 (two) times daily. 36 g 5   folic acid (FOLVITE) 1 MG tablet Take 1 tablet (1 mg total) by mouth daily. 30 tablet 3   lidocaine (LIDODERM) 5 % Place 1 patch onto the skin daily. Remove & Discard patch within 12 hours or as directed by MD 30 patch 0   Misc. Devices MISC Blood pressure Cuff 1 Device 0    naltrexone (DEPADE) 50 MG tablet Take 1 tablet (50 mg total) by mouth daily. 30 tablet 1   Olopatadine HCl 0.2 % SOLN Place 1 drop into both eyes daily as needed (for itching).     ondansetron (ZOFRAN-ODT) 4 MG disintegrating tablet Take 1 tablet (4 mg total) by mouth every 8 (eight) hours as needed for nausea or vomiting. 20 tablet 0   predniSONE (STERAPRED UNI-PAK 21 TAB) 10 MG (21) TBPK tablet Day 1 take 6 pills by mouth once daily; Day 2 take 5 pills once daily; Day 3 take 4 pills once daily; Day 4 take 3 pills once daily; Day 5 take 2 tablets once daily; Day 6 take 1 tablet daily 21 tablet 0   RESTASIS 0.05 % ophthalmic emulsion Place 1 drop into both eyes 2 (two) times daily. 60 each 3   thiamine (VITAMIN B-1) 100 MG tablet Take 1 tablet (100 mg total) by mouth daily. 30 tablet 3   thiamine (VITAMIN B1) 100 MG tablet Take 100 mg by mouth daily.     Triamcinolone Acetonide (TRIAMCINOLONE 0.1 % CREAM : EUCERIN) CREA Apply 1 Application topically 3 (three) times daily as needed. 464 each 1   TYLENOL 325 MG tablet Take 325-650 mg by mouth every 6 (six) hours  as needed for mild pain or headache. (Patient not taking: Reported on 05/09/2023)     VENTOLIN HFA 108 (90 Base) MCG/ACT inhaler INHALE 2 PUFFS INTO THE LUNGS EVERY 6 (SIX) HOURS AS NEEDED FOR WHEEZING OR SHORTNESS OF BREATH. 18 g 2   No current facility-administered medications on file prior to visit.    Objective:   Vitals:   07/25/23 0852 07/25/23 0855  BP: (!) 145/79 131/70  Pulse: 81   Resp: 16   SpO2: 96%   Weight: 216 lb 6.4 oz (98.2 kg)     Comprehensive ROS Pertinent positive and negative noted in HPI   Exam General appearance : Awake, alert, not in any distress. Speech Clear. Not toxic looking HEENT: Atraumatic and Normocephalic, pupils equally reactive to light and accomodation Neck: Supple, no JVD. No cervical lymphadenopathy.  Chest: Good air entry bilaterally, no added sounds  CVS: S1 S2 regular, no murmurs.   Abdomen: Bowel sounds present, Non tender and not distended with no gaurding, rigidity or rebound. Extremities: see HPI (popping) Neurology: Awake alert, and oriented X 3, CN II-XII intact, Non focal Skin: No Rash  Data Review Lab Results  Component Value Date   HGBA1C 4.9 09/28/2021   HGBA1C 5.0 12/27/2019    Assessment & Plan  Robin Fischer was seen today for leg pain.  Diagnoses and all orders for this visit:  Pain and swelling of right knee -  -     Uric Acid -     gabapentin (NEURONTIN) 300 MG capsule; Take 1 capsule (300 mg total) by mouth 3 (three) times daily. Orthopedic referral       Medication refill -     allopurinol (ZYLOPRIM) 100 MG tablet; Take 1 tablet (100 mg total) by mouth daily. -     amLODipine (NORVASC) 5 MG tablet; Take 1 tablet (5 mg total) by mouth daily. -     gabapentin (NEURONTIN) 300 MG capsule; Take 1 capsule (300 mg total) by mouth 3 (three) times daily. -     traZODone (DESYREL) 50 MG tablet; Take 1 tablet (50 mg total) by mouth at bedtime. -     valsartan-hydrochlorothiazide (DIOVAN-HCT) 160-25 MG tablet; Take 1 tablet by mouth daily.  Paresthesias -     Discontinue: gabapentin (NEURONTIN) 300 MG capsule; Take 1 capsule (300 mg total) by mouth 3 (three) times daily. -     gabapentin (NEURONTIN) 300 MG capsule; Take 1 capsule (300 mg total) by mouth 3 (three) times daily.  Vitamin D deficiency -     Vitamin D, 25-hydroxy  Primary hypertension -     CMP14+EGFR -     amLODipine (NORVASC) 5 MG tablet; Take 1 tablet (5 mg total) by mouth daily. -     valsartan-hydrochlorothiazide (DIOVAN-HCT) 160-25 MG tablet; Take 1 tablet by mouth daily.  Colon cancer screening -     Ambulatory referral to Gastroenterology       Patient have been counseled extensively about nutrition and exercise. Other issues discussed during this visit include: low cholesterol diet, weight control and daily exercise, foot care, annual eye examinations at Ophthalmology,  importance of adherence with medications and regular follow-up. We also discussed long term complications of uncontrolled diabetes and hypertension.   Return in about 4 weeks (around 08/22/2023).  The patient was given clear instructions to go to ER or return to medical center if symptoms don't improve, worsen or new problems develop. The patient verbalized understanding. The patient was told to call to get lab results  if they haven't heard anything in the next week.   This note has been created with Education officer, environmental. Any transcriptional errors are unintentional.   Grayce Sessions, NP 07/30/2023, 9:27 PM

## 2023-07-25 NOTE — Patient Instructions (Signed)
Zoster Vaccine Injection What is this medication? ZOSTER VACCINE (ZOS ter vak SEEN) reduces the risk of herpes zoster (shingles). It does not treat shingles. It is still possible to get shingles after receiving the vaccine, but the symptoms may be less severe or not last as long. It works by helping your immune system learn how to fight off a future infection. This medicine may be used for other purposes; ask your health care provider or pharmacist if you have questions. COMMON BRAND NAME(S): [facility] What should I tell my care team before I take this medication? They need to know if you have any of these conditions: Cancer Immune system problems An unusual or allergic reaction to Zoster vaccine, other medications, foods, dyes, or preservatives Pregnant or trying to get pregnant Breastfeeding How should I use this medication? This vaccine is injected into a muscle. It is given by your care team. This vaccine requires 2 doses to get the full benefit. Set a reminder for when your next dose is due. A copy of Vaccine Information Statements will be given before each vaccination. Be sure to read this information carefully each time. This sheet may change often. Talk to your care team about the use of this vaccine in children. This vaccine is not approved for use in children. Overdosage: If you think you have taken too much of this medicine contact a poison control center or emergency room at once. NOTE: This medicine is only for you. Do not share this medicine with others. What if I miss a dose? Keep appointments for follow-up (booster) doses. It is important not to miss your dose. Call your care team if you are unable to keep an appointment. What may interact with this medication? Medications that suppress your immune system Medications to treat cancer Steroid medications, such as prednisone or cortisone This list may not describe all possible interactions. Give your health care provider a list  of all the medicines, herbs, non-prescription drugs, or dietary supplements you use. Also tell them if you smoke, drink alcohol, or use illegal drugs. Some items may interact with your medicine. What should I watch for while using this medication? Visit your care team regularly. This vaccine, like all vaccines, may not fully protect everyone. What side effects may I notice from receiving this medication? Side effects that you should report to your care team as soon as possible: Allergic reactions--skin rash, itching, hives, swelling of the face, lips, tongue, or throat Side effects that usually do not require medical attention (report these to your care team if they continue or are bothersome): Chills Fatigue Feeling faint or lightheaded Fever Headache Muscle pain Pain, redness, or irritation at injection site This list may not describe all possible side effects. Call your doctor for medical advice about side effects. You may report side effects to FDA at 1-800-FDA-1088. Where should I keep my medication? This vaccine is only given by your care team. It will not be stored at home. NOTE: This sheet is a summary. It may not cover all possible information. If you have questions about this medicine, talk to your doctor, pharmacist, or health care provider.  2024 Elsevier/Gold Standard (2022-02-10 00:00:00)

## 2023-07-26 LAB — VITAMIN D 25 HYDROXY (VIT D DEFICIENCY, FRACTURES): Vit D, 25-Hydroxy: 38.1 ng/mL (ref 30.0–100.0)

## 2023-07-26 LAB — CMP14+EGFR
ALT: 24 [IU]/L (ref 0–32)
AST: 24 [IU]/L (ref 0–40)
Albumin: 4.7 g/dL (ref 3.8–4.9)
Alkaline Phosphatase: 132 [IU]/L — ABNORMAL HIGH (ref 44–121)
BUN/Creatinine Ratio: 14 (ref 9–23)
BUN: 12 mg/dL (ref 6–24)
Bilirubin Total: 0.5 mg/dL (ref 0.0–1.2)
CO2: 28 mmol/L (ref 20–29)
Calcium: 10.1 mg/dL (ref 8.7–10.2)
Chloride: 101 mmol/L (ref 96–106)
Creatinine, Ser: 0.83 mg/dL (ref 0.57–1.00)
Globulin, Total: 2.9 g/dL (ref 1.5–4.5)
Glucose: 96 mg/dL (ref 70–99)
Potassium: 4.1 mmol/L (ref 3.5–5.2)
Sodium: 143 mmol/L (ref 134–144)
Total Protein: 7.6 g/dL (ref 6.0–8.5)
eGFR: 84 mL/min/{1.73_m2} (ref >59)

## 2023-07-26 LAB — URIC ACID: Uric Acid: 5.7 mg/dL (ref 3.0–7.2)

## 2023-07-27 ENCOUNTER — Other Ambulatory Visit: Payer: Self-pay

## 2023-07-27 ENCOUNTER — Encounter: Payer: Self-pay | Admitting: Orthopedic Surgery

## 2023-07-27 ENCOUNTER — Other Ambulatory Visit (INDEPENDENT_AMBULATORY_CARE_PROVIDER_SITE_OTHER): Payer: Medicaid Other

## 2023-07-27 ENCOUNTER — Ambulatory Visit: Payer: Medicaid Other | Admitting: Orthopedic Surgery

## 2023-07-27 ENCOUNTER — Telehealth: Payer: Self-pay | Admitting: Orthopedic Surgery

## 2023-07-27 DIAGNOSIS — G8929 Other chronic pain: Secondary | ICD-10-CM | POA: Diagnosis not present

## 2023-07-27 DIAGNOSIS — M25561 Pain in right knee: Secondary | ICD-10-CM | POA: Diagnosis not present

## 2023-07-27 MED ORDER — LIDOCAINE HCL (PF) 1 % IJ SOLN
5.0000 mL | INTRAMUSCULAR | Status: AC | PRN
  Administered 2023-07-27: 5 mL

## 2023-07-27 MED ORDER — COLCHICINE 0.6 MG PO TABS: 0.6000 mg | ORAL_TABLET | Freq: Every day | ORAL | 3 refills | Status: DC

## 2023-07-27 MED ORDER — METHYLPREDNISOLONE ACETATE 40 MG/ML IJ SUSP
40.0000 mg | INTRAMUSCULAR | Status: AC | PRN
  Administered 2023-07-27: 40 mg via INTRA_ARTICULAR

## 2023-07-27 NOTE — Telephone Encounter (Signed)
Changed to normal and sent to pharmacy.

## 2023-07-27 NOTE — Progress Notes (Signed)
Office Visit Note   Patient: Robin Fischer           Date of Birth: 11-17-1970           MRN: [id number] Visit Date: 07/27/2023              Requested by: Grayce Sessions, NP [address] [place],  [place] [address] PCP: Grayce Sessions, NP  Chief Complaint  Patient presents with   Right Knee - Pain      HPI: Patient is a 52 year old woman who presents for initial evaluation of right knee pain.  Patient most recently has had a uric acid that was 5.7 this month.  Patient has been on prednisone as well as Neurontin.  Patient denies any specific injuries.  Patient states she has pain over the patella which radiates to her foot.  Patient states she cannot actively extend her knee.  Assessment & Plan: Visit Diagnoses:  1. Chronic pain of right knee     Plan: Number Elpidio Anis show will send in a prescription for colchicine to help with the arthritic pain as well as help with there is potential for gout component to her symptoms.  Pain  Follow-Up Instructions: No follow-ups on file.   Ortho Exam  Patient is alert, oriented, no adenopathy, well-dressed, normal affect, normal respiratory effort. Used to be difficult for examination patient lacks 10 degrees of active extension.  There is no effusion there is no redness or cellulitis.  There is no mechanical symptoms with internal or external rotation.  Patient has pain to palpation of the medial lateral joint line as well as the patellofemoral joint.  There is mild crepitation with range of motion.  Imaging: XR Knee 1-2 Views Right  Result Date: 07/27/2023 2 view radiographs of the right knee shows mild valgus deformity with no osteophytic bone spurs in any compartment with mild joint space narrowing.  No subcondylar cysts.  No images are attached to the encounter.  Labs: Lab Results  Component Value Date   HGBA1C 4.9 09/28/2021   HGBA1C 5.0 12/27/2019   LABURIC 5.7 07/25/2023   LABURIC 7.6 (H) 02/06/2023    REPTSTATUS 08/18/2021 FINAL 08/16/2021   CULT >=100,000 COLONIES/mL ESCHERICHIA COLI (A) 08/16/2021   LABORGA ESCHERICHIA COLI (A) 08/16/2021     Lab Results  Component Value Date   ALBUMIN 4.7 07/25/2023   ALBUMIN 3.2 (L) 09/29/2022   ALBUMIN 5.2 (H) 09/27/2022    Lab Results  Component Value Date   MG 2.1 09/27/2022   MG 2.3 04/29/2022   MG 2.5 (H) 01/18/2022   Lab Results  Component Value Date   VD25OH 38.1 07/25/2023    No results found for: "PREALBUMIN"    Latest Ref Rng & Units 09/29/2022    5:13 AM 09/28/2022    5:10 AM 09/27/2022    9:16 AM  CBC EXTENDED  WBC 4.0 - 10.5 K/uL 7.7  9.1  13.8   RBC 3.87 - 5.11 MIL/uL 4.66  4.56  5.90   Hemoglobin 12.0 - 15.0 g/dL 19.1  47.8  29.5   HCT 36.0 - 46.0 % 37.7  36.6  46.0   Platelets 150 - 400 K/uL 227  253  395   NEUT# 1.7 - 7.7 K/uL  5.6    Lymph# 0.7 - 4.0 K/uL  2.9       There is no height or weight on file to calculate BMI.  Orders:  Orders Placed This Encounter  Procedures  XR Knee 1-2 Views Right   Meds ordered this encounter  Medications   colchicine 0.6 MG tablet    Sig: Take 1 tablet (0.6 mg total) by mouth daily.    Dispense:  90 tablet    Refill:  3     Procedures: Large Joint Inj: R knee on 07/27/2023 12:48 PM Indications: pain and diagnostic evaluation Details: 22 G 1.5 in needle, anteromedial approach  Arthrogram: No  Medications: 5 mL lidocaine (PF) 1 %; 40 mg methylPREDNISolone acetate 40 MG/ML Outcome: tolerated well, no immediate complications Procedure, treatment alternatives, risks and benefits explained, specific risks discussed. Consent was given by the patient. Immediately prior to procedure a time out was called to verify the correct patient, procedure, equipment, support staff and site/side marked as required. Patient was prepped and draped in the usual sterile fashion.      Clinical Data: No additional findings.  ROS:  All other systems negative, except as noted in the  HPI. Review of Systems  Objective: Vital Signs: There were no vitals taken for this visit.  Specialty Comments:  No specialty comments available.  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   Excessive daytime sleepiness 04/19/2022   Pulmonary emphysema (HCC) 04/19/2022   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HCC) 04/19/2022   Migraine without aura and without status migrainosus, not intractable 02/22/2022   Gross hematuria 09/24/2021   Tonsillopharyngitis 07/28/2020   Acute lower UTI 07/28/2020   Dehydration 05/04/2019   Pancreatitis 05/04/2019   Hypokalemia 08/20/2018   Tobacco abuse 08/20/2018   Leukocytosis 08/20/2018   Alcohol abuse 08/20/2018   Alcoholic pancreatitis 08/20/2018   Abnormal LFTs 08/20/2018   Acute pancreatitis 08/20/2018   Asthma    Hypertension    Past Medical History:  Diagnosis Date   Asthma    Bronchitis    GERD (gastroesophageal reflux disease)    High cholesterol    Hypertension    Pancreatitis    Tobacco abuse     Family History  Problem Relation Age of Onset   Hypertension Mother    Hyperlipidemia Mother    Hypertension Father    Hyperlipidemia Father    Migraines Neg Hx    Headache Neg Hx     Past Surgical History:  Procedure Laterality Date   TRANSURETHRAL RESECTION OF BLADDER TUMOR WITH MITOMYCIN-C N/A 09/24/2021   Procedure: CYSTOSCOPY; CLOT EVACUATION; FULGERATION OF URETHRA;  Surgeon: Jerilee Field, MD;  Location: [facility];  Service: Urology;  Laterality: N/A;   Social History   Occupational History   Not on file  Tobacco Use   Smoking status: Every Day    Current packs/day: 0.50    Types: Cigarettes   Smokeless  tobacco: Never  Vaping Use   Vaping status: Never Used  Substance and Sexual Activity   Alcohol use: Yes    Alcohol/week: 6.0 standard drinks of alcohol    Types: 2 Cans of beer, 4 Shots of liquor per week    Comment: beer   Drug use: No   Sexual activity: Not on file

## 2023-07-27 NOTE — Telephone Encounter (Signed)
Robin Fischer put print option on medication instead of sending it to [facility] Rx for Colchicine

## 2023-08-24 ENCOUNTER — Ambulatory Visit: Payer: Medicaid Other | Admitting: Orthopedic Surgery

## 2023-08-24 ENCOUNTER — Encounter: Payer: Self-pay | Admitting: Orthopedic Surgery

## 2023-08-24 ENCOUNTER — Other Ambulatory Visit (INDEPENDENT_AMBULATORY_CARE_PROVIDER_SITE_OTHER): Payer: Medicaid Other

## 2023-08-24 DIAGNOSIS — M1711 Unilateral primary osteoarthritis, right knee: Secondary | ICD-10-CM

## 2023-08-24 DIAGNOSIS — M5410 Radiculopathy, site unspecified: Secondary | ICD-10-CM

## 2023-08-24 DIAGNOSIS — M79604 Pain in right leg: Secondary | ICD-10-CM

## 2023-08-24 NOTE — Progress Notes (Signed)
Office Visit Note   Patient: Robin Fischer           Date of Birth: [birth year]           MRN: [id number] Visit Date: 08/24/2023              Requested by: Grayce Sessions, NP [address] [place],  [place] [address] PCP: Grayce Sessions, NP  Chief Complaint  Patient presents with   Right Knee - Follow-up   Right Leg - Pain      HPI: Patient is a 52 year old woman who presents with 2 separate issues.  She is status post injection right knee for osteoarthritis of her right knee and she was started on colchicine 0.6 mg a day.  Patient states the injection did help but she feels stiff in her knee.  Patient states she also started developing some burning pain that radiates from down the lateral aspect of the right thigh.  Patient states this has woken her up from sleep 2 times.  She denies any back pain.  Patient states she has taken 2 prednisone Dosepaks without relief of her knee pain.  Assessment & Plan: Visit Diagnoses:  1. Pain in right leg   2. Radicular pain of right lower extremity   3. Unilateral primary osteoarthritis, right knee     Plan: Patient will continue with the colchicine that should help with the knee and back radicular symptoms.  Will set her up for physical therapy for the knee and back.  Reevaluate in 6 weeks.  Patient may require an MRI scan of the lumbar spine.  Follow-Up Instructions: Return in about 2 months (around 10/25/2023).   Ortho Exam  Patient is alert, oriented, no adenopathy, well-dressed, normal affect, normal respiratory effort. Examination patient has a negative straight leg raise on the right.  She does have crepitation with range of motion of the right knee.  There is no focal motor weakness of the right lower extremity.  Radiographs of the right knee shows symmetric joint space in both knees without varus or valgus malalignment.  Radiographs of the lumbar spine shows advanced degenerative changes at L4-5 and L5-S1.  Imaging: XR  Lumbar Spine 2-3 Views Result Date: 08/24/2023 2 view radiographs of the lumbar spine shows some calcification of the aorta without aneurysm.  She does have degenerative disc disease and osteophytic bone spurs at L4-5 and L5-S1.  No images are attached to the encounter.  Labs: Lab Results  Component Value Date   HGBA1C 4.9 09/28/2021   HGBA1C 5.0 12/27/2019   LABURIC 5.7 07/25/2023   LABURIC 7.6 (H) 02/06/2023   REPTSTATUS 08/18/2021 FINAL 08/16/2021   CULT >=100,000 COLONIES/mL ESCHERICHIA COLI (A) 08/16/2021   LABORGA ESCHERICHIA COLI (A) 08/16/2021     Lab Results  Component Value Date   ALBUMIN 4.7 07/25/2023   ALBUMIN 3.2 (L) 09/29/2022   ALBUMIN 5.2 (H) 09/27/2022    Lab Results  Component Value Date   MG 2.1 09/27/2022   MG 2.3 04/29/2022   MG 2.5 (H) 01/18/2022   Lab Results  Component Value Date   VD25OH 38.1 07/25/2023    No results found for: "PREALBUMIN"    Latest Ref Rng & Units 09/29/2022    5:13 AM 09/28/2022    5:10 AM 09/27/2022    9:16 AM  CBC EXTENDED  WBC 4.0 - 10.5 K/uL 7.7  9.1  13.8   RBC 3.87 - 5.11 MIL/uL 4.66  4.56  5.90   Hemoglobin 12.0 -  15.0 g/dL 16.6  06.3  01.6   HCT 36.0 - 46.0 % 37.7  36.6  46.0   Platelets 150 - 400 K/uL 227  253  395   NEUT# 1.7 - 7.7 K/uL  5.6    Lymph# 0.7 - 4.0 K/uL  2.9       There is no height or weight on file to calculate BMI.  Orders:  Orders Placed This Encounter  Procedures   XR Lumbar Spine 2-3 Views   Ambulatory referral to Physical Therapy   No orders of the defined types were placed in this encounter.    Procedures: No procedures performed  Clinical Data: No additional findings.  ROS:  All other systems negative, except as noted in the HPI. Review of Systems  Objective: Vital Signs: There were no vitals taken for this visit.  Specialty Comments:  No specialty comments available.  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   Excessive daytime sleepiness 04/19/2022   Pulmonary emphysema (HCC) 04/19/2022   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HCC) 04/19/2022   Migraine without aura and without status migrainosus, not intractable 02/22/2022   Gross hematuria 09/24/2021   Tonsillopharyngitis 07/28/2020   Acute lower UTI 07/28/2020   Dehydration 05/04/2019   Pancreatitis 05/04/2019   Hypokalemia 08/20/2018   Tobacco abuse 08/20/2018   Leukocytosis 08/20/2018   Alcohol abuse 08/20/2018   Alcoholic pancreatitis 08/20/2018   Abnormal LFTs 08/20/2018   Acute pancreatitis 08/20/2018   Asthma    Hypertension    Past Medical History:  Diagnosis Date   Asthma    Bronchitis    GERD (gastroesophageal reflux disease)    High cholesterol    Hypertension    Pancreatitis    Tobacco abuse     Family History  Problem Relation Age of Onset   Hypertension Mother    Hyperlipidemia Mother    Hypertension Father    Hyperlipidemia Father    Migraines Neg Hx    Headache Neg Hx     Past Surgical History:  Procedure Laterality Date   TRANSURETHRAL RESECTION OF BLADDER TUMOR WITH MITOMYCIN-C N/A 09/24/2021   Procedure: CYSTOSCOPY; CLOT EVACUATION; FULGERATION OF URETHRA;  Surgeon: Jerilee Field, MD;  Location: [facility];  Service: Urology;  Laterality: N/A;   Social History   Occupational History   Not on file  Tobacco Use   Smoking status: Every Day    Current packs/day: 0.50    Types: Cigarettes   Smokeless tobacco: Never  Vaping Use   Vaping status: Never Used  Substance and Sexual Activity   Alcohol use: Yes    Alcohol/week: 6.0 standard drinks of alcohol    Types: 2 Cans of beer, 4 Shots of liquor per week    Comment: beer   Drug use: No   Sexual  activity: Not on file

## 2023-08-28 ENCOUNTER — Other Ambulatory Visit (INDEPENDENT_AMBULATORY_CARE_PROVIDER_SITE_OTHER): Payer: Self-pay | Admitting: Primary Care

## 2023-08-29 NOTE — Telephone Encounter (Signed)
Will forward to provider  

## 2023-08-29 NOTE — Telephone Encounter (Signed)
Requested medication (s) are due for refill today - yes  Requested medication (s) are on the active medication list -unsure  Future visit scheduled -no  Last refill: 06/30/23 [id number] 1RF  Notes to clinic: non delegated Rx- check requested Rx- may need Eucerin cream  Requested Prescriptions  Pending Prescriptions Disp Refills   triamcinolone cream (KENALOG) 0.1 % [Pharmacy Med Name: TRIAMCINOLONE ACETONIDE 0.1 % EXTERNAL CREAM] 30 g     Sig: APPLY TO AFFECTED AREA(S) THREE TIMES A DAY     Not Delegated - Dermatology:  Corticosteroids Failed - 08/29/2023  9:01 AM      Failed - This refill cannot be delegated      Passed - Valid encounter within last 12 months    Recent Outpatient Visits           1 month ago Pain and swelling of right knee   [place] [facility] Grayce Sessions, NP   2 months ago Primary hypertension   [place] [facility] Grayce Sessions, NP   3 months ago Encounter for immunization   [place] [facility] Grayce Sessions, NP   6 months ago Pain and swelling of right knee   [place] [facility] Grayce Sessions, NP   9 months ago Vaginal irritation   [place] [facility] Grayce Sessions, NP       Future Appointments             In 1 month Lajoyce Corners, Randa Evens, MD [facility]               Requested Prescriptions  Pending Prescriptions Disp Refills   triamcinolone cream (KENALOG) 0.1 % [Pharmacy Med Name: TRIAMCINOLONE ACETONIDE 0.1 % EXTERNAL CREAM] 30 g     Sig: APPLY TO AFFECTED AREA(S) THREE TIMES A DAY     Not Delegated - Dermatology:  Corticosteroids Failed - 08/29/2023  9:01 AM      Failed - This refill cannot be delegated      Passed - Valid encounter within last 12 months    Recent Outpatient Visits           1 month ago Pain and swelling of right knee   [place] [facility] Grayce Sessions, NP   2 months ago Primary hypertension   [place] [facility] Grayce Sessions, NP   3 months ago Encounter for immunization   [place] [facility] Grayce Sessions, NP   6 months ago Pain and swelling of right knee   [place] [facility] Grayce Sessions, NP   9 months ago Vaginal irritation   [place] [facility] Grayce Sessions, NP       Future Appointments             In 1 month Nadara Mustard, MD [facility]

## 2023-09-19 ENCOUNTER — Ambulatory Visit: Payer: Medicaid Other | Admitting: Physical Therapy

## 2023-09-22 ENCOUNTER — Ambulatory Visit: Payer: Medicaid Other | Attending: Orthopedic Surgery | Admitting: Physical Therapy

## 2023-09-22 ENCOUNTER — Encounter: Payer: Self-pay | Admitting: Physical Therapy

## 2023-09-22 ENCOUNTER — Other Ambulatory Visit: Payer: Self-pay

## 2023-09-22 DIAGNOSIS — M1711 Unilateral primary osteoarthritis, right knee: Secondary | ICD-10-CM | POA: Insufficient documentation

## 2023-09-22 DIAGNOSIS — M6281 Muscle weakness (generalized): Secondary | ICD-10-CM | POA: Insufficient documentation

## 2023-09-22 DIAGNOSIS — M5410 Radiculopathy, site unspecified: Secondary | ICD-10-CM | POA: Insufficient documentation

## 2023-09-22 DIAGNOSIS — M5459 Other low back pain: Secondary | ICD-10-CM | POA: Diagnosis present

## 2023-09-22 DIAGNOSIS — M79604 Pain in right leg: Secondary | ICD-10-CM | POA: Insufficient documentation

## 2023-09-22 DIAGNOSIS — M25561 Pain in right knee: Secondary | ICD-10-CM | POA: Insufficient documentation

## 2023-09-22 DIAGNOSIS — G8929 Other chronic pain: Secondary | ICD-10-CM | POA: Diagnosis present

## 2023-09-22 NOTE — Therapy (Signed)
OUTPATIENT PHYSICAL THERAPY LOWER EXTREMITY EVALUATION  Patient Name: Robin Fischer MRN: [id number] DOB:[birth year], 53 y.o., female Today's Date: 09/23/2023   PT End of Session - 09/23/23 1026     Visit Number 1    Number of Visits --   1-2x/week   Date for PT Re-Evaluation 11/18/23    Authorization Type [employer] - FOTO    PT Start Time 1045    PT Stop Time 1126    PT Time Calculation (min) 41 min             Past Medical History:  Diagnosis Date   Asthma    Bronchitis    GERD (gastroesophageal reflux disease)    High cholesterol    Hypertension    Pancreatitis    Tobacco abuse    Past Surgical History:  Procedure Laterality Date   TRANSURETHRAL RESECTION OF BLADDER TUMOR WITH MITOMYCIN-C N/A 09/24/2021   Procedure: CYSTOSCOPY; CLOT EVACUATION; FULGERATION OF URETHRA;  Surgeon: Jerilee Field, MD;  Location: [facility];  Service: Urology;  Laterality: N/A;   Patient Active Problem List   Diagnosis Date Noted   Hyponatremia 09/28/2022   Hepatitis 09/28/2022   AKI (acute kidney injury) (HCC) 09/28/2022   Acute gastritis 09/27/2022   Atrial fibrillation (HCC) 09/27/2022   Chronic migraine with aura 04/19/2022   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   Excessive daytime sleepiness 04/19/2022   Pulmonary emphysema (HCC) 04/19/2022   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HCC) 04/19/2022   Migraine without aura and without status migrainosus, not intractable 02/22/2022   Gross hematuria 09/24/2021   Tonsillopharyngitis 07/28/2020   Acute lower UTI 07/28/2020   Dehydration 05/04/2019   Pancreatitis 05/04/2019   Hypokalemia 08/20/2018   Tobacco abuse 08/20/2018   Leukocytosis 08/20/2018   Alcohol abuse 08/20/2018   Alcoholic pancreatitis 08/20/2018   Abnormal LFTs 08/20/2018   Acute pancreatitis 08/20/2018   Asthma    Hypertension     PCP: Grayce Sessions, NP  REFERRING PROVIDER: Nadara Mustard, MD  THERAPY DIAG:   Other low back pain - Plan: PT plan of care cert/re-cert  Chronic pain of right knee - Plan: PT plan of care cert/re-cert  Muscle weakness - Plan: PT plan of care cert/re-cert  REFERRING DIAG: Pain in right leg [M79.604], Radicular pain of right lower extremity [M54.10], Unilateral primary osteoarthritis, right knee [M17.11]   Rationale for Evaluation and Treatment:  Rehabilitation  SUBJECTIVE:  PERTINENT PAST HISTORY:  History of ETH abuse, Afib (pt denies), HTN      PRECAUTIONS: None  WEIGHT BEARING RESTRICTIONS No  FALLS:  Has patient fallen in last 6 months? No, Number of falls: 0  MOI/History of condition:  Onset date: Chronic R knee pain, with R thigh pain for about 1 month  SUBJECTIVE STATEMENT  Robin Fischer is a 53 y.o. female who presents to clinic with chief complaint of chronic R knee pain and fairly recent R thigh pain which she describes as "burning".  She has some LBP but this is minimal and chronic.  She has had some relief from R knee injections but this is only for a limited time.  She endorses N/T in the R LE intermittently, and mainly at night.  There are times her R LE feels weak.  Denies BB changes and saddle anesthesia.  Denies any injury or trauma.  Sleeps on side d/t breathing issues on back.   Red flags:  denies   Pain:  Are you having pain? Yes Pain location:  R knee pain NPRS scale:  2/10 to 10/10 Aggravating factors: transfers, standing, walking Relieving factors: rest Pain description: sharp and aching Stage: Chronic 24 hour pattern: stiff first thing in the morning   Pain location: Lateral thigh pain NPRS scale:  0/10 to 10/10 Aggravating factors: typically at night, when relaxing Relieving factors: movement, changes in positon Pain description: burning Stage: Subacute 24 hour pattern: worse at night   Occupation: NA  Assistive Device: SPC  Hand Dominance: NA  Patient Goals/Specific Activities: reduce pain   OBJECTIVE:    DIAGNOSTIC FINDINGS:  X-ray shows DDD lumbar spine L4-S1  GENERAL OBSERVATION/GAIT: Significant antagic gait d/t R knee pain  SENSATION: Light touch: Deficits R toe n/t reported with lumbar ext  MUSCLE LENGTH: Hamstrings: Right subtle restriction; Left no restriction Maisie Fus test: Right significant restriction; Left significant restriction  LE MMT:  MMT Right (Eval) Left (Eval)  Hip flexion (L2, L3) 3+* 4+  Knee extension (L3) 3+* 4+  Knee flexion 3+* 4+  Hip abduction 2+* 3+  Hip extension    Hip external rotation    Hip internal rotation    Hip adduction    Ankle dorsiflexion (L4) c   Ankle plantarflexion (S1) c   Ankle inversion    Ankle eversion    Great Toe ext (L5) c   Grossly     (Blank rows = not tested, score listed is out of 5 possible points.  N = WNL, D = diminished, C = clear for gross weakness with myotome testing, * = concordant pain with testing)  LE ROM:  ROM Right (Eval) Left (Eval)  Hip flexion    Hip extension    Hip abduction    Hip adduction    Hip internal rotation    Hip external rotation    Knee extension Lacking 10 n  Knee flexion WFL n  Ankle dorsiflexion    Ankle plantarflexion    Ankle inversion    Ankle eversion     (Blank rows = not tested, N = WNL, * = concordant pain with testing)  Functional Tests  Eval    30'' STS: 6x  UE used? Y    10 m max gait speed: 9'', 1.1 m/s, AD: n with pain and limp                                                      SPECIAL TESTS:  SLR: (+) R Slump: (+) R   PATIENT SURVEYS:  FOTO knee 36 -> 49, low back 36 -> 49   TODAY'S TREATMENT: Therapeutic Exercise: Creating, reviewing, and completing below HEP   PATIENT EDUCATION:  POC, diagnosis, prognosis, HEP, and outcome measures.  Pt educated via explanation, demonstration, and handout (HEP).  Pt confirms understanding verbally.   HOME EXERCISE PROGRAM: Access Code: [email or web address] URL:  [email or web address] Date: 09/22/2023 Prepared by: Alphonzo Severance  Exercises - Supine Quad Set  - 3 x daily - 7 x weekly - 2 sets - 10 reps - 10 second hold - Supine Lower Trunk Rotation  - 1 x daily - 7 x weekly - 1 sets - 20 reps - 3 hold - Supine Hip Adduction Isometric with Ball  - 1 x daily - 7 x weekly - 2 sets - 10 reps - 10'' hold - Hooklying Isometric Clamshell  - 1 x  daily - 7 x weekly - 3 sets - 10 reps - Bridge  - 1 x daily - 7 x weekly - 3 sets - 10 reps - 5-10'' hold  Treatment priorities   Eval        Hip ext rom        Core/hip/quad strengthening        Knee ext ROM as able        General strengthening         OA education          ASSESSMENT:  CLINICAL IMPRESSION: Robin Fischer is a 53 y.o. female who presents to clinic with signs and sxs consistent with R knee pain with concurrent R sided nerve root irritation.  Knee pain consistent with chronic degenerative changes.  Pt (+) for neural tension with no significant neurological weakness noted on exam.  Pt will benefit from skilled therapy to address relevant deficits and improve comfort in daily tasks    OBJECTIVE IMPAIRMENTS: Pain, lumbar/hip/knee ROM, LE/hip/core strength, gait, balance  ACTIVITY LIMITATIONS: walking, standing, transfers, bending, lifting, squatting, sleeping  PERSONAL FACTORS: See medical history and pertinent history   REHAB POTENTIAL: Good  CLINICAL DECISION MAKING: Stable/uncomplicated  EVALUATION COMPLEXITY: Moderate   GOALS:   SHORT TERM GOALS: Target date: 10/21/2023   Robin Fischer will be >75% HEP compliant to improve carryover between sessions and facilitate independent management of condition  Evaluation: ongoing Goal status: INITIAL   LONG TERM GOALS: Target date: 11/18/2023   Robin Fischer will improve FOTO score to 49 as a proxy for functional improvement  Evaluation/Baseline: 36 knee and back Goal status: INITIAL    2.  Robin Fischer will self report >/= 50% decrease  in pain from evaluation to improve function in daily tasks  Evaluation/Baseline: 10/10 max knee and thigh pain Goal status: INITIAL   3.  Robin Fischer will improve 30'' STS (MCID 2) to >/= 9x (w/ UE?: N) to show improved LE strength and improved transfers   Evaluation/Baseline: 6x  w/ UE? Y Goal status: INITIAL   4.  Robin Fischer will improve 10 meter max gait speed to 1.37 m/s (.1 m/s MCID) to show functional improvement in ambulation   Evaluation/Baseline: 1.1 m/s Goal status: INITIAL   [place]:     5.  Robin Fischer will reports significant improvement in ability to sleep through the night, not limited by thigh pain  Evaluation/Baseline: significantly limited Goal status: INITIAL  6.  Robin Fischer will improve the following MMTs to >/= 4/5 to show improvement in strength:   Evaluation/Baseline:  LE MMT:  MMT Right (Eval) Left (Eval)  Hip flexion (L2, L3) 3+* 4+  Knee extension (L3) 3+* 4+  Knee flexion 3+* 4+  Hip abduction 2+* 3+  Hip extension    Hip external rotation    Hip internal rotation    Hip adduction    Ankle dorsiflexion (L4) c   Ankle plantarflexion (S1) c   Ankle inversion    Ankle eversion    Great Toe ext (L5) c   Grossly     (Blank rows = not tested, score listed is out of 5 possible points.  N = WNL, D = diminished, C = clear for gross weakness with myotome testing, * = concordant pain with testing)  Goal status: INITIAL  PLAN: PT FREQUENCY: 1-2x/week  PT DURATION: 8 weeks  PLANNED INTERVENTIONS:  97164- PT Re-evaluation, 97110-Therapeutic exercises, 97530- Therapeutic activity, [id number]- Neuromuscular re-education, 97535- Self Care, 16109- Manual therapy, [id number]- Gait training, [id number]- Aquatic Therapy, [phone]- Electrical  stimulation (manual), 16109- Vasopneumatic device, [id number]- Traction (mechanical), 60454- Ionotophoresis 4mg /ml Dexamethasone, Taping, Dry Needling, Joint manipulation, and Spinal manipulation.   Alphonzo Severance PT, DPT 09/23/2023, 12:11 PM

## 2023-10-04 ENCOUNTER — Ambulatory Visit: Payer: Medicaid Other

## 2023-10-11 ENCOUNTER — Encounter: Payer: Self-pay | Admitting: Physical Therapy

## 2023-10-11 ENCOUNTER — Ambulatory Visit: Payer: Medicaid Other | Attending: Orthopedic Surgery | Admitting: Physical Therapy

## 2023-10-11 DIAGNOSIS — M25561 Pain in right knee: Secondary | ICD-10-CM | POA: Insufficient documentation

## 2023-10-11 DIAGNOSIS — M6281 Muscle weakness (generalized): Secondary | ICD-10-CM

## 2023-10-11 DIAGNOSIS — G8929 Other chronic pain: Secondary | ICD-10-CM | POA: Diagnosis present

## 2023-10-11 DIAGNOSIS — M5459 Other low back pain: Secondary | ICD-10-CM

## 2023-10-11 NOTE — Therapy (Signed)
 OUTPATIENT PHYSICAL THERAPY DAILY NOTE  Patient Name: Robin Fischer MRN: [id number] DOB:1971-02-21, 53 y.o., female Today's Date: 10/11/2023   PT End of Session - 10/11/23 0956     Visit Number 2    Number of Visits --   1-2x/week   Date for PT Re-Evaluation 11/18/23    Authorization Type [employer] - FOTO    PT Start Time 1000    PT Stop Time 1042    PT Time Calculation (min) 42 min             Past Medical History:  Diagnosis Date   Asthma    Bronchitis    GERD (gastroesophageal reflux disease)    High cholesterol    Hypertension    Pancreatitis    Tobacco abuse    Past Surgical History:  Procedure Laterality Date   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 N/A 09/24/2021   Procedure: CYSTOSCOPY; CLOT EVACUATION; FULGERATION OF URETHRA;  Surgeon: Nieves Cough, MD;  Location: [facility];  Service: Urology;  Laterality: N/A;   Patient Active Problem List   Diagnosis Date Noted   Hyponatremia 09/28/2022   Hepatitis 09/28/2022   AKI (acute kidney injury) (HCC) 09/28/2022   Acute gastritis 09/27/2022   Atrial fibrillation (HCC) 09/27/2022   Chronic migraine with aura 04/19/2022   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   Excessive daytime sleepiness 04/19/2022   Pulmonary emphysema (HCC) 04/19/2022   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HCC) 04/19/2022   Migraine without aura and without status migrainosus, not intractable 02/22/2022   Gross hematuria 09/24/2021   Tonsillopharyngitis 07/28/2020   Acute lower UTI 07/28/2020   Dehydration 05/04/2019   Pancreatitis 05/04/2019   Hypokalemia 08/20/2018   Tobacco abuse 08/20/2018   Leukocytosis 08/20/2018   Alcohol abuse 08/20/2018   Alcoholic pancreatitis 08/20/2018   Abnormal LFTs 08/20/2018   Acute pancreatitis 08/20/2018   Asthma    Hypertension     PCP: [name], NP  REFERRING PROVIDER: [name], NP  THERAPY DIAG:  Other low  back pain  Chronic pain of right knee  Muscle weakness  REFERRING DIAG: Pain in right leg [M79.604], Radicular pain of right lower extremity [M54.10], Unilateral primary osteoarthritis, right knee [M17.11]   Rationale for Evaluation and Treatment:  Rehabilitation  SUBJECTIVE:  PERTINENT PAST HISTORY:  History of ETH abuse, Afib (pt denies), HTN      PRECAUTIONS: None  WEIGHT BEARING RESTRICTIONS No  FALLS:  Has patient fallen in last 6 months? No, Number of falls: 0  MOI/History of condition:  Onset date: Chronic R knee pain, with R thigh pain for about 1 month  SUBJECTIVE STATEMENT  10/11/2023: Pt reports that her pain is variable.  She has days where it is difficult for her to get out of bed.  If she does a lot of activity one day, it is difficult for her to do anything the next day.  Eval: Robin Fischer is a 53 y.o. female who presents to clinic with chief complaint of chronic R knee pain and fairly recent R thigh pain which she describes as burning.  She has some LBP but this is minimal and chronic.  She has had some relief from R knee injections but this is only for a limited time.  She endorses N/T in the R LE intermittently, and mainly at night.  There are times her R LE feels weak.  Denies BB changes and saddle anesthesia.  Denies any injury or trauma.  Sleeps  on side d/t breathing issues on back.   Red flags:  denies   Pain:  Are you having pain? Yes Pain location: R knee pain NPRS scale:  2/10 to 10/10 Aggravating factors: transfers, standing, walking Relieving factors: rest Pain description: sharp and aching Stage: Chronic 24 hour pattern: stiff first thing in the morning   Pain location: Lateral thigh pain NPRS scale:  0/10 to 10/10 Aggravating factors: typically at night, when relaxing Relieving factors: movement, changes in positon Pain description: burning Stage: Subacute 24 hour pattern: worse at night   Occupation: NA  Assistive Device:  SPC  Hand Dominance: NA  Patient Goals/Specific Activities: reduce pain   OBJECTIVE:   DIAGNOSTIC FINDINGS:  X-ray shows DDD lumbar spine L4-S1  GENERAL OBSERVATION/GAIT: Significant antagic gait d/t R knee pain  SENSATION: Light touch: Deficits R toe n/t reported with lumbar ext  MUSCLE LENGTH: Hamstrings: Right subtle restriction; Left no restriction Robin Fischer: Right significant restriction; Left significant restriction  LE MMT:  MMT Right (Eval) Left (Eval)  Hip flexion (L2, L3) 3+* 4+  Knee extension (L3) 3+* 4+  Knee flexion 3+* 4+  Hip abduction 2+* 3+  Hip extension    Hip external rotation    Hip internal rotation    Hip adduction    Ankle dorsiflexion (L4) c   Ankle plantarflexion (S1) c   Ankle inversion    Ankle eversion    Great Toe ext (L5) c   Grossly     (Blank rows = not tested, score listed is out of 5 possible points.  N = WNL, D = diminished, C = clear for gross weakness with myotome testing, * = concordant pain with testing)  LE ROM:  ROM Right (Eval) Left (Eval)  Hip flexion    Hip extension    Hip abduction    Hip adduction    Hip internal rotation    Hip external rotation    Knee extension Lacking 10 n  Knee flexion WFL n  Ankle dorsiflexion    Ankle plantarflexion    Ankle inversion    Ankle eversion     (Blank rows = not tested, N = WNL, * = concordant pain with testing)  Functional Tests  Eval    30'' STS: 6x  UE used? Y    10 m max gait speed: 9'', 1.1 m/s, AD: n with pain and limp                                                      SPECIAL TESTS:  SLR: (+) R Slump: (+) R   PATIENT SURVEYS:  FOTO knee 36 -> 49, low back 36 -> 49   TODAY'S TREATMENT:  [facility] Adult PT Treatment  10/11/2023:  Therapeutic Exercise:  nu-step L5 5m while taking subjective and planning session with patient Single knee to chest - x45'' ea - with R knee flexion some n/t in R toes Supine HS curl on pball -  20x Bridge on ball under - short lever - 2x10 - very difficult Supine clam - GTB - 2x10 Hip adduction squeeze - 5'' 2x10 Pt education: benefits of progressive exercise and need to do this consistently R sciatic nerve glide - 2x15 SAQ - 3x10  HOME EXERCISE PROGRAM: Access Code: [email or web address] URL: [email or web address] Date: 09/22/2023 Prepared by: Robin Fischer  Exercises - Supine Quad Set  - 3 x daily - 7 x weekly - 2 sets - 10 reps - 10 second hold - Supine Lower Trunk Rotation  - 1 x daily - 7 x weekly - 1 sets - 20 reps - 3 hold - Supine Hip Adduction Isometric with Ball  - 1 x daily - 7 x weekly - 2 sets - 10 reps - 10'' hold - Hooklying Isometric Clamshell  - 1 x daily - 7 x weekly - 3 sets - 10 reps - Bridge  - 1 x daily - 7 x weekly - 3 sets - 10 reps - 5-10'' hold  Treatment priorities   Eval        Hip ext rom        Core/hip/quad strengthening        Knee ext ROM as able        General strengthening         OA education          ASSESSMENT:  CLINICAL IMPRESSION:  10/11/2023: Pt presents to clinic with continued pain and antalgic gait.  Pt has been minimally HEP compliant d/t general pain.  We discussed the need for consistent exercise at home, particularly since she has minimal appts in clinic, to see if therapy will be effective.  Discussed the benefits of strength training and ROM.  Pt states she will try to complete the HEP at least every other day.  EvalAdasia Fischer is a 53 y.o. female who presents to clinic with signs and sxs consistent with R knee pain with concurrent R sided nerve root irritation.  Knee pain consistent with chronic degenerative changes.  Pt (+) for neural tension with no significant neurological weakness noted on exam.  Pt will benefit from skilled therapy to address relevant deficits and improve comfort in daily tasks    OBJECTIVE IMPAIRMENTS: Pain, lumbar/hip/knee ROM, LE/hip/core strength, gait, balance  ACTIVITY LIMITATIONS:  walking, standing, transfers, bending, lifting, squatting, sleeping  PERSONAL FACTORS: See medical history and pertinent history   REHAB POTENTIAL: Good  CLINICAL DECISION MAKING: Stable/uncomplicated  EVALUATION COMPLEXITY: Moderate   GOALS:   SHORT TERM GOALS: Target date: 10/21/2023   Robin Fischer will be >75% HEP compliant to improve carryover between sessions and facilitate independent management of condition  Evaluation: ongoing Goal status: INITIAL   LONG TERM GOALS: Target date: 11/18/2023   Robin Fischer will improve FOTO score to 49 as a proxy for functional improvement  Evaluation/Baseline: 36 knee and back Goal status: INITIAL    2.  Robin Fischer will self report >/= 50% decrease in pain from evaluation to improve function in daily tasks  Evaluation/Baseline: 10/10 max knee and thigh pain Goal status: INITIAL   3.  Robin Fischer will improve 30'' STS (MCID 2) to >/= 9x (w/ UE?: N) to show improved LE strength and improved transfers   Evaluation/Baseline: 6x  w/ UE? Y Goal status: INITIAL   4.  Robin Fischer will improve 10 meter max gait speed to 1.37 m/s (.1 m/s MCID) to show functional improvement in ambulation   Evaluation/Baseline: 1.1 m/s Goal status: INITIAL   Norms:     5.  Robin Fischer will reports significant improvement in ability to sleep through the night, not limited by thigh pain  Evaluation/Baseline: significantly limited Goal status: INITIAL  6.  Robin Fischer will improve the following MMTs to >/= 4/5 to show improvement in strength:   Evaluation/Baseline:  LE MMT:  MMT Right (Eval) Left (Eval)  Hip flexion (L2, L3)  3+* 4+  Knee extension (L3) 3+* 4+  Knee flexion 3+* 4+  Hip abduction 2+* 3+  Hip extension    Hip external rotation    Hip internal rotation    Hip adduction    Ankle dorsiflexion (L4) c   Ankle plantarflexion (S1) c   Ankle inversion    Ankle eversion    Great Toe ext (L5) c   Grossly     (Blank rows = not tested, score listed is out of  5 possible points.  N = WNL, D = diminished, C = clear for gross weakness with myotome testing, * = concordant pain with testing)  Goal status: INITIAL  PLAN: PT FREQUENCY: 1-2x/week  PT DURATION: 8 weeks  PLANNED INTERVENTIONS:  97164- PT Re-evaluation, 97110-Therapeutic exercises, 97530- Therapeutic activity, [id number]- Neuromuscular re-education, 97535- Self Care, [id number]- Manual therapy, [id number]- Gait training, [id number]- Aquatic Therapy, [id number]- Electrical stimulation (manual), [id number]- Vasopneumatic device, [id number]- Traction (mechanical), [id number]- Ionotophoresis 4mg /ml Dexamethasone , Taping, Dry Needling, Joint manipulation, and Spinal manipulation.   Robin Fischer PT, DPT 10/11/2023, 10:43 AM

## 2023-10-12 ENCOUNTER — Ambulatory Visit: Payer: Medicaid Other | Admitting: Orthopedic Surgery

## 2023-10-18 ENCOUNTER — Telehealth: Payer: Self-pay

## 2023-10-18 ENCOUNTER — Ambulatory Visit: Payer: Medicaid Other

## 2023-10-18 NOTE — Telephone Encounter (Signed)
Spoke with patient. She forgot about today's appointment and is rescheduled for next visit after seeing MD. - MJ

## 2023-10-26 ENCOUNTER — Ambulatory Visit: Payer: Medicaid Other | Admitting: Orthopedic Surgery

## 2023-10-31 ENCOUNTER — Encounter: Payer: Self-pay | Admitting: Orthopedic Surgery

## 2023-10-31 ENCOUNTER — Ambulatory Visit: Payer: Medicaid Other | Admitting: Orthopedic Surgery

## 2023-10-31 DIAGNOSIS — M5410 Radiculopathy, site unspecified: Secondary | ICD-10-CM | POA: Diagnosis not present

## 2023-10-31 DIAGNOSIS — M79604 Pain in right leg: Secondary | ICD-10-CM

## 2023-10-31 NOTE — Progress Notes (Signed)
 Office Visit Note   Patient: Robin Fischer           Date of Birth: 1971/02/26           MRN: [id number] Visit Date: 10/31/2023              Requested by: Grayce Sessions, NP [address] [place],  [place] [address] PCP: Grayce Sessions, NP  Chief Complaint  Patient presents with   Right Knee - Follow-up   Lower Back - Follow-up      HPI: Patient is a 53 year old woman who is seen in follow-up for her lower back and right knee.  Patient states she cannot straighten out her knee fully she states that therapy has made her knee feel worse.  Assessment & Plan: Visit Diagnoses:  1. Pain in right leg   2. Radicular pain of right lower extremity     Plan: She will hold on physical therapy.  We will place an order for an MRI scan right knee.  Continue with the colchicine 1 a day.  Follow-Up Instructions: Return if symptoms worsen or fail to improve.   Ortho Exam  Patient is alert, oriented, no adenopathy, well-dressed, normal affect, normal respiratory effort. Examination patient has crepitation range of motion of the right knee she lacks 10 degrees to full extension.  Clausing cruciates are stable there is no effusion.  Patient has a negative straight leg raise no focal motor weakness.  Imaging: No results found. No images are attached to the encounter.  Labs: Lab Results  Component Value Date   HGBA1C 4.9 09/28/2021   HGBA1C 5.0 12/27/2019   LABURIC 5.7 07/25/2023   LABURIC 7.6 (H) 02/06/2023   REPTSTATUS 08/18/2021 FINAL 08/16/2021   CULT >=100,000 COLONIES/mL ESCHERICHIA COLI (A) 08/16/2021   LABORGA ESCHERICHIA COLI (A) 08/16/2021     Lab Results  Component Value Date   ALBUMIN 4.7 07/25/2023   ALBUMIN 3.2 (L) 09/29/2022   ALBUMIN 5.2 (H) 09/27/2022    Lab Results  Component Value Date   MG 2.1 09/27/2022   MG 2.3 04/29/2022   MG 2.5 (H) 01/18/2022   Lab Results  Component Value Date   VD25OH 38.1 07/25/2023    No results found for:  "PREALBUMIN"    Latest Ref Rng & Units 09/29/2022    5:13 AM 09/28/2022    5:10 AM 09/27/2022    9:16 AM  CBC EXTENDED  WBC 4.0 - 10.5 K/uL 7.7  9.1  13.8   RBC 3.87 - 5.11 MIL/uL 4.66  4.56  5.90   Hemoglobin 12.0 - 15.0 g/dL 19.1  47.8  29.5   HCT 36.0 - 46.0 % 37.7  36.6  46.0   Platelets 150 - 400 K/uL 227  253  395   NEUT# 1.7 - 7.7 K/uL  5.6    Lymph# 0.7 - 4.0 K/uL  2.9       There is no height or weight on file to calculate BMI.  Orders:  No orders of the defined types were placed in this encounter.  No orders of the defined types were placed in this encounter.    Procedures: No procedures performed  Clinical Data: No additional findings.  ROS:  All other systems negative, except as noted in the HPI. Review of Systems  Objective: Vital Signs: There were no vitals taken for this visit.  Specialty Comments:  No specialty comments available.  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   Excessive daytime sleepiness 04/19/2022   Pulmonary emphysema (HCC) 04/19/2022   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HCC) 04/19/2022   Migraine without aura and without status migrainosus, not intractable 02/22/2022   Gross hematuria 09/24/2021   Tonsillopharyngitis 07/28/2020   Acute lower UTI 07/28/2020   Dehydration 05/04/2019   Pancreatitis 05/04/2019   Hypokalemia 08/20/2018   Tobacco abuse 08/20/2018   Leukocytosis 08/20/2018   Alcohol abuse 08/20/2018   Alcoholic pancreatitis 08/20/2018   Abnormal LFTs 08/20/2018   Acute pancreatitis 08/20/2018   Asthma    Hypertension    Past Medical History:  Diagnosis Date   Asthma    Bronchitis    GERD (gastroesophageal  reflux disease)    High cholesterol    Hypertension    Pancreatitis    Tobacco abuse     Family History  Problem Relation Age of Onset   Hypertension Mother    Hyperlipidemia Mother    Hypertension Father    Hyperlipidemia Father    Migraines Neg Hx    Headache Neg Hx     Past Surgical History:  Procedure Laterality Date   TRANSURETHRAL RESECTION OF BLADDER TUMOR WITH MITOMYCIN-C N/A 09/24/2021   Procedure: CYSTOSCOPY; CLOT EVACUATION; FULGERATION OF URETHRA;  Surgeon: Jerilee Field, MD;  Location: [facility];  Service: Urology;  Laterality: N/A;   Social History   Occupational History   Not on file  Tobacco Use   Smoking status: Every Day    Current packs/day: 0.50    Types: Cigarettes   Smokeless tobacco: Never  Vaping Use   Vaping status: Never Used  Substance and Sexual Activity   Alcohol use: Yes    Alcohol/week: 6.0 standard drinks of alcohol    Types: 2 Cans of beer, 4 Shots of liquor per week    Comment: beer   Drug use: No   Sexual activity: Not on file

## 2023-11-01 ENCOUNTER — Ambulatory Visit: Payer: Medicaid Other | Admitting: Physical Therapy

## 2023-11-01 ENCOUNTER — Other Ambulatory Visit (INDEPENDENT_AMBULATORY_CARE_PROVIDER_SITE_OTHER): Payer: Self-pay | Admitting: Primary Care

## 2023-11-01 DIAGNOSIS — Z760 Encounter for issue of repeat prescription: Secondary | ICD-10-CM

## 2023-11-02 ENCOUNTER — Encounter: Payer: Self-pay | Admitting: Orthopedic Surgery

## 2023-11-02 NOTE — Telephone Encounter (Signed)
 Requested medication (s) are due for refill today - yes  Requested medication (s) are on the active medication list -yes  Future visit scheduled -no  Last refill: 09/01/23 30g 1RF  Notes to clinic: non delegated Rx  Requested Prescriptions  Pending Prescriptions Disp Refills   triamcinolone cream (KENALOG) 0.1 % [Pharmacy Med Name: TRIAMCINOLONE ACETONIDE 0.1 % EXTERNAL CREAM] 30 g 1    Sig: APPLY TO AFFECTED AREA(S) THREE TIMES A DAY     Not Delegated - Dermatology:  Corticosteroids Failed - 11/02/2023 12:03 PM      Failed - This refill cannot be delegated      Passed - Valid encounter within last 12 months    Recent Outpatient Visits           3 months ago Pain and swelling of right knee   [place] [facility] Grayce Sessions, NP   4 months ago Primary hypertension   [place] [facility] Grayce Sessions, NP   5 months ago Encounter for immunization   [place] [facility] Grayce Sessions, NP   8 months ago Pain and swelling of right knee   [place] [facility] Grayce Sessions, NP   11 months ago Vaginal irritation   [place] [facility] Grayce Sessions, NP              Refused Prescriptions Disp Refills   traZODone (DESYREL) 50 MG tablet [Pharmacy Med Name: TRAZODONE HCL 50 MG ORAL TABLET] 30 tablet 3    Sig: TAKE 1 TABLET (50 MG TOTAL) BY MOUTH AT BEDTIME.     Psychiatry: Antidepressants - Serotonin Modulator Passed - 11/02/2023 12:03 PM      Passed - Valid encounter within last 6 months    Recent Outpatient Visits           3 months ago Pain and swelling of right knee   [place] [facility] Grayce Sessions, NP   4 months ago Primary hypertension   [place] [facility] Grayce Sessions, NP   5 months ago Encounter for immunization   [place] [facility] Grayce Sessions, NP   8  months ago Pain and swelling of right knee   [place] [facility] Grayce Sessions, NP   11 months ago Vaginal irritation   [place] [facility] Grayce Sessions, NP                 Requested Prescriptions  Pending Prescriptions Disp Refills   triamcinolone cream (KENALOG) 0.1 % [Pharmacy Med Name: TRIAMCINOLONE ACETONIDE 0.1 % EXTERNAL CREAM] 30 g 1    Sig: APPLY TO AFFECTED AREA(S) THREE TIMES A DAY     Not Delegated - Dermatology:  Corticosteroids Failed - 11/02/2023 12:03 PM      Failed - This refill cannot be delegated      Passed - Valid encounter within last 12 months    Recent Outpatient Visits           3 months ago Pain and swelling of right knee   [place] [facility] Grayce Sessions, NP   4 months ago Primary hypertension   [place] [facility] Grayce Sessions, NP   5 months ago Encounter for immunization    [facility] Grayce Sessions, NP   8 months ago Pain and swelling of right knee  [place] [facility] Grayce Sessions, NP   11 months ago Vaginal irritation   [place] [facility] Grayce Sessions, NP              Refused Prescriptions Disp Refills   traZODone (DESYREL) 50 MG tablet [Pharmacy Med Name: TRAZODONE HCL 50 MG ORAL TABLET] 30 tablet 3    Sig: TAKE 1 TABLET (50 MG TOTAL) BY MOUTH AT BEDTIME.     Psychiatry: Antidepressants - Serotonin Modulator Passed - 11/02/2023 12:03 PM      Passed - Valid encounter within last 6 months    Recent Outpatient Visits           3 months ago Pain and swelling of right knee   [place] [facility] Grayce Sessions, NP   4 months ago Primary hypertension   [place] [facility] Grayce Sessions, NP   5 months ago Encounter for immunization   [place] [facility] Grayce Sessions, NP   8 months ago Pain and swelling of right knee   [place] [facility] Grayce Sessions, NP   11 months ago Vaginal irritation   [place] [facility] Grayce Sessions, NP

## 2023-11-02 NOTE — Telephone Encounter (Signed)
 Rx 07/25/23 #30 3RF- too soon Requested Prescriptions  Pending Prescriptions Disp Refills   triamcinolone cream (KENALOG) 0.1 % [Pharmacy Med Name: TRIAMCINOLONE ACETONIDE 0.1 % EXTERNAL CREAM] 30 g 1    Sig: APPLY TO AFFECTED AREA(S) THREE TIMES A DAY     Not Delegated - Dermatology:  Corticosteroids Failed - 11/02/2023 12:02 PM      Failed - This refill cannot be delegated      Passed - Valid encounter within last 12 months    Recent Outpatient Visits           3 months ago Pain and swelling of right knee   [place] [facility] Grayce Sessions, NP   4 months ago Primary hypertension   [place] [facility] Grayce Sessions, NP   5 months ago Encounter for immunization   [place] [facility] Grayce Sessions, NP   8 months ago Pain and swelling of right knee   [place] [facility] Grayce Sessions, NP   11 months ago Vaginal irritation   [place] [facility] Grayce Sessions, NP               traZODone (DESYREL) 50 MG tablet [Pharmacy Med Name: TRAZODONE HCL 50 MG ORAL TABLET] 30 tablet 3    Sig: TAKE 1 TABLET (50 MG TOTAL) BY MOUTH AT BEDTIME.     Psychiatry: Antidepressants - Serotonin Modulator Passed - 11/02/2023 12:02 PM      Passed - Valid encounter within last 6 months    Recent Outpatient Visits           3 months ago Pain and swelling of right knee   [place] [facility] Grayce Sessions, NP   4 months ago Primary hypertension   [place] [facility] Grayce Sessions, NP   5 months ago Encounter for immunization   [place] [facility] Grayce Sessions, NP   8 months ago Pain and swelling of right knee   [place] [facility] Grayce Sessions, NP   11 months ago Vaginal irritation   [place] [facility] Grayce Sessions, NP

## 2023-11-14 ENCOUNTER — Ambulatory Visit
Admission: RE | Admit: 2023-11-14 | Discharge: 2023-11-14 | Disposition: A | Discharge status: Home / Self Care | Payer: Medicaid Other | Source: Ambulatory Visit | Attending: Orthopedic Surgery | Admitting: Orthopedic Surgery

## 2023-11-14 DIAGNOSIS — M79604 Pain in right leg: Secondary | ICD-10-CM

## 2023-12-06 ENCOUNTER — Other Ambulatory Visit (INDEPENDENT_AMBULATORY_CARE_PROVIDER_SITE_OTHER): Payer: Self-pay | Admitting: Primary Care

## 2023-12-06 DIAGNOSIS — M25561 Pain in right knee: Secondary | ICD-10-CM

## 2023-12-06 DIAGNOSIS — R202 Paresthesia of skin: Secondary | ICD-10-CM

## 2023-12-06 DIAGNOSIS — Z760 Encounter for issue of repeat prescription: Secondary | ICD-10-CM

## 2023-12-06 DIAGNOSIS — M25461 Effusion, right knee: Secondary | ICD-10-CM

## 2023-12-08 NOTE — Telephone Encounter (Signed)
 Requested medication (s) are due for refill today: yes  Requested medication (s) are on the active medication list: yes  Last refill:  07/25/23   Future visit scheduled: no  Notes to clinic:  expired 08/24/23   Requested Prescriptions  Pending Prescriptions Disp Refills   gabapentin (NEURONTIN) 300 MG capsule [Pharmacy Med Name: GABAPENTIN 300 MG ORAL CAPSULE] 90 capsule 0    Sig: Take 1 capsule (300 mg total) by mouth 3 (three) times daily.     Neurology: Anticonvulsants - gabapentin Passed - 12/08/2023  9:26 AM      Passed - Cr in normal range and within 360 days    Creatinine, Ser  Date Value Ref Range Status  07/25/2023 0.83 0.57 - 1.00 mg/dL Final         Passed - Completed PHQ-2 or PHQ-9 in the last 360 days      Passed - Valid encounter within last 12 months    Recent Outpatient Visits           4 months ago Pain and swelling of right knee   [place] [facility] Grayce Sessions, NP   6 months ago Primary hypertension   [place] [facility] Grayce Sessions, NP   7 months ago Encounter for immunization   [place] [facility] Grayce Sessions, NP   10 months ago Pain and swelling of right knee   [place] [facility] Grayce Sessions, NP   1 year ago Vaginal irritation   [place] [facility] Grayce Sessions, NP               famotidine (PEPCID) 20 MG tablet [Pharmacy Med Name: FAMOTIDINE 20 MG ORAL TABLET] 180 tablet 0    Sig: Take 1 tablet (20 mg total) by mouth 2 (two) times daily.     Gastroenterology:  H2 Antagonists Passed - 12/08/2023  9:26 AM      Passed - Valid encounter within last 12 months    Recent Outpatient Visits           4 months ago Pain and swelling of right knee   [place] [facility] Grayce Sessions, NP   6 months ago Primary hypertension   [place] [facility] Grayce Sessions, NP    7 months ago Encounter for immunization   [place] [facility] Grayce Sessions, NP   10 months ago Pain and swelling of right knee   [place] [facility] Grayce Sessions, NP   1 year ago Vaginal irritation    [facility] Grayce Sessions, NP

## 2023-12-27 ENCOUNTER — Encounter: Payer: Self-pay | Admitting: Family

## 2023-12-27 ENCOUNTER — Ambulatory Visit: Admitting: Family

## 2023-12-27 DIAGNOSIS — S83241A Other tear of medial meniscus, current injury, right knee, initial encounter: Secondary | ICD-10-CM | POA: Diagnosis not present

## 2024-01-05 ENCOUNTER — Encounter: Payer: Self-pay | Admitting: Family

## 2024-01-05 NOTE — Progress Notes (Signed)
 Office Visit Note   Patient: Robin Fischer           Date of Birth: Nov 22, 1970           MRN: [id number] Visit Date: 12/27/2023              Requested by: Marius Siemens, NP [address] [place],  [place] [address] PCP: Marius Siemens, NP  Chief Complaint  Patient presents with   Right Knee - Follow-up    MRI review       HPI: The patient is a 53 year old woman who presents today to review an MRI scan of her right knee.  She had been nervous that she might be told she needs knee replacement and has been hesitant to follow-up  Continues to have significant pain swelling and difficulty with full extension  She has tried Depo-Medrol  injections as well as physical therapy without any improvement  Assessment & Plan: Visit Diagnoses: No diagnosis found.  Plan: MRI revealing for multiple complex tears of the anterior posterior horn as well as partial thickness cartilage loss.  MRI reviewed by Dr. Julio Ohm who recommends arthroscopic intervention.  Patient is in agreement with the plan.  Follow-Up Instructions: No follow-ups on file.   Ortho Exam  Patient is alert, oriented, no adenopathy, well-dressed, normal affect, normal respiratory effort. On examination right knee there is crepitation with range of motion.  She lacks 10 degrees of full extension.  Her collaterals and cruciates are stable.  There is no effusion.  Imaging: No results found. No images are attached to the encounter.  Labs: Lab Results  Component Value Date   HGBA1C 4.9 09/28/2021   HGBA1C 5.0 12/27/2019   LABURIC 5.7 07/25/2023   LABURIC 7.6 (H) 02/06/2023   REPTSTATUS 08/18/2021 FINAL 08/16/2021   CULT >=100,000 COLONIES/mL ESCHERICHIA COLI (A) 08/16/2021   LABORGA ESCHERICHIA COLI (A) 08/16/2021     Lab Results  Component Value Date   ALBUMIN 4.7 07/25/2023   ALBUMIN 3.2 (L) 09/29/2022   ALBUMIN 5.2 (H) 09/27/2022    Lab Results  Component Value Date   MG 2.1 09/27/2022   MG 2.3  04/29/2022   MG 2.5 (H) 01/18/2022   Lab Results  Component Value Date   VD25OH 38.1 07/25/2023    No results found for: "PREALBUMIN"    Latest Ref Rng & Units 09/29/2022    5:13 AM 09/28/2022    5:10 AM 09/27/2022    9:16 AM  CBC EXTENDED  WBC 4.0 - 10.5 K/uL 7.7  9.1  13.8   RBC 3.87 - 5.11 MIL/uL 4.66  4.56  5.90   Hemoglobin 12.0 - 15.0 g/dL 19.1  47.8  29.5   HCT 36.0 - 46.0 % 37.7  36.6  46.0   Platelets 150 - 400 K/uL 227  253  395   NEUT# 1.7 - 7.7 K/uL  5.6    Lymph# 0.7 - 4.0 K/uL  2.9       There is no height or weight on file to calculate BMI.  Orders:  No orders of the defined types were placed in this encounter.  No orders of the defined types were placed in this encounter.    Procedures: No procedures performed  Clinical Data: No additional findings.  ROS:  All other systems negative, except as noted in the HPI. Review of Systems  Objective: Vital Signs: There were no vitals taken for this visit.  Specialty Comments:  No specialty comments available.  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   Excessive daytime sleepiness 04/19/2022   Pulmonary emphysema (HCC) 04/19/2022   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HCC) 04/19/2022   Migraine without aura and without status migrainosus, not intractable 02/22/2022   Gross hematuria 09/24/2021   Tonsillopharyngitis 07/28/2020   Acute lower UTI 07/28/2020   Dehydration 05/04/2019   Pancreatitis 05/04/2019   Hypokalemia 08/20/2018   Tobacco abuse 08/20/2018   Leukocytosis 08/20/2018   Alcohol abuse 08/20/2018   Alcoholic pancreatitis 08/20/2018   Abnormal LFTs 08/20/2018   Acute pancreatitis  08/20/2018   Asthma    Hypertension    Past Medical History:  Diagnosis Date   Asthma    Bronchitis    GERD (gastroesophageal reflux disease)    High cholesterol    Hypertension    Pancreatitis    Tobacco abuse     Family History  Problem Relation Age of Onset   Hypertension Mother    Hyperlipidemia Mother    Hypertension Father    Hyperlipidemia Father    Migraines Neg Hx    Headache Neg Hx     Past Surgical History:  Procedure Laterality Date   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

## 2024-01-26 ENCOUNTER — Other Ambulatory Visit (INDEPENDENT_AMBULATORY_CARE_PROVIDER_SITE_OTHER): Payer: Self-pay | Admitting: Primary Care

## 2024-01-30 NOTE — Telephone Encounter (Signed)
 Requested medications are due for refill today.  unsure  Requested medications are on the active medications list.  yes  Last refill. 11/05/2023 30g 1 rf  Future visit scheduled.   no  Notes to clinic.  Refill not delegated.    Requested Prescriptions  Pending Prescriptions Disp Refills   triamcinolone  cream (KENALOG) 0.1 % [Pharmacy Med Name: TRIAMCINOLONE  ACETONIDE 0.1 % EXTERNAL CREAM] 30 g 1    Sig: APPLY TO AFFECTED AREA(S) THREE TIMES A DAY     Not Delegated - Dermatology:  Corticosteroids Failed - 01/30/2024 12:35 PM      Failed - This refill cannot be delegated      Passed - Valid encounter within last 12 months    Recent Outpatient Visits           6 months ago Pain and swelling of right knee   [place] [facility] Marius Siemens, NP   7 months ago Primary hypertension   [place] [facility] Marius Siemens, NP   8 months ago Encounter for immunization   [place] [facility] Marius Siemens, NP   11 months ago Pain and swelling of right knee   [place] [facility] Marius Siemens, NP   1 year ago Vaginal irritation   [place] [facility] Marius Siemens, NP              Signed Prescriptions Disp Refills   cetirizine  (ZYRTEC ) 10 MG tablet 90 tablet 1    Sig: TAKE 1 TABLET (10 MG TOTAL) BY MOUTH DAILY AS NEEDED FOR ALLERGIES OR RHINITIS.     Ear, Nose, and Throat:  Antihistamines 2 Passed - 01/30/2024 12:35 PM      Passed - Cr in normal range and within 360 days    Creatinine, Ser  Date Value Ref Range Status  07/25/2023 0.83 0.57 - 1.00 mg/dL Final         Passed - Valid encounter within last 12 months    Recent Outpatient Visits           6 months ago Pain and swelling of right knee   [place] [facility] Marius Siemens, NP   7 months ago Primary hypertension   [place] [facility] Marius Siemens, NP   8 months ago Encounter for immunization   [place] [facility] Marius Siemens, NP   11 months ago Pain and swelling of right knee   [place] [facility] Marius Siemens, NP   1 year ago Vaginal irritation   [place] [facility] Marius Siemens, NP               famotidine  (PEPCID ) 20 MG tablet 180 tablet 1    Sig: TAKE 1 TABLET (20 MG TOTAL) BY MOUTH 2 (TWO) TIMES DAILY.     Gastroenterology:  H2 Antagonists Passed - 01/30/2024 12:35 PM      Passed - Valid encounter within last 12 months    Recent Outpatient Visits           6 months ago Pain and swelling of right knee   [place] [facility] Marius Siemens, NP   7 months ago Primary hypertension   [place] [facility] Marius Siemens, NP   8 months ago Encounter for immunization   [facility] Marius Siem[facility], NP  11 months ago Pain and swelling of right knee   [place] [facility] Marius Siemens, NP   1 year ago Vaginal irritation   [place] [facility] Marius Siemens, NP

## 2024-01-30 NOTE — Telephone Encounter (Signed)
 Requested Prescriptions  Pending Prescriptions Disp Refills   triamcinolone  cream (KENALOG) 0.1 % [Pharmacy Med Name: TRIAMCINOLONE  ACETONIDE 0.1 % EXTERNAL CREAM] 30 g 1    Sig: APPLY TO AFFECTED AREA(S) THREE TIMES A DAY     Not Delegated - Dermatology:  Corticosteroids Failed - 01/30/2024 12:35 PM      Failed - This refill cannot be delegated      Passed - Valid encounter within last 12 months    Recent Outpatient Visits           6 months ago Pain and swelling of right knee   [place] [facility] Marius Siemens, NP   7 months ago Primary hypertension   [place] [facility] Marius Siemens, NP   8 months ago Encounter for immunization   [place] [facility] Marius Siemens, NP   11 months ago Pain and swelling of right knee   [place] [facility] Marius Siemens, NP   1 year ago Vaginal irritation   [place] [facility] Marius Siemens, NP               cetirizine  (ZYRTEC ) 10 MG tablet [Pharmacy Med Name: CETIRIZINE  HCL 10 MG ORAL TABLET] 90 tablet 1    Sig: TAKE 1 TABLET (10 MG TOTAL) BY MOUTH DAILY AS NEEDED FOR ALLERGIES OR RHINITIS.     Ear, Nose, and Throat:  Antihistamines 2 Passed - 01/30/2024 12:35 PM      Passed - Cr in normal range and within 360 days    Creatinine, Ser  Date Value Ref Range Status  07/25/2023 0.83 0.57 - 1.00 mg/dL Final         Passed - Valid encounter within last 12 months    Recent Outpatient Visits           6 months ago Pain and swelling of right knee   [place] [facility] Marius Siemens, NP   7 months ago Primary hypertension   [place] [facility] Marius Siemens, NP   8 months ago Encounter for immunization   [place] [facility] Marius Siemens, NP   11 months ago Pain and swelling of right knee   [place] [facility]  Marius Siemens, NP   1 year ago Vaginal irritation   [place] [facility] Marius Siemens, NP               famotidine  (PEPCID ) 20 MG tablet [Pharmacy Med Name: FAMOTIDINE  20 MG ORAL TABLET] 180 tablet 1    Sig: TAKE 1 TABLET (20 MG TOTAL) BY MOUTH 2 (TWO) TIMES DAILY.     Gastroenterology:  H2 Antagonists Passed - 01/30/2024 12:35 PM      Passed - Valid encounter within last 12 months    Recent Outpatient Visits           6 months ago Pain and swelling of right knee   [place] [facility] Marius Siemens, NP   7 months ago Primary hypertension   [place] [facility] Marius Siemens, NP   8 months ago Encounter for immunization   [place] [facility] Marius Siemens, NP   11 months ago Pain and swelling of right knee   [place] [facility] Marius Siemens, NP   1 year ago Vaginal irritation    [facility] M[facility] Siem[facility], NP

## 2024-02-27 ENCOUNTER — Other Ambulatory Visit (INDEPENDENT_AMBULATORY_CARE_PROVIDER_SITE_OTHER): Payer: Self-pay | Admitting: Primary Care

## 2024-02-27 DIAGNOSIS — Z760 Encounter for issue of repeat prescription: Secondary | ICD-10-CM

## 2024-02-27 NOTE — Telephone Encounter (Signed)
 Will forward to provider

## 2024-03-14 ENCOUNTER — Ambulatory Visit: Payer: Self-pay

## 2024-03-14 NOTE — Telephone Encounter (Signed)
 FYI Only or Action Required?: Action required by provider: request for appointment.  Patient was last seen in primary care on 07/25/2023 by [name], NP.  Called Nurse Triage reporting Urinary symptoms.  Symptoms began a week ago.  Interventions attempted: Nothing.  Symptoms are: gradually worsening. Urinary burning, foul odor to urine since last week. Will only see PCP. Warm transfer to [place] in the practice for appointment, no availability.  Triage Disposition: See Physician Within 24 Hours  Patient/caregiver understands and will follow disposition?: Yes     Copied from CRM [id number]. Topic: Clinical - Red Word Triage >> Mar 14, 2024 10:58 AM Ivette P wrote: [facility] that prompted transfer to Nurse Triage: Uti symptoms   Odor - Burn urnation Reason for Disposition  Bad or foul-smelling urine  Answer Assessment - Initial Assessment Questions 1. SYMPTOM: What's the main symptom you're concerned about? (e.g., frequency, incontinence)     Burning, odor to urine 2. ONSET: When did the    start?     Last week 3. PAIN: Is there any pain? If Yes, ask: How bad is it? (Scale: 1-10; mild, moderate, severe)     mild 4. CAUSE: What do you think is causing the symptoms?     UTI 5. OTHER SYMPTOMS: Do you have any other symptoms? (e.g., blood in urine, fever, flank pain, pain with urination)     NO 6. PREGNANCY: Is there any chance you are pregnant? When was your last menstrual period?     NO  Protocols used: Urinary Symptoms-A-AH

## 2024-03-21 ENCOUNTER — Ambulatory Visit (INDEPENDENT_AMBULATORY_CARE_PROVIDER_SITE_OTHER): Admitting: Primary Care

## 2024-03-25 ENCOUNTER — Encounter (HOSPITAL_COMMUNITY): Payer: Self-pay | Admitting: Orthopedic Surgery

## 2024-03-25 ENCOUNTER — Other Ambulatory Visit: Payer: Self-pay

## 2024-03-25 NOTE — H&P (Signed)
 Robin Fischer is an 53 y.o. female.   Chief Complaint: right knee pain HPI:  The patient is a 53 year old woman who presents today to review an MRI scan of her right knee.  She had been nervous that she might be told she needs knee replacement and has been hesitant to follow-up   Continues to have significant pain swelling and difficulty with full extension   She has tried Depo-Medrol  injections as well as physical therapy without any improvement Past Medical History:  Diagnosis Date   Arthritis    Asthma    Bronchitis    Depression    Dyspnea    occasional with exertion   Emphysema of lung (HCC)    GERD (gastroesophageal reflux disease)    High cholesterol    Hypertension    Pancreatitis    Pneumonia    x 1   Tobacco abuse     Past Surgical History:  Procedure Laterality Date   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 She has never used smokeless tobacco. She reports current alcohol use of about 14.0 standard drinks of alcohol per week. She reports current drug use. Drug: Marijuana.  Allergies: No Known Allergies  No medications prior to admission.    No results found for this or any previous visit (from the past 48 hours). No results found.  Review of Systems  All other systems reviewed and are negative.   Height 5' 7 (1.702 m), weight 98 kg. Physical Exam  Patient is alert, oriented, no adenopathy, well-dressed, normal affect, normal respiratory effort. On examination right knee there is crepitation with range of motion.  She  lacks 10 degrees of full extension.  Her collaterals and cruciates are stable.  There is no effusion.   Assessment/Plan MRI revealing for multiple complex tears of the anterior posterior horn as well as partial thickness cartilage loss. MRI reviewed by Dr. Harden who recommends arthroscopic intervention. Patient is in agreement with the plan.   Maurilio Deland Collet, PA-C 03/25/2024, 7:33 PM

## 2024-03-25 NOTE — Progress Notes (Signed)
 PCP - Rosaline Bohr, NP Cardiologist - none  Chest x-ray - n/a EKG - 09/28/22 Stress Test - n/a ECHO - n/a Cardiac Cath - n/a  ICD Pacemaker/Loop - n/a  Sleep Study -  n/a  Diabetes - n/a  Aspirin  & Blood Thinner Instructions:  n/a  ERAS - clear liquids til 6:25 AM DOS.  Anesthesia review: Yes  STOP now taking any Aspirin  (unless otherwise instructed by your surgeon), Aleve , Naproxen , Ibuprofen , Motrin , Advil , Goody's, BC's, all herbal medications, fish oil, and all vitamins.   Coronavirus Screening Do you have any of the following symptoms:  Cough yes/no: No Fever (>100.51F)  yes/no: No Runny nose yes/no: No Sore throat yes/no: No Difficulty breathing/shortness of breath  yes/no: No  Have you traveled in the last 14 days and where? yes/no: No  Patient verbalized understanding of instructions that were given via phone.

## 2024-03-27 ENCOUNTER — Ambulatory Visit (HOSPITAL_COMMUNITY)
Admission: RE | Admit: 2024-03-27 | Discharge: 2024-03-27 | Disposition: A | Discharge status: Home / Self Care | Attending: Orthopedic Surgery | Admitting: Orthopedic Surgery

## 2024-03-27 ENCOUNTER — Ambulatory Visit (HOSPITAL_COMMUNITY): Payer: Self-pay | Admitting: Physician Assistant

## 2024-03-27 ENCOUNTER — Ambulatory Visit (HOSPITAL_BASED_OUTPATIENT_CLINIC_OR_DEPARTMENT_OTHER): Payer: Self-pay | Admitting: Physician Assistant

## 2024-03-27 ENCOUNTER — Other Ambulatory Visit (HOSPITAL_COMMUNITY): Payer: Self-pay

## 2024-03-27 ENCOUNTER — Other Ambulatory Visit: Payer: Self-pay

## 2024-03-27 ENCOUNTER — Encounter (HOSPITAL_COMMUNITY)
Admission: RE | Disposition: A | Discharge status: Home / Self Care | Payer: Self-pay | Source: Home / Self Care | Attending: Orthopedic Surgery

## 2024-03-27 ENCOUNTER — Encounter (HOSPITAL_COMMUNITY): Payer: Self-pay | Admitting: Orthopedic Surgery

## 2024-03-27 DIAGNOSIS — I10 Essential (primary) hypertension: Secondary | ICD-10-CM | POA: Diagnosis not present

## 2024-03-27 DIAGNOSIS — X58XXXA Exposure to other specified factors, initial encounter: Secondary | ICD-10-CM | POA: Diagnosis not present

## 2024-03-27 DIAGNOSIS — K219 Gastro-esophageal reflux disease without esophagitis: Secondary | ICD-10-CM | POA: Diagnosis not present

## 2024-03-27 DIAGNOSIS — S83241D Other tear of medial meniscus, current injury, right knee, subsequent encounter: Secondary | ICD-10-CM | POA: Diagnosis not present

## 2024-03-27 DIAGNOSIS — S83271A Complex tear of lateral meniscus, current injury, right knee, initial encounter: Secondary | ICD-10-CM | POA: Diagnosis not present

## 2024-03-27 DIAGNOSIS — S83241A Other tear of medial meniscus, current injury, right knee, initial encounter: Secondary | ICD-10-CM | POA: Insufficient documentation

## 2024-03-27 DIAGNOSIS — Z79899 Other long term (current) drug therapy: Secondary | ICD-10-CM | POA: Diagnosis not present

## 2024-03-27 DIAGNOSIS — S83221A Peripheral tear of medial meniscus, current injury, right knee, initial encounter: Secondary | ICD-10-CM | POA: Diagnosis not present

## 2024-03-27 DIAGNOSIS — F32A Depression, unspecified: Secondary | ICD-10-CM | POA: Diagnosis not present

## 2024-03-27 DIAGNOSIS — M23206 Derangement of unspecified meniscus due to old tear or injury, right knee: Secondary | ICD-10-CM

## 2024-03-27 DIAGNOSIS — M21961 Unspecified acquired deformity of right lower leg: Secondary | ICD-10-CM | POA: Diagnosis not present

## 2024-03-27 DIAGNOSIS — M25569 Pain in unspecified knee: Secondary | ICD-10-CM

## 2024-03-27 HISTORY — DX: Pneumonia, unspecified organism: J18.9

## 2024-03-27 HISTORY — PX: KNEE ARTHROSCOPY W/ DEBRIDEMENT: SHX1867

## 2024-03-27 HISTORY — DX: Dyspnea, unspecified: R06.00

## 2024-03-27 HISTORY — DX: Unspecified osteoarthritis, unspecified site: M19.90

## 2024-03-27 HISTORY — DX: Depression, unspecified: F32.A

## 2024-03-27 HISTORY — DX: Emphysema, unspecified: J43.9

## 2024-03-27 LAB — CBC WITH DIFFERENTIAL/PLATELET
Abs Immature Granulocytes: 0.01 K/uL (ref 0.00–0.07)
Basophils Absolute: 0.1 K/uL (ref 0.0–0.1)
Basophils Relative: 1 %
Eosinophils Absolute: 0.1 K/uL (ref 0.0–0.5)
Eosinophils Relative: 1 %
HCT: 45 % (ref 36.0–46.0)
Hemoglobin: 15.9 g/dL — ABNORMAL HIGH (ref 12.0–15.0)
Immature Granulocytes: 0 %
Lymphocytes Relative: 50 %
Lymphs Abs: 4.7 K/uL — ABNORMAL HIGH (ref 0.7–4.0)
MCH: 27.6 pg (ref 26.0–34.0)
MCHC: 35.3 g/dL (ref 30.0–36.0)
MCV: 78 fL — ABNORMAL LOW (ref 80.0–100.0)
Monocytes Absolute: 0.5 K/uL (ref 0.1–1.0)
Monocytes Relative: 5 %
Neutro Abs: 4.1 K/uL (ref 1.7–7.7)
Neutrophils Relative %: 43 %
Platelets: 242 K/uL (ref 150–400)
RBC: 5.77 MIL/uL — ABNORMAL HIGH (ref 3.87–5.11)
RDW: 14 % (ref 11.5–15.5)
WBC: 9.5 K/uL (ref 4.0–10.5)
nRBC: 0 % (ref 0.0–0.2)

## 2024-03-27 LAB — COMPREHENSIVE METABOLIC PANEL WITH GFR
ALT: 20 U/L (ref 0–44)
AST: 32 U/L (ref 15–41)
Albumin: 3.7 g/dL (ref 3.5–5.0)
Alkaline Phosphatase: 95 U/L (ref 38–126)
Anion gap: 16 — ABNORMAL HIGH (ref 5–15)
BUN: 12 mg/dL (ref 6–20)
CO2: 23 mmol/L (ref 22–32)
Calcium: 9.1 mg/dL (ref 8.9–10.3)
Chloride: 97 mmol/L — ABNORMAL LOW (ref 98–111)
Creatinine, Ser: 0.82 mg/dL (ref 0.44–1.00)
GFR, Estimated: >60 mL/min (ref >60)
Glucose, Bld: 98 mg/dL (ref 70–99)
Potassium: 3.9 mmol/L (ref 3.5–5.1)
Sodium: 136 mmol/L (ref 135–145)
Total Bilirubin: 1 mg/dL (ref 0.0–1.2)
Total Protein: 6.8 g/dL (ref 6.5–8.1)

## 2024-03-27 SURGERY — ARTHROSCOPY, KNEE WITH DEBRIDEMENT
Anesthesia: General | Site: Knee | Laterality: Right

## 2024-03-27 MED ORDER — FENTANYL CITRATE (PF) 100 MCG/2ML IJ SOLN
25.0000 ug | INTRAMUSCULAR | Status: DC | PRN
  Administered 2024-03-27 (×3): 50 ug via INTRAVENOUS

## 2024-03-27 MED ORDER — ACETAMINOPHEN 10 MG/ML IV SOLN: 1000.0000 mg | Freq: Once | INTRAVENOUS | Status: DC | PRN

## 2024-03-27 MED ORDER — CEFAZOLIN SODIUM-DEXTROSE 2-4 GM/100ML-% IV SOLN
2.0000 g | INTRAVENOUS | Status: AC
  Administered 2024-03-27: 2 g via INTRAVENOUS

## 2024-03-27 MED ORDER — PROPOFOL 10 MG/ML IV BOLUS
INTRAVENOUS | Status: AC
  Filled 2024-03-27: qty 20

## 2024-03-27 MED ORDER — ONDANSETRON HCL 4 MG/2ML IJ SOLN
INTRAMUSCULAR | Status: DC | PRN
  Administered 2024-03-27: 4 mg via INTRAVENOUS

## 2024-03-27 MED ORDER — FENTANYL CITRATE (PF) 250 MCG/5ML IJ SOLN
INTRAMUSCULAR | Status: AC
  Filled 2024-03-27: qty 5

## 2024-03-27 MED ORDER — OXYCODONE HCL 5 MG PO TABS
5.0000 mg | ORAL_TABLET | Freq: Once | ORAL | Status: AC | PRN
  Administered 2024-03-27: 5 mg via ORAL

## 2024-03-27 MED ORDER — LIDOCAINE 2% (20 MG/ML) 5 ML SYRINGE
INTRAMUSCULAR | Status: DC | PRN
  Administered 2024-03-27: 80 mg via INTRAVENOUS

## 2024-03-27 MED ORDER — OXYCODONE HCL 5 MG PO TABS
ORAL_TABLET | ORAL | Status: AC
  Filled 2024-03-27: qty 1

## 2024-03-27 MED ORDER — MIDAZOLAM HCL 2 MG/2ML IJ SOLN
INTRAMUSCULAR | Status: DC | PRN
  Administered 2024-03-27: 2 mg via INTRAVENOUS

## 2024-03-27 MED ORDER — DIPHENHYDRAMINE HCL 50 MG/ML IJ SOLN
12.5000 mg | Freq: Once | INTRAMUSCULAR | Status: AC
  Administered 2024-03-27: 12.5 mg via INTRAVENOUS

## 2024-03-27 MED ORDER — FENTANYL CITRATE (PF) 250 MCG/5ML IJ SOLN
INTRAMUSCULAR | Status: DC | PRN
  Administered 2024-03-27 (×2): 50 ug via INTRAVENOUS
  Administered 2024-03-27 (×2): 25 ug via INTRAVENOUS

## 2024-03-27 MED ORDER — MIDAZOLAM HCL 2 MG/2ML IJ SOLN
INTRAMUSCULAR | Status: AC
  Filled 2024-03-27: qty 2

## 2024-03-27 MED ORDER — ONDANSETRON HCL 4 MG/2ML IJ SOLN
INTRAMUSCULAR | Status: AC
  Filled 2024-03-27: qty 2

## 2024-03-27 MED ORDER — CEFAZOLIN SODIUM-DEXTROSE 2-4 GM/100ML-% IV SOLN
INTRAVENOUS | Status: AC
  Filled 2024-03-27: qty 100

## 2024-03-27 MED ORDER — LIDOCAINE 2% (20 MG/ML) 5 ML SYRINGE
INTRAMUSCULAR | Status: AC
  Filled 2024-03-27: qty 5

## 2024-03-27 MED ORDER — CHLORHEXIDINE GLUCONATE 0.12 % MT SOLN
OROMUCOSAL | Status: AC
  Administered 2024-03-27: 15 mL via OROMUCOSAL
  Filled 2024-03-27: qty 15

## 2024-03-27 MED ORDER — FENTANYL CITRATE (PF) 100 MCG/2ML IJ SOLN
INTRAMUSCULAR | Status: AC
  Filled 2024-03-27: qty 2

## 2024-03-27 MED ORDER — OXYCODONE-ACETAMINOPHEN 5-325 MG PO TABS
1.0000 | ORAL_TABLET | Freq: Four times a day (QID) | ORAL | 0 refills | Status: AC | PRN
  Filled 2024-03-27: qty 20, 5d supply, fill #0

## 2024-03-27 MED ORDER — LACTATED RINGERS IV SOLN: INTRAVENOUS | Status: DC

## 2024-03-27 MED ORDER — DEXAMETHASONE SODIUM PHOSPHATE 10 MG/ML IJ SOLN
INTRAMUSCULAR | Status: DC | PRN
  Administered 2024-03-27: 5 mg via INTRAVENOUS

## 2024-03-27 MED ORDER — CHLORHEXIDINE GLUCONATE 0.12 % MT SOLN: 15.0000 mL | Freq: Once | OROMUCOSAL | Status: AC

## 2024-03-27 MED ORDER — PROPOFOL 10 MG/ML IV BOLUS
INTRAVENOUS | Status: DC | PRN
  Administered 2024-03-27: 200 mg via INTRAVENOUS

## 2024-03-27 MED ORDER — DEXAMETHASONE SODIUM PHOSPHATE 10 MG/ML IJ SOLN
INTRAMUSCULAR | Status: AC
  Filled 2024-03-27: qty 1

## 2024-03-27 MED ORDER — DIPHENHYDRAMINE HCL 50 MG/ML IJ SOLN
INTRAMUSCULAR | Status: AC
  Filled 2024-03-27: qty 1

## 2024-03-27 MED ORDER — FENTANYL CITRATE (PF) 100 MCG/2ML IJ SOLN
INTRAMUSCULAR | Status: AC
Start: 2024-03-27 — End: 2024-03-27
  Filled 2024-03-27: qty 2

## 2024-03-27 MED ORDER — ORAL CARE MOUTH RINSE: 15.0000 mL | Freq: Once | OROMUCOSAL | Status: AC

## 2024-03-27 MED ORDER — SODIUM CHLORIDE 0.9 % IR SOLN
Status: DC | PRN
  Administered 2024-03-27: 5000 mL

## 2024-03-27 MED ORDER — OXYCODONE HCL 5 MG/5ML PO SOLN: 5.0000 mg | Freq: Once | ORAL | Status: AC | PRN

## 2024-03-27 SURGICAL SUPPLY — 26 items
BLADE EXCALIBUR 4.0X13 (MISCELLANEOUS) IMPLANT
BNDG COHESIVE 6X5 TAN ST LF (GAUZE/BANDAGES/DRESSINGS) ×1 IMPLANT
BNDG GAUZE DERMACEA FLUFF 4 (GAUZE/BANDAGES/DRESSINGS) ×1 IMPLANT
COVER SURGICAL LIGHT HANDLE (MISCELLANEOUS) ×2 IMPLANT
CUFF TOURN SGL QUICK 42 (TOURNIQUET CUFF) IMPLANT
CUFF TRNQT CYL 34X4.125X (TOURNIQUET CUFF) IMPLANT
DRAPE ARTHROSCOPY W/POUCH 114 (DRAPES) ×1 IMPLANT
DRAPE U-SHAPE 47X51 STRL (DRAPES) ×1 IMPLANT
DRSG EMULSION OIL 3X3 NADH (GAUZE/BANDAGES/DRESSINGS) ×1 IMPLANT
DURAPREP 26ML APPLICATOR (WOUND CARE) ×1 IMPLANT
GAUZE SPONGE 4X4 12PLY STRL (GAUZE/BANDAGES/DRESSINGS) ×1 IMPLANT
GLOVE BIOGEL PI IND STRL 9 (GLOVE) ×1 IMPLANT
GLOVE SURG ORTHO 9.0 STRL STRW (GLOVE) ×1 IMPLANT
GOWN STRL REUS W/ TWL XL LVL3 (GOWN DISPOSABLE) ×3 IMPLANT
KIT BASIN OR (CUSTOM PROCEDURE TRAY) ×1 IMPLANT
KIT TURNOVER KIT B (KITS) ×1 IMPLANT
MANIFOLD NEPTUNE II (INSTRUMENTS) ×1 IMPLANT
NDL 18GX1X1/2 (RX/OR ONLY) (NEEDLE) ×1 IMPLANT
NEEDLE 18GX1X1/2 (RX/OR ONLY) (NEEDLE) ×1 IMPLANT
PACK ARTHROSCOPY DSU (CUSTOM PROCEDURE TRAY) ×1 IMPLANT
PAD ARMBOARD POSITIONER FOAM (MISCELLANEOUS) ×2 IMPLANT
PORT APPOLLO RF 90DEGREE MULTI (SURGICAL WAND) IMPLANT
SUT ETHILON 4 0 PS 2 18 (SUTURE) ×1 IMPLANT
TOWEL GREEN STERILE FF (TOWEL DISPOSABLE) ×2 IMPLANT
TUBE CONNECTING 12X1/4 (SUCTIONS) ×1 IMPLANT
TUBING ARTHROSCOPY IRRIG 16FT (MISCELLANEOUS) ×1 IMPLANT

## 2024-03-27 NOTE — Discharge Instructions (Addendum)
 Weight bearing on the surgical limb as tolerates.  Ambulate with assistance crutches or walker.  Ice and elevation above the heart multiple times a day.  You may remove the dressing in 24 hours and shower.  Place band aides over sutures.

## 2024-03-27 NOTE — Progress Notes (Signed)
 Wasted 50mcg Fentanyl  in stericycle with Waddell Oddi RN  Marty Needles, RN

## 2024-03-27 NOTE — Transfer of Care (Signed)
 Immediate Anesthesia Transfer of Care Note  Patient: Robin Fischer  Procedure(s) Performed: ARTHROSCOPY, KNEE WITH DEBRIDEMENT (Right: Knee)  Patient Location: PACU  Anesthesia Type:General  Level of Consciousness: awake, alert , patient cooperative, and responds to stimulation  Airway & Oxygen Therapy: Patient Spontanous Breathing and Patient connected to face mask oxygen  Post-op Assessment: Report given to RN and Post -op Vital signs reviewed and stable  Post vital signs: Reviewed and stable  Last Vitals:  Vitals Value Taken Time  BP 143/77 03/27/24 09:41  Temp    Pulse 69 03/27/24 09:45  Resp 16 03/27/24 09:45  SpO2 95 % 03/27/24 09:45  Vitals shown include unfiled device data.  Last Pain:  Vitals:   03/27/24 0711  PainSc: 0-No pain         Complications: No notable events documented.

## 2024-03-27 NOTE — Progress Notes (Signed)
 Orthopedic Tech Progress Note Patient Details:  Robin Fischer Jul 05, 1971 [id number]  Ortho Devices Type of Ortho Device: Crutches Ortho Device/Splint Location: legs Ortho Device/Splint Interventions: Ordered, Application, Adjustment  Adjusted crutches and instructed patient on proper crutch use, patient ambulated well. Post Interventions Patient Tolerated: Well, Ambulated well Instructions Provided: Poper ambulation with device, Adjustment of device  Camellia Bo 03/27/2024, 10:39 AM

## 2024-03-27 NOTE — Op Note (Signed)
 03/27/2024  9:46 AM  PATIENT:  Robin Fischer    PRE-OPERATIVE DIAGNOSIS:  RIGHT KNEE MENISCAL TEAR  POST-OPERATIVE DIAGNOSIS: Complex tear lateral meniscus right knee. Peripheral tear medial meniscus right knee. Osteochondral defect of the medial femoral condyle and lateral femoral condyle right knee.  PROCEDURE: Right knee arthroscopy with partial excision of the medial and lateral meniscal tears. Abrasion chondroplasty of the medial femoral condyle and lateral femoral condyle right knee.  SURGEON:  Jerona LULLA Sage, MD  PHYSICIAN ASSISTANT:None ANESTHESIA:   General  PREOPERATIVE INDICATIONS:  Robin Fischer is a  53 y.o. female with a diagnosis of RIGHT KNEE MENISCAL TEAR who failed conservative measures and elected for surgical management.    The risks benefits and alternatives were discussed with the patient preoperatively including but not limited to the risks of infection, bleeding, nerve injury, cardiopulmonary complications, the need for revision surgery, among others, and the patient was willing to proceed.  OPERATIVE IMPLANTS:   * No implants in log *  @ENCIMAGES @  OPERATIVE FINDINGS: Patient had full-thickness osteochondral defects of both the medial femoral condyle and lateral femoral condyle.  There was a large complex tear of the lateral meniscus and small peripheral tear of the medial meniscus.  OPERATIVE PROCEDURE: Patient was brought to the operating room and underwent a general anesthetic.  After adequate levels anesthesia were obtained patient's right lower extremity was prepped using DuraPrep draped into a sterile field a timeout was called.  The scope was inserted through the anterior lateral portal and anterior medial working portal was established.  Visualization showed significant synovitis.  This was resected electrocardio was used hemostasis.  With valgus stress patient did have peripheral tearing of the medial meniscus this was debrided with a shaver.  There  was a large osteochondral defect of the medial femoral condyle and this is debrided back to bleeding viable subchondral bone.  The probe was used and the remaining medial meniscus was stable.  Examination of the notch showed an intact ACL.  Examination the lateral joint line in the figure-of-four position showed a large complex tear of the lateral meniscus.  This was debrided with the shaver.  There was also a large osteochondral defect of the lateral femoral condyle that was also further debrided.  Examination with the knee in straight showed synovitis as well as a plica this was debrided electrocardio was used for hemostasis.  The medial and lateral gutters were checked there were no loose bodies.  A survey of all compartments again showed there to be no loose bodies.  The instruments were removed the portals closed using 2-0 nylon.   DISCHARGE PLANNING:  Antibiotic duration: Preoperative antibiotics  Weightbearing: Weightbearing as tolerated  Pain medication: Prescription for Vicodin  Dressing care/ Wound VAC: Dry dressing removed in 3 days  Ambulatory devices: Crutches  Discharge to: Home.  Follow-up: In the office 1 week post operative.

## 2024-03-27 NOTE — Anesthesia Preprocedure Evaluation (Signed)
 Anesthesia Evaluation  Patient identified by MRN, date of birth, ID band Patient awake    Reviewed: Allergy & Precautions, NPO status , Patient's Chart, lab work & pertinent test results  History of Anesthesia Complications Negative for: history of anesthetic complications  Airway Mallampati: III  TM Distance: >3 FB Neck ROM: Full    Dental  (+) Teeth Intact, Dental Advisory Given   Pulmonary shortness of breath, asthma , COPD, neg recent URI, Current SmokerPatient did not abstain from smoking.   breath sounds clear to auscultation       Cardiovascular hypertension, Pt. on medications (-) angina (-) Past MI and (-) CHF  Rhythm:Regular     Neuro/Psych  Headaches PSYCHIATRIC DISORDERS  Depression       GI/Hepatic ,GERD  Medicated and Controlled,,  Endo/Other  negative endocrine ROS    Renal/GU negative Renal ROSLab Results      Component                Value               Date                      NA                       143                 07/25/2023                K                        4.1                 07/25/2023                CO2                      28                  07/25/2023                GLUCOSE                  96                  07/25/2023                BUN                      12                  07/25/2023                CREATININE               0.83                07/25/2023                CALCIUM                   10.1                07/25/2023                EGFR  84                  07/25/2023                GFRNONAA                 >60                 09/29/2022                Musculoskeletal  (+) Arthritis ,    Abdominal   Peds  Hematology Lab Results      Component                Value               Date                      WBC                      9.5                 03/27/2024                HGB                      15.9 (H)            03/27/2024                 HCT                      45.0                03/27/2024                MCV                      78.0 (L)            03/27/2024                PLT                      242                 03/27/2024              Anesthesia Other Findings   Reproductive/Obstetrics                              Anesthesia Physical Anesthesia Plan  ASA: 2  Anesthesia Plan: General   Post-op Pain Management: Ofirmev  IV (intra-op)* and Toradol  IV (intra-op)*   Induction: Intravenous  PONV Risk Score and Plan: 3 and Ondansetron , Dexamethasone  and Midazolam   Airway Management Planned: LMA  Additional Equipment: None  Intra-op Plan:   Post-operative Plan: Extubation in OR  Informed Consent: I have reviewed the patients History and Physical, chart, labs and discussed the procedure including the risks, benefits and alternatives for the proposed anesthesia with the patient or authorized representative who has indicated his/her understanding and acceptance.     Dental advisory given  Plan Discussed with: CRNA  Anesthesia Plan Comments:         Anesthesia Quick Evaluation

## 2024-03-27 NOTE — Anesthesia Procedure Notes (Signed)
 Procedure Name: LMA Insertion Date/Time: 03/27/2024 8:51 AM  Performed by: Vertie Arthea RAMAN, CRNAPre-anesthesia Checklist: Patient identified, Emergency Drugs available, Suction available and Patient being monitored Patient Re-evaluated:Patient Re-evaluated prior to induction Oxygen Delivery Method: Circle System Utilized Preoxygenation: Pre-oxygenation with 100% oxygen Induction Type: IV induction Ventilation: Mask ventilation without difficulty LMA: LMA inserted LMA Size: 4.0 Number of attempts: 1 Airway Equipment and Method: Bite block Placement Confirmation: positive ETCO2 Tube secured with: Tape Dental Injury: Teeth and Oropharynx as per pre-operative assessment

## 2024-03-27 NOTE — Interval H&P Note (Signed)
 History and Physical Interval Note:  03/27/2024 6:59 AM  Robin Fischer  has presented today for surgery, with the diagnosis of RIGHT KNEE MENISCAL TEAR.  The various methods of treatment have been discussed with the patient and family. After consideration of risks, benefits and other options for treatment, the patient has consented to  Procedure(s) with comments: ARTHROSCOPY, KNEE WITH DEBRIDEMENT (Right) - RIGHT KNEE ARTHROSCOPY WITH DEBRIDEMENT AND DECOMPRESSION as a surgical intervention.  The patient's history has been reviewed, patient examined, no change in status, stable for surgery.  I have reviewed the patient's chart and labs.  Questions were answered to the patient's satisfaction.     Akito Boomhower V Caylin Raby

## 2024-03-28 ENCOUNTER — Encounter (HOSPITAL_COMMUNITY): Payer: Self-pay | Admitting: Orthopedic Surgery

## 2024-03-29 NOTE — Anesthesia Postprocedure Evaluation (Signed)
 Anesthesia Post Note  Patient: Robin Fischer  Procedure(s) Performed: ARTHROSCOPY, KNEE WITH DEBRIDEMENT (Right: Knee)     Patient location during evaluation: PACU Anesthesia Type: General Level of consciousness: awake and alert Pain management: pain level controlled Vital Signs Assessment: post-procedure vital signs reviewed and stable Respiratory status: spontaneous breathing, nonlabored ventilation and respiratory function stable Cardiovascular status: blood pressure returned to baseline and stable Postop Assessment: no apparent nausea or vomiting Anesthetic complications: no   No notable events documented.              Malya Cirillo

## 2024-04-09 ENCOUNTER — Ambulatory Visit (INDEPENDENT_AMBULATORY_CARE_PROVIDER_SITE_OTHER): Admitting: Orthopedic Surgery

## 2024-04-09 DIAGNOSIS — M1711 Unilateral primary osteoarthritis, right knee: Secondary | ICD-10-CM

## 2024-04-10 ENCOUNTER — Encounter: Payer: Self-pay | Admitting: Orthopedic Surgery

## 2024-04-10 NOTE — Progress Notes (Signed)
 Office Visit Note   Patient: Robin Fischer           Date of Birth: [birth year]           MRN: [id number] Visit Date: 04/09/2024              Requested by: [name], NP [address] [place],  [place] [address] PCP: [name], NP  Chief Complaint  Patient presents with   Right Knee - Routine Post Op    03/27/2024 right knee scope       HPI: Patient is a 53 year old woman status post right knee arthroscopy July 23.  Assessment & Plan: Visit Diagnoses:  1. Unilateral primary osteoarthritis, right knee     Plan: Continue to increase activities as tolerated.  Discussed exercise and strengthening.  Follow-Up Instructions: No follow-ups on file.   Ortho Exam  Patient is alert, oriented, no adenopathy, well-dressed, normal affect, normal respiratory effort. Examination patient does have effusion there is no cellulitis portals are clean and dry sutures harvested.    Imaging: No results found. No images are attached to the encounter.  Labs: Lab Results  Component Value Date   HGBA1C 4.9 09/28/2021   HGBA1C 5.0 12/27/2019   LABURIC 5.7 07/25/2023   LABURIC 7.6 (H) 02/06/2023   REPTSTATUS 08/18/2021 FINAL 08/16/2021   CULT >=100,000 COLONIES/mL ESCHERICHIA COLI (A) 08/16/2021   LABORGA ESCHERICHIA COLI (A) 08/16/2021     Lab Results  Component Value Date   ALBUMIN 3.7 03/27/2024   ALBUMIN 4.7 07/25/2023   ALBUMIN 3.2 (L) 09/29/2022    Lab Results  Component Value Date   MG 2.1 09/27/2022   MG 2.3 04/29/2022   MG 2.5 (H) 01/18/2022   Lab Results  Component Value Date   VD25OH 38.1 07/25/2023    No results found for: PREALBUMIN    Latest Ref Rng & Units 03/27/2024    7:28 AM 09/29/2022    5:13 AM 09/28/2022    5:10 AM  CBC EXTENDED  WBC 4.0 - 10.5 K/uL 9.5  7.7  9.1   RBC 3.87 - 5.11 MIL/uL 5.77  4.66  4.56   Hemoglobin 12.0 - 15.0 g/dL 84.0  86.4  86.5   HCT 36.0 - 46.0 % 45.0  37.7  36.6   Platelets 150 - 400 K/uL 242  227   253   NEUT# 1.7 - 7.7 K/uL 4.1   5.6   Lymph# 0.7 - 4.0 K/uL 4.7   2.9      There is no height or weight on file to calculate BMI.  Orders:  No orders of the defined types were placed in this encounter.  No orders of the defined types were placed in this encounter.    Procedures: No procedures performed  Clinical Data: No additional findings.  ROS:  All other systems negative, except as noted in the HPI. Review of Systems  Objective: Vital Signs: There were no vitals taken for this visit.  Specialty Comments:  No specialty comments available.  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   Excessive daytime sleepiness 04/19/2022   Pulmonary emphysema (HCC) 04/19/2022   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HCC) 04/19/2022   Migraine without aura and without status migrainosus, not intractable 02/22/2022   Gross hematuria 09/24/2021   Tonsillopharyngitis 07/28/2020   Acute lower UTI 07/28/2020   Dehydration 05/04/2019   Pancreatitis 05/04/2019   Hypokalemia 08/20/2018   Tobacco abuse 08/20/2018   Leukocytosis 08/20/2018   Alcohol abuse 08/20/2018   Alcoholic pancreatitis 08/20/2018   Abnormal LFTs 08/20/2018   Acute pancreatitis 08/20/2018   Asthma    Hypertension    Past Medical History:  Diagnosis Date   Arthritis    Asthma    Bronchitis    Depression    Dyspnea    occasional with exertion   Emphysema of lung (HCC)    GERD (gastroesophageal  reflux disease)    High cholesterol    Hypertension    Pancreatitis    Pneumonia    x 1   Tobacco abuse     Family History  Problem Relation Age of Onset   Hypertension Mother    Hyperlipidemia Mother    Hypertension Father    Hyperlipidemia Father    Migraines Neg Hx    Headache Neg Hx     Past Surgical History:  Procedure Laterality Date   KNEE ARTHROSCOPY W/ DEBRIDEMENT Right 03/27/2024   Procedure: ARTHROSCOPY, KNEE WITH DEBRIDEMENT;  Surgeon: [name], MD;  Location: [facility] OR;  Service: Orthopedics;  Laterality: Right;  RIGHT KNEE ARTHROSCOPY WITH DEBRIDEMENT AND DECOMPRESSION   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 N/A 09/24/2021   Procedure: CYSTOSCOPY; CLOT EVACUATION; FULGERATION OF URETHRA;  Surgeon: Nieves Cough, MD;  Location: [facility] ORS;  Service: Urology;  Laterality: N/A;   Social History   Occupational History   Not on file  Tobacco Use   Smoking status: Every Day    Current packs/day: 0.50    Types: Cigarettes   Smokeless tobacco: Never  Vaping Use   Vaping status: Never Used  Substance and Sexual Activity   Alcohol use: Yes    Alcohol/week: 14.0 standard drinks of alcohol    Types: 10 Cans of beer, 4 Shots of liquor per week    Comment: beer on weekends   Drug use: Yes    Types: Marijuana    Comment: last use was first of July 2025.  Infomred pt to withhold until after surgery.   Sexual activity: Not Currently    Birth control/protection: Post-menopausal

## 2024-05-07 ENCOUNTER — Encounter: Admitting: Orthopedic Surgery

## 2024-06-16 ENCOUNTER — Emergency Department (HOSPITAL_COMMUNITY)
Admission: EM | Admit: 2024-06-16 | Discharge: 2024-06-16 | Disposition: A | Discharge status: Home / Self Care | Attending: Emergency Medicine | Admitting: Emergency Medicine

## 2024-06-16 ENCOUNTER — Encounter (HOSPITAL_COMMUNITY): Payer: Self-pay

## 2024-06-16 ENCOUNTER — Other Ambulatory Visit: Payer: Self-pay

## 2024-06-16 ENCOUNTER — Emergency Department (HOSPITAL_COMMUNITY)

## 2024-06-16 DIAGNOSIS — Y92410 Unspecified street and highway as the place of occurrence of the external cause: Secondary | ICD-10-CM | POA: Diagnosis not present

## 2024-06-16 DIAGNOSIS — M545 Low back pain, unspecified: Secondary | ICD-10-CM | POA: Insufficient documentation

## 2024-06-16 DIAGNOSIS — M25521 Pain in right elbow: Secondary | ICD-10-CM | POA: Insufficient documentation

## 2024-06-16 DIAGNOSIS — M25512 Pain in left shoulder: Secondary | ICD-10-CM | POA: Diagnosis not present

## 2024-06-16 DIAGNOSIS — M542 Cervicalgia: Secondary | ICD-10-CM | POA: Diagnosis not present

## 2024-06-16 DIAGNOSIS — M7918 Myalgia, other site: Secondary | ICD-10-CM

## 2024-06-16 DIAGNOSIS — R519 Headache, unspecified: Secondary | ICD-10-CM | POA: Insufficient documentation

## 2024-06-16 MED ORDER — CYCLOBENZAPRINE HCL 10 MG PO TABS: 10.0000 mg | ORAL_TABLET | Freq: Two times a day (BID) | ORAL | 0 refills | Status: AC | PRN

## 2024-06-16 MED ORDER — IBUPROFEN 400 MG PO TABS
400.0000 mg | ORAL_TABLET | Freq: Once | ORAL | Status: AC
  Administered 2024-06-16: 400 mg via ORAL
  Filled 2024-06-16: qty 1

## 2024-06-16 MED ORDER — LIDOCAINE 5 % EX PTCH
1.0000 | MEDICATED_PATCH | Freq: Once | CUTANEOUS | Status: DC
  Administered 2024-06-16: 1 via TRANSDERMAL
  Filled 2024-06-16: qty 1

## 2024-06-16 MED ORDER — CYCLOBENZAPRINE HCL 10 MG PO TABS
5.0000 mg | ORAL_TABLET | Freq: Once | ORAL | Status: AC
  Administered 2024-06-16: 5 mg via ORAL
  Filled 2024-06-16: qty 1

## 2024-06-16 MED ORDER — ACETAMINOPHEN 500 MG PO TABS
1000.0000 mg | ORAL_TABLET | Freq: Once | ORAL | Status: AC
  Administered 2024-06-16: 1000 mg via ORAL
  Filled 2024-06-16: qty 2

## 2024-06-16 NOTE — ED Notes (Signed)
 Patient transported to X-ray
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
## 2024-07-01 ENCOUNTER — Other Ambulatory Visit: Payer: Self-pay | Admitting: Orthopedic Surgery

## 2024-07-01 ENCOUNTER — Other Ambulatory Visit (INDEPENDENT_AMBULATORY_CARE_PROVIDER_SITE_OTHER): Payer: Self-pay | Admitting: Primary Care

## 2024-07-01 DIAGNOSIS — Z760 Encounter for issue of repeat prescription: Secondary | ICD-10-CM

## 2024-07-01 DIAGNOSIS — I10 Essential (primary) hypertension: Secondary | ICD-10-CM

## 2024-07-08 ENCOUNTER — Encounter: Payer: Self-pay | Admitting: Radiology

## 2024-07-18 ENCOUNTER — Ambulatory Visit (INDEPENDENT_AMBULATORY_CARE_PROVIDER_SITE_OTHER): Admitting: Physician Assistant

## 2024-07-18 ENCOUNTER — Other Ambulatory Visit: Payer: Self-pay

## 2024-07-18 ENCOUNTER — Encounter: Payer: Self-pay | Admitting: Physician Assistant

## 2024-07-18 DIAGNOSIS — G8929 Other chronic pain: Secondary | ICD-10-CM | POA: Diagnosis not present

## 2024-07-18 DIAGNOSIS — M1711 Unilateral primary osteoarthritis, right knee: Secondary | ICD-10-CM

## 2024-07-18 DIAGNOSIS — M25561 Pain in right knee: Secondary | ICD-10-CM

## 2024-07-18 MED ORDER — METHYLPREDNISOLONE ACETATE 40 MG/ML IJ SUSP
40.0000 mg | INTRAMUSCULAR | Status: AC | PRN
  Administered 2024-07-18: 40 mg via INTRA_ARTICULAR

## 2024-07-18 MED ORDER — LIDOCAINE HCL (PF) 1 % IJ SOLN
5.0000 mL | INTRAMUSCULAR | Status: AC | PRN
  Administered 2024-07-18: 5 mL

## 2024-07-18 NOTE — Progress Notes (Signed)
 Office Visit Note   Patient: Robin Fischer           Date of Birth: [birth year]           MRN: [id number] Visit Date: 07/18/2024              Requested by: [name], NP [address] [place],  [place] [address] PCP: [name], NP  Chief Complaint  Patient presents with   Right Knee - Pain      HPI: 53 y/o female with chronic OA of the right knee as well as meniscus tears s/p knee arthroscopy with partial excision of the medial and lateral meniscal tears. Abrasion chondroplasty of the medial femoral condyle and lateral femoral condyle right knee on 03/27/24.    She still has pain and it is interfering with daily activity.  She states she has trouble bending her knee more than 90 degrees.  She has popping with activity.  Assessment & Plan: Visit Diagnoses:  1. Chronic pain of right knee     Plan: right knee steroid injection.  Home exercise program.  She can have repeat injections every 3-4 months as needed.  If she gets to a point when the injections don't help she will need a right total knee replacement.    Follow-Up Instructions: Return if symptoms worsen or fail to improve.   Ortho Exam  Patient is alert, oriented, no adenopathy, well-dressed, normal affect, normal respiratory effort. Right knee crepitus with active motion of flexion and extension.  Medial and later jit line tenderness.  Mild swelling compared to the left knee without fluctuance or cellulitis.  Palpable pedal pulse.      Imaging: Mild valgus knee with OA loss of joint space laterally > medially.    Labs: Lab Results  Component Value Date   HGBA1C 4.9 09/28/2021   HGBA1C 5.0 12/27/2019   LABURIC 5.7 07/25/2023   LABURIC 7.6 (H) 02/06/2023   REPTSTATUS 08/18/2021 FINAL 08/16/2021   CULT >=100,000 COLONIES/mL ESCHERICHIA COLI (A) 08/16/2021   LABORGA ESCHERICHIA COLI (A) 08/16/2021     Lab Results  Component Value Date   ALBUMIN 3.7 03/27/2024   ALBUMIN 4.7  07/25/2023   ALBUMIN 3.2 (L) 09/29/2022    Lab Results  Component Value Date   MG 2.1 09/27/2022   MG 2.3 04/29/2022   MG 2.5 (H) 01/18/2022   Lab Results  Component Value Date   VD25OH 38.1 07/25/2023    No results found for: PREALBUMIN    Latest Ref Rng & Units 03/27/2024    7:28 AM 09/29/2022    5:13 AM 09/28/2022    5:10 AM  CBC EXTENDED  WBC 4.0 - 10.5 K/uL 9.5  7.7  9.1   RBC 3.87 - 5.11 MIL/uL 5.77  4.66  4.56   Hemoglobin 12.0 - 15.0 g/dL 84.0  86.4  86.5   HCT 36.0 - 46.0 % 45.0  37.7  36.6   Platelets 150 - 400 K/uL 242  227  253   NEUT# 1.7 - 7.7 K/uL 4.1   5.6   Lymph# 0.7 - 4.0 K/uL 4.7   2.9      There is no height or weight on file to calculate BMI.  Orders:  Orders Placed This Encounter  Procedures   Large Joint Inj: R knee   XR Knee 1-2 Views Right   No orders of the defined types were placed in this encounter.    Procedures: Large Joint Inj:  R knee on 07/18/2024 1:48 PM Indications: pain and diagnostic evaluation Details: 22 G 1.5 in needle  Arthrogram: No  Medications: 5 mL lidocaine  (PF) 1 %; 40 mg methylPREDNISolone  acetate 40 MG/ML Outcome: tolerated well, no immediate complications Procedure, treatment alternatives, risks and benefits explained, specific risks discussed. Consent was given by the patient. Immediately prior to procedure a time out was called to verify the correct patient, procedure, equipment, support staff and site/side marked as required. Patient was prepped and draped in the usual sterile fashion.      Clinical Data: No additional findings.  ROS:  All other systems negative, except as noted in the HPI. Review of Systems  Objective: Vital Signs: There were no vitals taken for this visit.  Specialty Comments:  No specialty comments available.  PMFS History: Patient Active Problem List   Diagnosis Date Noted   Peripheral tear of medial meniscus of right knee as current injury 03/27/2024   Complex tear of  lat mensc, current injury, right knee, init 03/27/2024   Pain of knee joint with osteochondral injury 03/27/2024   Hyponatremia 09/28/2022   Hepatitis 09/28/2022   AKI (acute kidney injury) 09/28/2022   Acute gastritis 09/27/2022   Atrial fibrillation (HCC) 09/27/2022   Chronic migraine with aura 04/19/2022   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   Excessive daytime sleepiness 04/19/2022   Pulmonary emphysema (HCC) 04/19/2022   Intra-aortic calcification 04/19/2022   Migraine without aura and without status migrainosus, not intractable 02/22/2022   Gross hematuria 09/24/2021   Tonsillopharyngitis 07/28/2020   Acute lower UTI 07/28/2020   Dehydration 05/04/2019   Pancreatitis 05/04/2019   Hypokalemia 08/20/2018   Tobacco abuse 08/20/2018   Leukocytosis 08/20/2018   Alcohol abuse 08/20/2018   Alcoholic pancreatitis 08/20/2018   Abnormal LFTs 08/20/2018   Acute pancreatitis 08/20/2018   Asthma    Hypertension    Past Medical History:  Diagnosis Date   Arthritis    Asthma    Bronchitis    Depression    Dyspnea    occasional with exertion   Emphysema of lung (HCC)    GERD (gastroesophageal reflux disease)    High cholesterol    Hypertension    Pancreatitis    Pneumonia    x 1   Tobacco abuse     Family History  Problem Relation Age of Onset   Hypertension Mother    Hyperlipidemia Mother    Hypertension Father    Hyperlipidemia Father    Migraines Neg Hx    Headache Neg Hx     Past Surgical History:  Procedure Laterality Date   KNEE ARTHROSCOPY W/ DEBRIDEMENT Right 03/27/2024   Procedure: ARTHROSCOPY, KNEE WITH DEBRIDEMENT;  Surgeon: [name], MD;  Location: [facility] OR;  Service: Orthopedics;  Laterality: Right;  RIGHT KNEE ARTHROSCOPY WITH DEBRIDEMENT AND DECOMPRESSION   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 N/A 09/24/2021   Procedure: CYSTOSCOPY; CLOT EVACUATION; FULGERATION OF URETHRA;  Surgeon:  Nieves Cough, MD;  Location: [facility] ORS;  Service: Urology;  Laterality: N/A;   Social History   Occupational History   Not on file  Tobacco Use   Smoking status: Every Day    Current packs/day: 0.50    Types: Cigarettes   Smokeless tobacco: Never  Vaping Use   Vaping status: Never Used  Substance and Sexual Activity   Alcohol use: Yes    Alcohol/week: 14.0 standard drinks of alcohol    Types: 10 Cans of beer, 4 Shots  of liquor per week    Comment: beer on weekends   Drug use: Yes    Types: Marijuana    Comment: last use was first of July 2025.  Infomred pt to withhold until after surgery.   Sexual activity: Not Currently    Birth control/protection: Post-menopausal

## 2024-07-22 ENCOUNTER — Telehealth (INDEPENDENT_AMBULATORY_CARE_PROVIDER_SITE_OTHER): Payer: Self-pay | Admitting: Primary Care

## 2024-07-22 NOTE — Telephone Encounter (Signed)
 Called pt to confirm appt. Pt will be present.

## 2024-07-23 ENCOUNTER — Encounter (INDEPENDENT_AMBULATORY_CARE_PROVIDER_SITE_OTHER): Payer: Self-pay | Admitting: Primary Care

## 2024-07-23 ENCOUNTER — Ambulatory Visit (INDEPENDENT_AMBULATORY_CARE_PROVIDER_SITE_OTHER): Admitting: Primary Care

## 2024-07-23 VITALS — BP 123/81 | HR 82 | Resp 16 | Wt 226.4 lb

## 2024-07-23 DIAGNOSIS — Z760 Encounter for issue of repeat prescription: Secondary | ICD-10-CM

## 2024-07-23 DIAGNOSIS — N393 Stress incontinence (female) (male): Secondary | ICD-10-CM

## 2024-07-23 DIAGNOSIS — R300 Dysuria: Secondary | ICD-10-CM

## 2024-07-23 DIAGNOSIS — I10 Essential (primary) hypertension: Secondary | ICD-10-CM

## 2024-07-23 MED ORDER — PHENAZOPYRIDINE HCL 100 MG PO TABS: 100.0000 mg | ORAL_TABLET | Freq: Three times a day (TID) | ORAL | 0 refills | Status: AC | PRN
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
## 2024-07-24 ENCOUNTER — Telehealth: Payer: Self-pay

## 2024-07-24 NOTE — Telephone Encounter (Signed)
 Copied from CRM [id number]. Topic: General - Other >> Jul 24, 2024 11:27 AM Burnard DEL wrote: Reason for CRM: Patient called in stating that [facility] faxed over some information to be completed by provider for her to get her urinary supplies. Patient stated that that sent her a text message saying they are waiting on provider to complete information and send back.

## 2024-07-25 NOTE — Telephone Encounter (Signed)
 Have not seen any paperwork for incontinence supplies

## 2024-07-29 ENCOUNTER — Telehealth: Payer: Self-pay

## 2024-07-29 ENCOUNTER — Other Ambulatory Visit (INDEPENDENT_AMBULATORY_CARE_PROVIDER_SITE_OTHER): Payer: Self-pay | Admitting: Primary Care

## 2024-07-29 DIAGNOSIS — R300 Dysuria: Secondary | ICD-10-CM

## 2024-07-29 DIAGNOSIS — Z760 Encounter for issue of repeat prescription: Secondary | ICD-10-CM

## 2024-07-29 DIAGNOSIS — I10 Essential (primary) hypertension: Secondary | ICD-10-CM

## 2024-07-29 NOTE — Telephone Encounter (Signed)
 Returned pt call and made aware that provider sent in Pyridium . Pt asked about her other medication and if everything was sent in order for her to get her incontinence supplies. Made pt aware that I am waiting for provider to finish note and once completed I will fax over

## 2024-07-29 NOTE — Telephone Encounter (Signed)
 Will forward to provider
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
## 2024-07-30 MED ORDER — VENTOLIN HFA 108 (90 BASE) MCG/ACT IN AERS: 2.0000 | INHALATION_SPRAY | Freq: Four times a day (QID) | RESPIRATORY_TRACT | 2 refills | Status: DC | PRN

## 2024-07-30 MED ORDER — VALSARTAN-HYDROCHLOROTHIAZIDE 160-25 MG PO TABS: 1.0000 | ORAL_TABLET | Freq: Every day | ORAL | 3 refills | Status: DC

## 2024-07-31 NOTE — Telephone Encounter (Signed)
 Office notes has been sent to [facility]

## 2024-08-07 ENCOUNTER — Telehealth: Payer: Self-pay

## 2024-08-07 NOTE — Telephone Encounter (Signed)
 Copied from CRM [id number]. Topic: Referral - Question >> Aug 07, 2024  1:38 PM Everette C wrote: Reason for CRM: The patient has been contacted by [facility] and told that their prescription for incontinence supplies needs to be submitted by their PCP. The patient has requested to be contacted by a member of clinical staff when possible to confirm submission.

## 2024-08-08 NOTE — Telephone Encounter (Signed)
 [facility] order came through on 08/06/24 provider will sign on admin day and it will be faxed back to [facility] once completed  Reached out to pt and made aware. Pt doesn't have any questions or concerns

## 2024-08-14 NOTE — Telephone Encounter (Signed)
 Patient received [facility] today informing her that they have not received paperwork about supplies.   Requesting callback: [phone]

## 2024-08-22 ENCOUNTER — Ambulatory Visit (INDEPENDENT_AMBULATORY_CARE_PROVIDER_SITE_OTHER): Admitting: Primary Care

## 2024-08-22 ENCOUNTER — Encounter (INDEPENDENT_AMBULATORY_CARE_PROVIDER_SITE_OTHER): Payer: Self-pay | Admitting: Primary Care

## 2024-08-22 ENCOUNTER — Other Ambulatory Visit (INDEPENDENT_AMBULATORY_CARE_PROVIDER_SITE_OTHER): Payer: Self-pay | Admitting: Primary Care

## 2024-08-22 ENCOUNTER — Telehealth (INDEPENDENT_AMBULATORY_CARE_PROVIDER_SITE_OTHER): Payer: Self-pay | Admitting: Primary Care

## 2024-08-22 VITALS — BP 114/75 | HR 87 | Resp 16 | Wt 229.0 lb

## 2024-08-22 DIAGNOSIS — Z0000 Encounter for general adult medical examination without abnormal findings: Secondary | ICD-10-CM | POA: Diagnosis not present

## 2024-08-22 DIAGNOSIS — J320 Chronic maxillary sinusitis: Secondary | ICD-10-CM

## 2024-08-22 MED ORDER — FLUCONAZOLE 150 MG PO TABS: 150.0000 mg | ORAL_TABLET | Freq: Every day | ORAL | 1 refills | Status: DC

## 2024-08-22 MED ORDER — AMOXICILLIN-POT CLAVULANATE 875-125 MG PO TABS: 1.0000 | ORAL_TABLET | Freq: Two times a day (BID) | ORAL | 0 refills | Status: AC

## 2024-08-22 NOTE — Telephone Encounter (Signed)
 Copied from CRM #[id number]. Topic: General - Other >> Aug 21, 2024 11:07 AM Vena HERO wrote: Reason for CRM: Dawanna from [facility] called in today to request we refax the forms we sent. She says both forms are missing the date so if we could correct that and send back. Call back number is [phone]
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
## 2024-08-27 NOTE — Telephone Encounter (Signed)
 Forms has been faxed today

## 2024-09-16 ENCOUNTER — Telehealth (INDEPENDENT_AMBULATORY_CARE_PROVIDER_SITE_OTHER): Payer: Self-pay | Admitting: Primary Care

## 2024-09-16 ENCOUNTER — Other Ambulatory Visit (INDEPENDENT_AMBULATORY_CARE_PROVIDER_SITE_OTHER): Payer: Self-pay | Admitting: Primary Care

## 2024-09-16 DIAGNOSIS — Z760 Encounter for issue of repeat prescription: Secondary | ICD-10-CM

## 2024-09-16 DIAGNOSIS — J320 Chronic maxillary sinusitis: Secondary | ICD-10-CM

## 2024-09-16 NOTE — Telephone Encounter (Signed)
 Copied from CRM [id number]. Topic: Clinical - Prescription Issue >> Sep 10, 2024 10:38 AM Lonell PEDLAR wrote: Reason for CRM:  Patient is requesting a refill on medical supplies for urinary incontinence. Patient stated that that sent her a text message saying they are waiting on provider to complete information and send back. Patient states that the original order is missing the date. Please review and advise. >> Sep 11, 2024  9:16 AM Nurse Lionel NOVAK, RN wrote: Provider and patient NOT at this office - we are [employer] she works at [employer]  Thanks

## 2024-09-16 NOTE — Telephone Encounter (Signed)
 Reached out to [employer] and they didn't receive signed forms Forms has been faxed to [employer]

## 2024-09-17 NOTE — Telephone Encounter (Signed)
 Requested medication (s) are due for refill today:   Requested medication (s) are on the active medication list: Yes  Last refill:  Diflucan  - 08/22/24  Future visit scheduled: Yes  Notes to clinic:  See requests.    Requested Prescriptions  Pending Prescriptions Disp Refills   fluconazole  (DIFLUCAN ) 150 MG tablet [Pharmacy Med Name: FLUCONAZOLE  150 MG ORAL TABLET] 1 tablet 1    Sig: TAKE 1 TABLET (150 MG TOTAL) BY MOUTH DAILY.     Off-Protocol Failed - 09/17/2024 11:34 AM      Failed - Medication not assigned to a protocol, review manually.      Passed - Valid encounter within last 12 months    Recent Outpatient Visits           3 weeks ago PE (physical exam), annual   [place] [facility] [name], NP   1 month ago Dysuria   [place] [facility] [name], NP   1 year ago Pain and swelling of right knee   [place] [facility] [name], NP   1 year ago Primary hypertension   [place] [facility] [name], NP   1 year ago Encounter for immunization   [place] [facility] [name], NP               famotidine  (PEPCID ) 20 MG tablet [Pharmacy Med Name: FAMOTIDINE  20 MG ORAL TABLET] 120 tablet 0    Sig: TAKE 1 TABLET (20 MG TOTAL) BY MOUTH DAILY. MAY TAKE A SECOND 20 MG DOSE AS NEEDED FOR HEARTBURN     Gastroenterology:  H2 Antagonists Passed - 09/17/2024 11:34 AM      Passed - Valid encounter within last 12 months    Recent Outpatient Visits           3 weeks ago PE (physical exam), annual   [place] [facility] [name], NP   1 month ago Dysuria   [place] [facility] [name], NP   1 year ago Pain and swelling of right knee   [place] [facility] [name], NP   1 year ago Primary hypertension   [place] [facility] [name], NP   1 year ago Encounter for immunization   [place] [facility] [name], NP              Signed Prescriptions Disp Refills   albuterol  (VENTOLIN  HFA) 108 (90 Base) MCG/ACT inhaler 18 g 2    Sig: INHALE 2 PUFFS INTO THE LUNGS EVERY 6 (SIX) HOURS AS NEEDED FOR WHEEZING OR SHORTNESS OF BREATH.     Pulmonology:  Beta Agonists 2 Passed - 09/17/2024 11:34 AM      Passed - Last BP in normal range    BP Readings from Last 1 Encounters:  08/22/24 114/75         Passed - Last Heart Rate in normal range    Pulse Readings from Last 1 Encounters:  08/22/24 87         Passed - Valid encounter within last 12 months    Recent Outpatient Visits           3 weeks ago PE (physical exam), annual   [place] [facility] [name], NP   1 month ago Dysuria   [place] [facility]  [name], NP   1 year ago Pain and swelling of right knee   [place] [facility] [name], NP   1 year ago Primary hypertension   [place] [facility] [name], NP   1 year ago Encounter for immunization   [place] [facility] [name], NP

## 2024-09-17 NOTE — Telephone Encounter (Signed)
 Requested Prescriptions  Pending Prescriptions Disp Refills   fluconazole  (DIFLUCAN ) 150 MG tablet [Pharmacy Med Name: FLUCONAZOLE  150 MG ORAL TABLET] 1 tablet 1    Sig: TAKE 1 TABLET (150 MG TOTAL) BY MOUTH DAILY.     Off-Protocol Failed - 09/17/2024 11:33 AM      Failed - Medication not assigned to a protocol, review manually.      Passed - Valid encounter within last 12 months    Recent Outpatient Visits           3 weeks ago PE (physical exam), annual   [place] [facility] [name], NP   1 month ago Dysuria   [place] [facility] [name], NP   1 year ago Pain and swelling of right knee   [place] [facility] [name], NP   1 year ago Primary hypertension   [place] [facility] [name], NP   1 year ago Encounter for immunization   [place] [facility] [name], NP               albuterol  (VENTOLIN  HFA) 108 (90 Base) MCG/ACT inhaler [Pharmacy Med Name: ALBUTEROL  SULFATE HFA 108 (90 BASE) MCG/ACT INHALATION AEROSOL SOLUTION] 18 g 2    Sig: INHALE 2 PUFFS INTO THE LUNGS EVERY 6 (SIX) HOURS AS NEEDED FOR WHEEZING OR SHORTNESS OF BREATH.     Pulmonology:  Beta Agonists 2 Passed - 09/17/2024 11:33 AM      Passed - Last BP in normal range    BP Readings from Last 1 Encounters:  08/22/24 114/75         Passed - Last Heart Rate in normal range    Pulse Readings from Last 1 Encounters:  08/22/24 87         Passed - Valid encounter within last 12 months    Recent Outpatient Visits           3 weeks ago PE (physical exam), annual   [place] [facility] [name], NP   1 month ago Dysuria   [place] [facility] [name], NP   1 year ago Pain and swelling of right knee   [place] [facility] [name], NP   1 year ago Primary hypertension    [place] [facility] [name], NP   1 year ago Encounter for immunization   [place] [facility] [name], NP               famotidine  (PEPCID ) 20 MG tablet [Pharmacy Med Name: FAMOTIDINE  20 MG ORAL TABLET] 120 tablet 0    Sig: TAKE 1 TABLET (20 MG TOTAL) BY MOUTH DAILY. MAY TAKE A SECOND 20 MG DOSE AS NEEDED FOR HEARTBURN     Gastroenterology:  H2 Antagonists Passed - 09/17/2024 11:33 AM      Passed - Valid encounter within last 12 months    Recent Outpatient Visits           3 weeks ago PE (physical exam), annual   [place] [facility] [name], NP   1 month ago Dysuria   [place] [facility] [name], NP   1 year ago Pain and swelling of right knee    [facility] [name], NP   1 year ago Primary hypertension  [place] [facility] [name], NP   1 year ago Encounter for immunization   [facility] [name], NP

## 2024-10-03 ENCOUNTER — Telehealth (INDEPENDENT_AMBULATORY_CARE_PROVIDER_SITE_OTHER): Payer: Self-pay | Admitting: Primary Care

## 2024-10-03 NOTE — Telephone Encounter (Signed)
 Error

## 2024-10-03 NOTE — Telephone Encounter (Signed)
 Called pt to confirm appt. Pt stated that she did not have transportation. I will be arranging that for pt to make it to appt.

## 2024-10-04 ENCOUNTER — Ambulatory Visit (INDEPENDENT_AMBULATORY_CARE_PROVIDER_SITE_OTHER): Payer: Self-pay | Admitting: Primary Care

## 2024-10-04 NOTE — Telephone Encounter (Signed)
 Received new order form for incontinence reached out to [employer] and spoke with Bridgette and she stated pt requested a size change so that's why.   Will give order to provider for signature and will fax

## 2024-10-11 ENCOUNTER — Telehealth (INDEPENDENT_AMBULATORY_CARE_PROVIDER_SITE_OTHER): Payer: Self-pay | Admitting: Primary Care

## 2024-10-11 NOTE — Telephone Encounter (Signed)
 Order was faxed today.

## 2024-10-11 NOTE — Telephone Encounter (Signed)
 Copied from CRM #[id number]. Topic: Clinical - Order For Equipment >> Oct 11, 2024 10:44 AM Nathanel BROCKS wrote: Reason for CRM: pt needs you to send a rx to [employer] for supplies, they are saying that they have not gotten anything yet. Pull ons, size large.
